# Patient Record
Sex: Male | Born: 1940
Health system: Southern US, Community
[De-identification: ages and names within clinical notes are randomized; demographics above are authoritative.]

## PROBLEM LIST (undated history)

## (undated) DIAGNOSIS — E785 Hyperlipidemia, unspecified: Secondary | ICD-10-CM

## (undated) DIAGNOSIS — I10 Essential (primary) hypertension: Secondary | ICD-10-CM

## (undated) DIAGNOSIS — E119 Type 2 diabetes mellitus without complications: Secondary | ICD-10-CM

---

## 2008-11-10 ENCOUNTER — Emergency Department: Payer: Self-pay | Admitting: Emergency Medicine

## 2009-09-13 ENCOUNTER — Ambulatory Visit: Payer: Self-pay | Admitting: Unknown Physician Specialty

## 2010-06-21 ENCOUNTER — Ambulatory Visit: Payer: Self-pay | Admitting: Anesthesiology

## 2010-07-14 ENCOUNTER — Ambulatory Visit: Payer: Self-pay | Admitting: Anesthesiology

## 2010-08-17 ENCOUNTER — Ambulatory Visit: Payer: Self-pay | Admitting: Anesthesiology

## 2010-10-26 ENCOUNTER — Ambulatory Visit: Payer: Self-pay | Admitting: Anesthesiology

## 2010-11-23 ENCOUNTER — Ambulatory Visit: Payer: Self-pay | Admitting: Anesthesiology

## 2010-12-22 ENCOUNTER — Ambulatory Visit: Payer: Self-pay | Admitting: Anesthesiology

## 2011-01-13 ENCOUNTER — Ambulatory Visit: Payer: Self-pay | Admitting: Pain Medicine

## 2011-01-26 ENCOUNTER — Ambulatory Visit: Payer: Self-pay | Admitting: Pain Medicine

## 2011-08-11 ENCOUNTER — Observation Stay: Payer: Self-pay | Admitting: Internal Medicine

## 2011-10-19 ENCOUNTER — Ambulatory Visit: Payer: Self-pay | Admitting: Pain Medicine

## 2011-11-23 ENCOUNTER — Ambulatory Visit: Payer: Self-pay | Admitting: Pain Medicine

## 2011-11-28 ENCOUNTER — Emergency Department: Payer: Self-pay | Admitting: Emergency Medicine

## 2011-11-28 LAB — DRUG SCREEN, URINE
Amphetamines, Ur Screen: NEGATIVE (ref ?–1000)
Barbiturates, Ur Screen: NEGATIVE (ref ?–200)
Cannabinoid 50 Ng, Ur ~~LOC~~: NEGATIVE (ref ?–50)
Cocaine Metabolite,Ur ~~LOC~~: NEGATIVE (ref ?–300)
MDMA (Ecstasy)Ur Screen: NEGATIVE (ref ?–500)
Opiate, Ur Screen: NEGATIVE (ref ?–300)
Phencyclidine (PCP) Ur S: NEGATIVE (ref ?–25)

## 2011-11-28 LAB — COMPREHENSIVE METABOLIC PANEL
Albumin: 4 g/dL (ref 3.4–5.0)
Alkaline Phosphatase: 68 U/L (ref 50–136)
Calcium, Total: 8.9 mg/dL (ref 8.5–10.1)
Chloride: 103 mmol/L (ref 98–107)
Creatinine: 1.1 mg/dL (ref 0.60–1.30)
Glucose: 139 mg/dL — ABNORMAL HIGH (ref 65–99)
SGOT(AST): 19 U/L (ref 15–37)
Total Protein: 7.5 g/dL (ref 6.4–8.2)

## 2011-11-28 LAB — CBC
HGB: 12.3 g/dL — ABNORMAL LOW (ref 13.0–18.0)
MCHC: 33.4 g/dL (ref 32.0–36.0)
Platelet: 377 10*3/uL (ref 150–440)
RDW: 14.9 % — ABNORMAL HIGH (ref 11.5–14.5)
WBC: 9.9 10*3/uL (ref 3.8–10.6)

## 2011-11-28 LAB — PRO B NATRIURETIC PEPTIDE: B-Type Natriuretic Peptide: 42 pg/mL (ref 0–125)

## 2011-11-28 LAB — CK TOTAL AND CKMB (NOT AT ARMC): CK, Total: 89 U/L (ref 35–232)

## 2011-11-28 LAB — TROPONIN I: Troponin-I: <0.02 ng/mL

## 2012-06-06 ENCOUNTER — Ambulatory Visit: Payer: Self-pay | Admitting: Urology

## 2012-06-11 ENCOUNTER — Ambulatory Visit: Payer: Self-pay | Admitting: Urology

## 2012-06-14 ENCOUNTER — Inpatient Hospital Stay: Payer: Self-pay | Admitting: Family Medicine

## 2012-06-14 LAB — URINALYSIS, COMPLETE
Bilirubin,UR: NEGATIVE
Ph: 6 (ref 4.5–8.0)
Protein: 100
RBC,UR: 663 /HPF (ref 0–5)
Specific Gravity: 1.017 (ref 1.003–1.030)
Squamous Epithelial: NONE SEEN

## 2012-06-14 LAB — COMPREHENSIVE METABOLIC PANEL
Albumin: 3.5 g/dL (ref 3.4–5.0)
Anion Gap: 11 (ref 7–16)
BUN: 19 mg/dL — ABNORMAL HIGH (ref 7–18)
Bilirubin,Total: 0.5 mg/dL (ref 0.2–1.0)
Creatinine: 1.43 mg/dL — ABNORMAL HIGH (ref 0.60–1.30)
EGFR (African American): 57 — ABNORMAL LOW
Glucose: 220 mg/dL — ABNORMAL HIGH (ref 65–99)
Osmolality: 285 (ref 275–301)
Potassium: 4.5 mmol/L (ref 3.5–5.1)
Sodium: 138 mmol/L (ref 136–145)
Total Protein: 7.4 g/dL (ref 6.4–8.2)

## 2012-06-14 LAB — CK TOTAL AND CKMB (NOT AT ARMC): CK-MB: <0.5 ng/mL — ABNORMAL LOW (ref 0.5–3.6)

## 2012-06-14 LAB — LIPASE, BLOOD: Lipase: 217 U/L (ref 73–393)

## 2012-06-14 LAB — CBC
HGB: 10.7 g/dL — ABNORMAL LOW (ref 13.0–18.0)
MCV: 88 fL (ref 80–100)
Platelet: 288 10*3/uL (ref 150–440)
RBC: 3.69 10*6/uL — ABNORMAL LOW (ref 4.40–5.90)
WBC: 14.2 10*3/uL — ABNORMAL HIGH (ref 3.8–10.6)

## 2012-06-15 ENCOUNTER — Ambulatory Visit: Payer: Self-pay | Admitting: Hematology and Oncology

## 2012-06-15 LAB — BASIC METABOLIC PANEL
Anion Gap: 13 (ref 7–16)
Calcium, Total: 7.4 mg/dL — ABNORMAL LOW (ref 8.5–10.1)
Chloride: 105 mmol/L (ref 98–107)
Co2: 20 mmol/L — ABNORMAL LOW (ref 21–32)
Glucose: 257 mg/dL — ABNORMAL HIGH (ref 65–99)
Osmolality: 286 (ref 275–301)
Potassium: 3.7 mmol/L (ref 3.5–5.1)
Sodium: 138 mmol/L (ref 136–145)

## 2012-06-16 ENCOUNTER — Ambulatory Visit: Payer: Self-pay | Admitting: Urology

## 2012-06-16 LAB — CBC WITH DIFFERENTIAL/PLATELET
Basophil %: 0.4 %
Eosinophil #: 0.1 10*3/uL (ref 0.0–0.7)
Eosinophil %: 1.7 %
HCT: 29.3 % — ABNORMAL LOW (ref 40.0–52.0)
HGB: 10 g/dL — ABNORMAL LOW (ref 13.0–18.0)
Lymphocyte #: 1.1 10*3/uL (ref 1.0–3.6)
MCH: 29.9 pg (ref 26.0–34.0)
MCHC: 34.1 g/dL (ref 32.0–36.0)
MCV: 88 fL (ref 80–100)
Monocyte %: 12.5 %
Neutrophil #: 5.3 10*3/uL (ref 1.4–6.5)
Platelet: 235 10*3/uL (ref 150–440)
RBC: 3.34 10*6/uL — ABNORMAL LOW (ref 4.40–5.90)
RDW: 13.1 % (ref 11.5–14.5)
WBC: 7.6 10*3/uL (ref 3.8–10.6)

## 2012-06-16 LAB — BASIC METABOLIC PANEL
Anion Gap: 8 (ref 7–16)
BUN: 10 mg/dL (ref 7–18)
Calcium, Total: 7.9 mg/dL — ABNORMAL LOW (ref 8.5–10.1)
Creatinine: 0.99 mg/dL (ref 0.60–1.30)
Glucose: 126 mg/dL — ABNORMAL HIGH (ref 65–99)
Osmolality: 276 (ref 275–301)
Potassium: 3.4 mmol/L — ABNORMAL LOW (ref 3.5–5.1)
Sodium: 138 mmol/L (ref 136–145)

## 2012-06-17 LAB — CBC WITH DIFFERENTIAL/PLATELET
Basophil #: 0 10*3/uL (ref 0.0–0.1)
Basophil %: 0.5 %
Eosinophil #: 0.1 10*3/uL (ref 0.0–0.7)
HGB: 10.1 g/dL — ABNORMAL LOW (ref 13.0–18.0)
Lymphocyte %: 24.7 %
MCHC: 35.1 g/dL (ref 32.0–36.0)
Monocyte %: 10.4 %
Neutrophil #: 4.7 10*3/uL (ref 1.4–6.5)
Neutrophil %: 62.5 %
WBC: 7.4 10*3/uL (ref 3.8–10.6)

## 2012-06-17 LAB — BASIC METABOLIC PANEL
Calcium, Total: 8.1 mg/dL — ABNORMAL LOW (ref 8.5–10.1)
Co2: 26 mmol/L (ref 21–32)
Osmolality: 285 (ref 275–301)
Potassium: 3.4 mmol/L — ABNORMAL LOW (ref 3.5–5.1)
Sodium: 141 mmol/L (ref 136–145)

## 2012-06-17 LAB — VANCOMYCIN, TROUGH: Vancomycin, Trough: 13 ug/mL (ref 10–20)

## 2012-06-18 LAB — CBC WITH DIFFERENTIAL/PLATELET
Eosinophil: 2 %
HCT: 28.5 % — ABNORMAL LOW (ref 40.0–52.0)
HGB: 8.9 g/dL — ABNORMAL LOW (ref 13.0–18.0)
Lymphocytes: 30 %
MCV: 88 fL (ref 80–100)
Monocytes: 5 %
RBC: 3.25 10*6/uL — ABNORMAL LOW (ref 4.40–5.90)
Variant Lymphocyte - H1-Rlymph: 1 %
WBC: 9.6 10*3/uL (ref 3.8–10.6)

## 2012-06-18 LAB — BASIC METABOLIC PANEL
Anion Gap: 10 (ref 7–16)
BUN: 6 mg/dL — ABNORMAL LOW (ref 7–18)
Chloride: 108 mmol/L — ABNORMAL HIGH (ref 98–107)
Creatinine: 0.93 mg/dL (ref 0.60–1.30)
Glucose: 79 mg/dL (ref 65–99)
Osmolality: 280 (ref 275–301)
Potassium: 3.6 mmol/L (ref 3.5–5.1)
Sodium: 142 mmol/L (ref 136–145)

## 2012-06-18 LAB — CULTURE, BLOOD (SINGLE)

## 2012-06-19 ENCOUNTER — Ambulatory Visit: Payer: Self-pay | Admitting: Hematology and Oncology

## 2012-06-21 LAB — CULTURE, BLOOD (SINGLE)

## 2012-07-06 ENCOUNTER — Ambulatory Visit: Payer: Self-pay | Admitting: Hematology and Oncology

## 2012-08-10 ENCOUNTER — Ambulatory Visit: Payer: Self-pay | Admitting: Hematology and Oncology

## 2012-08-17 ENCOUNTER — Ambulatory Visit: Payer: Self-pay | Admitting: Hematology and Oncology

## 2012-09-05 ENCOUNTER — Ambulatory Visit: Payer: Self-pay | Admitting: Hematology and Oncology

## 2013-02-15 ENCOUNTER — Other Ambulatory Visit: Payer: Self-pay | Admitting: Family Medicine

## 2013-02-18 ENCOUNTER — Other Ambulatory Visit: Payer: Self-pay | Admitting: Family Medicine

## 2013-05-11 ENCOUNTER — Ambulatory Visit: Payer: Self-pay | Admitting: Neurology

## 2013-08-29 ENCOUNTER — Emergency Department: Payer: Self-pay | Admitting: Emergency Medicine

## 2013-08-29 LAB — BASIC METABOLIC PANEL
BUN: 15 mg/dL (ref 7–18)
Calcium, Total: 8.5 mg/dL (ref 8.5–10.1)
Co2: 25 mmol/L (ref 21–32)
Creatinine: 1.19 mg/dL (ref 0.60–1.30)
EGFR (African American): >60
EGFR (Non-African Amer.): >60
Osmolality: 277 (ref 275–301)
Potassium: 3.7 mmol/L (ref 3.5–5.1)

## 2013-08-29 LAB — TROPONIN I: Troponin-I: <0.02 ng/mL

## 2013-08-29 LAB — CBC
HGB: 11.2 g/dL — ABNORMAL LOW (ref 13.0–18.0)
MCHC: 34.1 g/dL (ref 32.0–36.0)
MCV: 87 fL (ref 80–100)
Platelet: 311 10*3/uL (ref 150–440)
RBC: 3.78 10*6/uL — ABNORMAL LOW (ref 4.40–5.90)
WBC: 17 10*3/uL — ABNORMAL HIGH (ref 3.8–10.6)

## 2013-08-31 LAB — BETA STREP CULTURE(ARMC)

## 2013-09-03 LAB — CULTURE, BLOOD (SINGLE)

## 2014-12-15 DIAGNOSIS — E538 Deficiency of other specified B group vitamins: Secondary | ICD-10-CM | POA: Diagnosis not present

## 2014-12-15 DIAGNOSIS — I10 Essential (primary) hypertension: Secondary | ICD-10-CM | POA: Diagnosis not present

## 2014-12-15 DIAGNOSIS — E559 Vitamin D deficiency, unspecified: Secondary | ICD-10-CM | POA: Diagnosis not present

## 2014-12-15 DIAGNOSIS — E039 Hypothyroidism, unspecified: Secondary | ICD-10-CM | POA: Diagnosis not present

## 2014-12-15 DIAGNOSIS — E785 Hyperlipidemia, unspecified: Secondary | ICD-10-CM | POA: Diagnosis not present

## 2014-12-15 DIAGNOSIS — Z79899 Other long term (current) drug therapy: Secondary | ICD-10-CM | POA: Diagnosis not present

## 2014-12-15 DIAGNOSIS — E119 Type 2 diabetes mellitus without complications: Secondary | ICD-10-CM | POA: Diagnosis not present

## 2014-12-23 NOTE — H&P (Signed)
PATIENT NAME:  Brad Santiago, Brad Santiago MR#:  [id number] DATE OF BIRTH:  [birth year]  DATE OF ADMISSION:  06/11/2012  CHIEF COMPLAINT: Difficulty voiding.   HISTORY OF PRESENT ILLNESS: Brad Santiago is a 74 year old white male with a long history of benign prostatic hypertrophy and bladder outlet obstruction. He is currently being managed with Rapaflo one a day. He has not had any improvement with medical therapy. He underwent uroflow study in the office which was consistent with severe obstruction. He also has a history of elevated PSA and underwent needle biopsy May 28th which indicated a 74 gram prostate with benign histology. He comes in now for photovaporization of the prostate with green light laser. The patient underwent medical clearance for this procedure per Dr. Terance HartBronstein on August 12th.   ALLERGIES: The patient is allergic to sulfa drugs.   CURRENT MEDICATIONS:  1. Aspirin. 2. Glyburide. 3. Simvastatin. 4. Lisinopril. 5. Hydrocodone. 6. Clonazepam.  7. Sertraline. 8. Vitamin B12.   PAST SURGICAL HISTORY:  1. Right knee arthroscopy in 1999. 2. TUMT in 2007.   SOCIAL HISTORY: Denied tobacco or alcohol use.   FAMILY HISTORY: Remarkable for parents with diabetes and hypercholesterolemia.   PAST AND CURRENT MEDICAL CONDITIONS:  1. Diabetes. 2. Psoriasis. 3. Allergic rhinitis.  4. Hyperlipidemia.  5. Arthritis.  6. Chronic anemia. 7. Vitamin B12 deficiency. 8. The patient has chronic fatigue, chronic leg pain, and some shortness of breath. He also has occasional numbness in his arms and his legs. He has chronic constipation. He denied chest pain.   PHYSICAL EXAMINATION:   GENERAL: Elderly white male in no acute distress.   HEENT: Sclerae were clear. Pupils were equally round and reactive to light and accommodation. Extraocular movements were intact.   NECK: Supple without palpable adenopathy. No audible carotid bruits.   LUNGS: Clear to auscultation.   CARDIOVASCULAR: Regular rhythm  and rate without audible murmurs.   ABDOMEN: Soft. No palpable abdominal masses.   GU: The patient was uncircumcised. Testes were atrophic, 16 mL in size each.   RECTAL: Greater than 50 gram smooth, nontender prostate.   NEUROMUSCULAR: The patient was alert and oriented x3.   IMPRESSION:  1. Massive benign prostatic hypertrophy with bladder outlet obstruction.  2. Elevated PSA.   PLAN: Photovaporization of the prostate with green light laser.  ____________________________ Brad ConnersMichael R. Evelene CroonWolff, MD mrw:drc D: 06/06/2012 13:46:21 ET T: 06/06/2012 13:59:33 ET JOB#: [id number] Yecheskel Kurek R Ikeya Brockel MD ELECTRONICALLY SIGNED 06/07/2012 9:08

## 2014-12-23 NOTE — Consult Note (Signed)
         Electronic Signatures: Antony Hasteamiah, Donn Zanetti S (MD)  (Signed 14-Oct-13 10:15)  Authored: Brief Consult Note   Last Updated: 14-Oct-13 10:15 by Antony Hasteamiah, Curley Fayette S (MD)

## 2014-12-23 NOTE — Op Note (Signed)
PATIENT NAME:  Brad Santiago, Brad Santiago MR#:  [id number] DATE OF BIRTH:  01-Feb-1941  DATE OF PROCEDURE:  06/11/2012  PREOPERATIVE DIAGNOSIS: Benign prostatic hypertrophy with bladder outlet obstruction.   POSTOPERATIVE DIAGNOSIS: Benign prostatic hypertrophy with bladder outlet obstruction.  PROCEDURE PERFORMED: Photovaporization of the prostate with a green light laser.   SURGEON: Anola GurneyMichael Manvi Guilliams, Brad Santiago   ANESTHETIST: Zena AmosWilliam Kephart, Brad Santiago    ANESTHESIA: General.   INDICATIONS: See the dictated History and Physical. After informed consent, the patient requested the above procedure.   OPERATIVE SUMMARY: After adequate general anesthesia had been attained, the patient was placed into the dorsal lithotomy position, and the perineum was prepped and draped in the usual fashion. The laser scope was coupled with the camera and then visually advanced into the bladder. The bladder was moderately trabeculated. No bladder mucosal lesions were identified. Both ureteral orifices were identified and had clear efflux. The patient was noted to have trilobar benign prostatic hypertrophy. At this point, the XPS laser fiber was introduced through the scope and set at 80 watts of power. The bladder neck tissue was then vaporized. Power was then increased to 120 watts, and the prostatic tissue from the bladder neck to close to the apex was vaporized. Finally, the power was increased to 180 watts and remaining obstructive tissue vaporized from the bladder neck to the verumontanum. At this point, the scope was removed. A 20-French silicone catheter was placed and irrigated until clear. A B and O suppository was placed. The procedure was then terminated, and the patient was transferred to the recovery room in stable condition.  ____________________________ Brad ConnersMichael R. Evelene CroonWolff, Brad Santiago mrw:cbb D: 06/11/2012 11:20:34 ET T: 06/11/2012 11:32:20 ET JOB#: [id number] Brad Santiago ELECTRONICALLY SIGNED 06/11/2012 16:03

## 2014-12-23 NOTE — Consult Note (Signed)
PATIENT NAME:  Brad Santiago, Brad Santiago MR#:  [id number] DATE OF BIRTH:  [birth year]  DATE OF CONSULTATION:  06/14/2012  REFERRING PHYSICIAN:  Maryan Puls, MD of Urology CONSULTING PHYSICIAN:  Dainel Arcidiacono A. Posey Pronto, MD PRIMARY CARE PHYSICIAN: Juluis Pitch, MD   REASON FOR CONSULTATION: Management for medical problems of diabetes and hypertension.   HISTORY OF PRESENT ILLNESS: Mr. Brad Santiago is a 74 year old Caucasian gentleman with history of type 2 diabetes, hypertension, hyperlipidemia, and history of benign prostatic hypertrophy, who recently underwent outpatient procedure by Dr. Maryan Puls on 06/11/2012 where he underwent photovaporization of his prostate due to massive prostate enlargement along with bladder outlet obstruction. The patient was sent home the same day, started having nausea, vomiting, dry heaves and chills along with fever since yesterday. He complained of significant back pain, came to the Emergency Room and was found to have SIRS secondary to acute cystitis/urinary tract infection. The patient received a dose of IV Levaquin. He is admitted on Dr. Letta Kocher service. Internal Medicine was consulted for management of his medical problems. The patient in the Emergency Room was having dry heaves, improved with Zofran, now wanting to eat something and is tolerating liquids in the formal of orange juice. He received a dose of IV Levaquin. He has been complaining of some back pain. No vomiting at present.   HOME MEDICATIONS:   1. Simvastatin 20 mg p.o. daily.  2. Sertraline 50 mg daily.  3. Lisinopril 20 mg daily.  4. Glyburide 5/500, 2 tablets b.i.d.  However, the patient says recently his sugars have been running on the lower side. He is taking 1 tablet b.i.d.   5. Clonazepam 0.5 mg p.o. daily.  6. Betamethasone/Clotrimazole 0.05/1% topical cream to affected area.  7. Aspirin 81 mg daily.  8. Acetaminophen/hydrocodone 10/500, 1 tablet every 6 to 8 hours.   PAST MEDICAL/SURGICAL HISTORY:   1. Psoriasis.  2. Benign prostatic hypertrophy, status post photovaporization with green laser light secondary to massive prostate enlargement along with  bladder outlet obstruction.  3. Hypertension.  4. Hyperlipidemia.  5. Diabetes.  6. Right knee surgery.   ALLERGIES: Penicillin and sulfa.   FAMILY HISTORY: Positive for hypertension.    REVIEW OF SYSTEMS: CONSTITUTIONAL: Positive for fever, fatigue, weakness. EYES: No blurred or double vision. ENT: No tinnitus, ear pain, hearing loss. RESPIRATORY: No cough, wheeze, hemoptysis, or chronic obstructive pulmonary disease. CARDIOVASCULAR: No chest pain, orthopnea, or edema. GASTROINTESTINAL: Positive for nausea, vomiting, abdominal pain and back pain. GENITOURINARY: Positive for burning urine, positive for dysuria and frequency. HEMATOLOGY: No anemia or easy bruising. SKIN: No acne or rash. ENDOCRINE: No polyuria or nocturia. NEUROLOGICAL: No cerebrovascular accident or transient ischemic attack. Positive for weakness. PSYCHIATRIC: No anxiety or depression. All other systems reviewed and negative.   PHYSICAL EXAMINATION:  GENERAL: The patient is awake, alert, oriented x3, in mild distress due to back pain.  VITAL SIGNS: Temperature 98.7, point is 105, blood pressure 141/85, saturations are 93% to 94% on room air.   HEENT: Atraumatic, normocephalic. Pupils are equal, round, and reactive to light and accommodation. Extraocular movements are intact. Oral mucosa is dry. Facial flushing present.   NECK: Supple. No JVD. No carotid bruit.   LUNGS: Clear to auscultation bilaterally. No rales, rhonchi, respiratory distress, or labored breathing.   CARDIOVASCULAR: Both the heart sounds are normal. Rate is tachycardic. No murmur heard. PMI not lateralized. Chest is nontender.   EXTREMITIES: Good pedal pulses, good femoral pulses. No lower extremity edema.   ABDOMEN: Soft, benign, and  nontender. No organomegaly. Positive bowel sounds.  NEUROLOGIC:  Grossly intact cranial nerves II to XII. No motor or sensory deficit.   PSYCHIATRIC: The patient is awake, alert, oriented x3.   SKIN: Warm and dry.   LABORATORY, DIAGNOSTIC AND RADIOLOGICAL DATA: CT of the abdomen shows small retroperitoneal lymph nodes. PET/CT should be considered for further evaluation. Prostate enlargement, benign prostatic hypertrophy or prostatic malignancy could present in this fashion. Urinalysis positive for urinary tract infection. Glucose is 220, BUN is 19, creatinine 1.43, sodium 138, potassium 4.5, chloride 104, bicarbonate 23, calcium 8.6, alkaline phosphatase 65, hemoglobin and hematocrit 10.7 and 32.5. White count is 14.2, platelet count is 288. Cardiac enzymes are negative. Troponin is 0.02. EKG showed sinus tachycardia.   ASSESSMENT/PLAN: Mr. Brad Santiago is a 74 year old with:  1. Type 2 diabetes: We will continue sliding scale since the patient is n.p.o., is tolerating now some clear liquids and having dry heaves. We will resume p.o. medications once able to tolerate.  2. Hypertension: Resume p.o. medications except hold lisinopril. Once creatinine is back to normal resume lisinopril. We will give p.r.n. IV hydralazine if needed for elevated blood pressure.  3. Systemic inflammatory response syndrome secondary to acute cystitis/urinary tract infection, status post photovaporization of the prostate for enlarged prostate 10/07: The patient presented with fever, tachycardia, elevated white count, receiving IV fluids. Follow blood cultures, urine cultures. Management per Dr. Yves Dill.  4. Acute prerenal azotemia: Poor p.o. intake with vomiting. Creatinine at baseline is 1.1. Continue IV fluids and follow up MET-B.  5. Hyperlipidemia: On simvastatin.   6. Deep vein thrombosis prophylaxis: Will recommend heparan subcutaneous t.i.d.  7. Further work-up according to the patient's clinical course.   Thank you for the consult. We will follow while the patient is in house.   TIME  SPENT: 55 minutes.  ____________________________ Hart Rochester Posey Pronto, MD sap:cbb D: 06/14/2012 18:55:38 ET T: 06/15/2012 09:58:14 ET JOB#: [id number] cc: Youlanda Roys. Lovie Macadamia, MD Ilda Basset MD ELECTRONICALLY SIGNED 06/15/2012 21:00

## 2014-12-23 NOTE — H&P (Signed)
PATIENT NAME:  Brad Santiago, Brad Santiago DATE OF BIRTH:  1941-06-21  DATE OF ADMISSION:  06/14/2012  CHIEF COMPLAINT:  Fever, chills, and nausea.  HISTORY OF PRESENT ILLNESS: Mr. Brad Santiago is a 74 year old white male who underwent Green Light photovaporization of the prostate on 10/07. The procedure was uneventful. He was treated with Levaquin for three days perioperatively. His catheter was removed yesterday. He developed nausea and vomiting last night and today, prompting an Emergency Room visit. He has also had some fever and chills today. He reports that he is voiding with a good urinary stream and denied dysuria or gross hematuria. He has not had a bowel movement in seven days.   PAST MEDICAL HISTORY, SOCIAL HISTORY, REVIEW OF SYSTEMS:  See the History and Physical dated 06/11/2012.   PHYSICAL EXAMINATION: Abdomen was soft. Bladder was not distended. No CVA tenderness.  CT scan films and report were reviewed.   IMPRESSION:  1. Fever and chills of uncertain etiology.  2. Diabetes.  3. Benign prostatic hypertrophy with bladder outlet obstruction status post Burna MortimerGreen Light photovaporization of the prostate 10/07.  PLAN:  1. Admit for IV fluids and antibiotics.  2. Obtain blood cultures and urine culture.  3. Consult internal medicine to help manage diabetes and the possibility of bowel movement problems.   ____________________________ Suszanne ConnersMichael R. Evelene CroonWolff, MD mrw:bjt D:  06/14/2012 18:31:43 ET         T: 06/15/2012 06:08:18 ET         JOB#: [id number] Brad Pascua R Djibril Glogowski MD ELECTRONICALLY SIGNED 06/15/2012 9:03

## 2014-12-23 NOTE — Consult Note (Signed)
PCP Dr Mitchell HeirBronsteindr Wolf Dm-2cont SSI since pt is NPO and having dry heavespo meds once able to tolerate HTNpo meds except hold lisinopril S/p Photovaporaization of prostate 10/7 now with UTI/acute cyystitis and SIRStachycardia, elevated WBCdr wolf re-renal azotemiapo intake, vomiting1.1 baseline HL pt and son    Electronic Signatures: Willow OraPatel, Kimberly Nieland A (MD) (Signed on 10-Oct-13 18:29)  Authored   Last Updated: 10-Oct-13 18:31 by Willow OraPatel, Mckinnley Cottier A (MD)

## 2014-12-23 NOTE — Consult Note (Signed)
PATIENT NAME:  Brad Santiago, Brad Santiago MR#:  [id number] DATE OF BIRTH:  [birth year]  DATE OF CONSULTATION:  06/15/2012  REFERRING PHYSICIAN:  Anola Gurney, MD  CONSULTING PHYSICIAN:  Demetrius Charity. Wendie Simmer, MD  PRIMARY CARE PHYSICIAN: Dorothey Baseman, MD   REASON FOR CONSULTATION: Retroperitoneal lymphadenopathy.   HISTORY OF PRESENT ILLNESS: Brad Santiago is a 74 year old Caucasian gentleman with a history of type II diabetes, hypertension, hyperlipidemia, as well as a history of benign prostatic hypertrophy. The patient underwent an outpatient procedure by Dr. Evelene Croon on 06/11/2012 where he underwent a photovaporization of his prostate due to massive prostate enlargement along with bladder outlet obstruction. The patient was discharged home the same day. At home he started having nausea, vomiting, dry heaves, as well as chills and fever with significant back pain. He went to the Emergency Department at [facility] and was found to be in SIRS secondary to acute cystitis/urinary tract infection. The patient received a dose of intravenous Levaquin and was admitted to Dr. Amada Jupiter service. Due to his abdominal pain, a CT scan of the abdomen and pelvis was done on 06/14/2012. This showed small retroperitoneal lymph nodes, the largest measuring 1.3 cm. The liver was normal. Pancreas normal with some splenic granuloma noted. Also noted was prostate enlargement. Blood cultures on 10/10 were positive for Staphylococcus hominis.   PAST MEDICAL HISTORY: As mentioned in history of present illness and also: 1. Psoriasis. 2. Hypertension. 3. Hyperlipidemia. 4. Diabetes. 5. Benign prostatic hypertrophy, status post photovaporization.   ALLERGIES: Allergic to penicillin and sulfa.  FAMILY HISTORY: Positive for hypertension.   HOME MEDICATIONS:  1. Simvastatin 20 daily. 2. Sertraline 50 daily. 3. Lisinopril 20 daily. 4. Glyburide 5/500 two b.i.d.  5. Clonazepam 0.5 daily. 6. Betamethasone/clotrimazole  topical cream.  7. Aspirin 81 daily.  8. Acetaminophen/hydrocodone 10/500 one tablet every six to eight hours.   REVIEW OF SYSTEMS: As mentioned in history of present illness.   PHYSICAL EXAMINATION:   VITAL SIGNS: Temperature 37.9, blood pressure 127/70, pulse rate 79, respiration rate 18, pulse oximetry 96% on room air.   GENERAL: The patient is comfortable in no acute distress, lying comfortably in bed.   HEENT: Pale conjunctivae. Sclerae anicteric.   NECK: Supple. No masses noted.  RESPIRATORY: Clear to auscultation bilaterally. No adventitious sounds heard.   CARDIAC: Regular rate and rhythm. No murmurs, rubs, or gallops noted.   ABDOMEN: Soft, slightly tender diffusely. Normal bowel sounds.   EXTREMITIES: No clubbing, cyanosis, or edema noted.   NEUROLOGIC: Cranial nerves II through XII intact. No focal neurological deficits noted.   SKIN: Normal. No skin defects noted.   PSYCH: Oriented to time, place, and person.   LABORATORY, DIAGNOSTIC, AND RADIOLOGICAL DATA: BMP on 06/15/2012 showed glucose 257, BUN 16, creatinine 1.38, sodium 138, potassium 3.7, chloride 105, CO2 20, calcium 7.4, anion gap 13, osmolality 286.   CT scan findings showed small retroperitoneal lymph nodes. PET CT should be considered for further evaluation. Prostate enlargement. BPH or prostate malignancy could present this fashion. No acute abnormality.   IMPRESSION AND PLAN: This is a 74 year old gentleman admitted status post photovaporization of his prostate, admitted with sepsis secondary to UTI cystitis currently on intravenous antibiotic therapy. CT scan did show retroperitoneal lymph nodes. At this point impression is that these might be reactive to sepsis and cystitis. Would recommend repeating CT scan in 4 to 6 weeks and if nodes are not resolved will consider further workup/PET CT or biopsy at that time. This was discussed  with patient and his family and they are in agreement.    ____________________________ Demetrius CharityVeshana S. Wendie Simmeramiah, MD vsr:drc D: 06/18/2012 10:31:34 ET T: 06/18/2012 10:56:51 ET JOB#: [id number]  cc: Demetrius CharityVeshana S. Wendie Simmeramiah, MD, <Dictator> Antony HasteVESHANA S Nelta Caudill MD ELECTRONICALLY SIGNED 06/20/2012 10:37

## 2014-12-23 NOTE — Consult Note (Signed)
Brief Consult Note: Diagnosis: retroperitoneal LN.   Patient was seen by consultant.   Discussed with Attending MD.   Comments: Discussed with Dr Angela AdamWolffe Retroperitoneal lymphadenopathy may be related to current infection and to prostate surgery. Would recommend repeat CT scan in 4-6 weeks and if persisitent lymphadenopathy will plan on further workup at that time.  Electronic Signatures: Antony Hasteamiah, Izaac Reisig S (MD)  (Signed 11-Oct-13 17:37)  Authored: Brief Consult Note   Last Updated: 11-Oct-13 17:37 by Antony Hasteamiah, Areona Homer S (MD)

## 2014-12-23 NOTE — Discharge Summary (Signed)
PATIENT NAME:  Brad Santiago, Brad J MR#:  [id number] DATE OF BIRTH:  1941-04-08  DATE OF ADMISSION:  06/14/2012 DATE OF DISCHARGE:  06/18/2012  NOTE: Patient is being discharged by Dr. Evelene CroonWolff. I am just writing this discharge summary as a courtesy and for follow up for the primary care physician.    REASON FOR ADMISSION: Fever, chills and nausea.   FINAL DIAGNOSES:  1. Staphylococcus hominis bacteremia. 2. Escherichia coli urinary tract infection with Staphylococcus hominis urinary tract infection.  3. Systemic inflammatory response syndrome.  4. Acute cystitis urinary tract infection.  5. Status post photovaporization of the prostate on 06/11/2012.  6. Acute prerenal azotemia/acute kidney injury.  7. Hyperlipidemia.  8. Diabetes.  9. Hypertension.   DISPOSITION: Home.   MEDICATIONS AT DISCHARGE:  1. Aspirin 81 mg daily. 2. Glyburide metformin 5/500, 2 tablets 2 times daily. 3. Clonazepam 0.5 mg at bedtime. 4. Lisinopril 20 mg once daily.  5. Simvastatin 20 mg once daily.  6. Sertraline 50 mg once daily.  7. Acetaminophen hydrocodone every six hours p.r.n. pain. 8. Cyanocobalamin 100 mcg once daily. 9. Nucynta 50 mg every 4 to 6 hours p.r.n. pain.  10. Cephalexin 500 mg every eight hours for five days. 11. Cleocin 150 mg 4 times a day for nine days.  12. Lactobacillus acidophilus 1 p.o. t.i.d. after meals.    HOSPITAL COURSE: Mr. Brad Santiago is a very nice 74 year old gentleman came on 06/14/2012 complaining of fever, chills, feeling really bad. He has history of hypertension, type 2 diabetes, hyperlipidemia, and benign prostatic hypertrophy. He underwent a procedure by Dr. Anola GurneyMichael Wolff on 06/11/2012, a photovaporization of the prostate due to massive prostate enlargement with bladder outlet obstruction. The patient was discharged home in good condition but came back complaining of significant nausea, dry heaves, fever, chills since the day prior of admission. Patient was seen in emergency and he  was diagnosed with systemic inflammatory response syndrome secondary to acute cystitis and urinary tract infection. The patient was started on Levaquin and admitted to Dr. Amada JupiterWolff's service.   Patient was hydrated with IV  fluids and he was put on a high IV antibiotics. His blood pressure was in the 140s/80s. He was never significantly hypertensive.   After blood cultures were drawn, urine culture was done we found out the patient had Escherichia coli and Staphylococcus hominis urinary tract infection and he had Staphylococcus hominis bacteremia.   His Staphylococcus hominis was sensitive to tetracycline, clindamycin but resistant to oxacillin, Levaquin, ciprofloxacin and erythromycin. It was sensitive to vancomycin. The Escherichia coli was sensitive to cefazolin or any other cephalosporins, ceftriaxone but it was resistant to quinolones. It is also sensitive to trimethoprim sulfamethoxazole. The patient improved significantly and he was discharged on 10/114/2013 in good condition. He is going to be taking clindamycin and Keflex and he has been told about the effects of the clindamycin and the possibility of developing Clostridium difficile diarrhea. If he develops diarrhea he needs to go to see his primary care doctor right away or come to the ER if it is significant or if he has significant fevers. He is going to be taking lactobacillus.   TIME SPENT: I spent 50 minutes with this patient today.  ____________________________ Felipa Furnaceoberto Sanchez Gutierrez, MD rsg:cms D: 06/18/2012 11:05:39 ET T: 06/18/2012 11:17:49 ET JOB#: [id number]  cc: Felipa Furnaceoberto Sanchez Gutierrez, MD, <Dictator> Herchel Hopkin Juanda ChanceSANCHEZ GUTIERRE MD ELECTRONICALLY SIGNED 06/19/2012 16:54

## 2014-12-23 NOTE — Consult Note (Signed)
c  Electronic Signatures: Antony Hasteamiah, Lena Gores S (MD)  (Signed on 14-Oct-13 09:32)  Authored  Last Updated: 14-Oct-13 09:32 by Antony Hasteamiah, Sharanda Shinault S (MD)

## 2015-03-20 DIAGNOSIS — H43811 Vitreous degeneration, right eye: Secondary | ICD-10-CM | POA: Diagnosis not present

## 2015-03-20 DIAGNOSIS — H269 Unspecified cataract: Secondary | ICD-10-CM | POA: Diagnosis not present

## 2015-03-20 DIAGNOSIS — E119 Type 2 diabetes mellitus without complications: Secondary | ICD-10-CM | POA: Diagnosis not present

## 2015-03-20 DIAGNOSIS — Z0389 Encounter for observation for other suspected diseases and conditions ruled out: Secondary | ICD-10-CM | POA: Diagnosis not present

## 2015-03-26 DIAGNOSIS — E039 Hypothyroidism, unspecified: Secondary | ICD-10-CM | POA: Diagnosis not present

## 2015-03-26 DIAGNOSIS — I10 Essential (primary) hypertension: Secondary | ICD-10-CM | POA: Diagnosis not present

## 2015-03-26 DIAGNOSIS — E538 Deficiency of other specified B group vitamins: Secondary | ICD-10-CM | POA: Diagnosis not present

## 2015-03-26 DIAGNOSIS — E119 Type 2 diabetes mellitus without complications: Secondary | ICD-10-CM | POA: Diagnosis not present

## 2015-03-30 DIAGNOSIS — I10 Essential (primary) hypertension: Secondary | ICD-10-CM | POA: Diagnosis not present

## 2015-03-30 DIAGNOSIS — E785 Hyperlipidemia, unspecified: Secondary | ICD-10-CM | POA: Diagnosis not present

## 2015-03-30 DIAGNOSIS — E119 Type 2 diabetes mellitus without complications: Secondary | ICD-10-CM | POA: Diagnosis not present

## 2015-03-30 DIAGNOSIS — E039 Hypothyroidism, unspecified: Secondary | ICD-10-CM | POA: Diagnosis not present

## 2015-04-15 DIAGNOSIS — M6281 Muscle weakness (generalized): Secondary | ICD-10-CM | POA: Diagnosis not present

## 2015-05-05 DIAGNOSIS — R509 Fever, unspecified: Secondary | ICD-10-CM | POA: Diagnosis not present

## 2015-05-05 DIAGNOSIS — R05 Cough: Secondary | ICD-10-CM | POA: Diagnosis not present

## 2015-05-05 DIAGNOSIS — R638 Other symptoms and signs concerning food and fluid intake: Secondary | ICD-10-CM | POA: Diagnosis not present

## 2015-06-03 ENCOUNTER — Other Ambulatory Visit: Payer: Self-pay | Admitting: Family Medicine

## 2015-06-03 DIAGNOSIS — M79604 Pain in right leg: Secondary | ICD-10-CM | POA: Diagnosis not present

## 2015-06-03 DIAGNOSIS — M79605 Pain in left leg: Principal | ICD-10-CM

## 2015-06-11 ENCOUNTER — Ambulatory Visit
Admission: RE | Admit: 2015-06-11 | Discharge: 2015-06-11 | Disposition: A | Discharge status: Home / Self Care | Payer: Commercial Managed Care - HMO | Source: Ambulatory Visit | Attending: Family Medicine | Admitting: Family Medicine

## 2015-06-11 DIAGNOSIS — M4806 Spinal stenosis, lumbar region: Secondary | ICD-10-CM | POA: Diagnosis not present

## 2015-06-11 DIAGNOSIS — M79605 Pain in left leg: Secondary | ICD-10-CM | POA: Diagnosis present

## 2015-06-11 DIAGNOSIS — M79604 Pain in right leg: Secondary | ICD-10-CM

## 2015-06-11 DIAGNOSIS — M2578 Osteophyte, vertebrae: Secondary | ICD-10-CM | POA: Diagnosis not present

## 2015-06-11 DIAGNOSIS — M5136 Other intervertebral disc degeneration, lumbar region: Secondary | ICD-10-CM | POA: Insufficient documentation

## 2015-07-07 DIAGNOSIS — Z23 Encounter for immunization: Secondary | ICD-10-CM | POA: Diagnosis not present

## 2015-07-08 DIAGNOSIS — Z23 Encounter for immunization: Secondary | ICD-10-CM | POA: Diagnosis not present

## 2015-07-20 DIAGNOSIS — M4806 Spinal stenosis, lumbar region: Secondary | ICD-10-CM | POA: Diagnosis not present

## 2015-07-20 DIAGNOSIS — M4316 Spondylolisthesis, lumbar region: Secondary | ICD-10-CM | POA: Diagnosis not present

## 2015-07-27 DIAGNOSIS — I10 Essential (primary) hypertension: Secondary | ICD-10-CM | POA: Diagnosis not present

## 2015-07-27 DIAGNOSIS — M545 Low back pain: Secondary | ICD-10-CM | POA: Diagnosis not present

## 2015-07-27 DIAGNOSIS — E785 Hyperlipidemia, unspecified: Secondary | ICD-10-CM | POA: Diagnosis not present

## 2015-07-27 DIAGNOSIS — E039 Hypothyroidism, unspecified: Secondary | ICD-10-CM | POA: Diagnosis not present

## 2015-07-27 DIAGNOSIS — E119 Type 2 diabetes mellitus without complications: Secondary | ICD-10-CM | POA: Diagnosis not present

## 2016-01-08 DIAGNOSIS — H43811 Vitreous degeneration, right eye: Secondary | ICD-10-CM | POA: Diagnosis not present

## 2016-01-08 DIAGNOSIS — H2513 Age-related nuclear cataract, bilateral: Secondary | ICD-10-CM | POA: Diagnosis not present

## 2016-01-27 DIAGNOSIS — E785 Hyperlipidemia, unspecified: Secondary | ICD-10-CM | POA: Diagnosis not present

## 2016-01-27 DIAGNOSIS — I10 Essential (primary) hypertension: Secondary | ICD-10-CM | POA: Diagnosis not present

## 2016-01-27 DIAGNOSIS — M5442 Lumbago with sciatica, left side: Secondary | ICD-10-CM | POA: Diagnosis not present

## 2016-01-27 DIAGNOSIS — E039 Hypothyroidism, unspecified: Secondary | ICD-10-CM | POA: Diagnosis not present

## 2016-01-27 DIAGNOSIS — M5441 Lumbago with sciatica, right side: Secondary | ICD-10-CM | POA: Diagnosis not present

## 2016-01-27 DIAGNOSIS — E119 Type 2 diabetes mellitus without complications: Secondary | ICD-10-CM | POA: Diagnosis not present

## 2016-02-03 DIAGNOSIS — E039 Hypothyroidism, unspecified: Secondary | ICD-10-CM | POA: Diagnosis not present

## 2016-02-03 DIAGNOSIS — I10 Essential (primary) hypertension: Secondary | ICD-10-CM | POA: Diagnosis not present

## 2016-02-03 DIAGNOSIS — Z01818 Encounter for other preprocedural examination: Secondary | ICD-10-CM | POA: Diagnosis not present

## 2016-02-03 DIAGNOSIS — F329 Major depressive disorder, single episode, unspecified: Secondary | ICD-10-CM | POA: Diagnosis not present

## 2016-02-03 DIAGNOSIS — E119 Type 2 diabetes mellitus without complications: Secondary | ICD-10-CM | POA: Diagnosis not present

## 2016-02-03 DIAGNOSIS — K219 Gastro-esophageal reflux disease without esophagitis: Secondary | ICD-10-CM | POA: Diagnosis not present

## 2016-02-18 DIAGNOSIS — Z7984 Long term (current) use of oral hypoglycemic drugs: Secondary | ICD-10-CM | POA: Diagnosis not present

## 2016-02-18 DIAGNOSIS — E785 Hyperlipidemia, unspecified: Secondary | ICD-10-CM | POA: Diagnosis not present

## 2016-02-18 DIAGNOSIS — H269 Unspecified cataract: Secondary | ICD-10-CM | POA: Diagnosis not present

## 2016-02-18 DIAGNOSIS — Z79899 Other long term (current) drug therapy: Secondary | ICD-10-CM | POA: Diagnosis not present

## 2016-02-18 DIAGNOSIS — Z882 Allergy status to sulfonamides status: Secondary | ICD-10-CM | POA: Diagnosis not present

## 2016-02-18 DIAGNOSIS — E1136 Type 2 diabetes mellitus with diabetic cataract: Secondary | ICD-10-CM | POA: Diagnosis not present

## 2016-02-18 DIAGNOSIS — E039 Hypothyroidism, unspecified: Secondary | ICD-10-CM | POA: Diagnosis not present

## 2016-02-18 DIAGNOSIS — H2512 Age-related nuclear cataract, left eye: Secondary | ICD-10-CM | POA: Diagnosis not present

## 2016-02-18 DIAGNOSIS — N400 Enlarged prostate without lower urinary tract symptoms: Secondary | ICD-10-CM | POA: Diagnosis not present

## 2016-02-18 DIAGNOSIS — I10 Essential (primary) hypertension: Secondary | ICD-10-CM | POA: Diagnosis not present

## 2016-02-18 DIAGNOSIS — Z7982 Long term (current) use of aspirin: Secondary | ICD-10-CM | POA: Diagnosis not present

## 2016-06-21 DIAGNOSIS — Z23 Encounter for immunization: Secondary | ICD-10-CM | POA: Diagnosis not present

## 2016-08-08 DIAGNOSIS — Z125 Encounter for screening for malignant neoplasm of prostate: Secondary | ICD-10-CM | POA: Diagnosis not present

## 2016-08-08 DIAGNOSIS — F419 Anxiety disorder, unspecified: Secondary | ICD-10-CM | POA: Diagnosis not present

## 2016-08-08 DIAGNOSIS — Z23 Encounter for immunization: Secondary | ICD-10-CM | POA: Diagnosis not present

## 2016-08-08 DIAGNOSIS — Z0000 Encounter for general adult medical examination without abnormal findings: Secondary | ICD-10-CM | POA: Diagnosis not present

## 2016-08-08 DIAGNOSIS — E349 Endocrine disorder, unspecified: Secondary | ICD-10-CM | POA: Diagnosis not present

## 2016-08-08 DIAGNOSIS — E785 Hyperlipidemia, unspecified: Secondary | ICD-10-CM | POA: Diagnosis not present

## 2016-08-08 DIAGNOSIS — E119 Type 2 diabetes mellitus without complications: Secondary | ICD-10-CM | POA: Diagnosis not present

## 2016-08-08 DIAGNOSIS — I10 Essential (primary) hypertension: Secondary | ICD-10-CM | POA: Diagnosis not present

## 2016-08-08 DIAGNOSIS — E039 Hypothyroidism, unspecified: Secondary | ICD-10-CM | POA: Diagnosis not present

## 2017-02-06 DIAGNOSIS — M5442 Lumbago with sciatica, left side: Secondary | ICD-10-CM | POA: Diagnosis not present

## 2017-02-06 DIAGNOSIS — M5441 Lumbago with sciatica, right side: Secondary | ICD-10-CM | POA: Diagnosis not present

## 2017-02-06 DIAGNOSIS — I10 Essential (primary) hypertension: Secondary | ICD-10-CM | POA: Diagnosis not present

## 2017-02-06 DIAGNOSIS — E039 Hypothyroidism, unspecified: Secondary | ICD-10-CM | POA: Diagnosis not present

## 2017-02-06 DIAGNOSIS — E785 Hyperlipidemia, unspecified: Secondary | ICD-10-CM | POA: Diagnosis not present

## 2017-02-06 DIAGNOSIS — E119 Type 2 diabetes mellitus without complications: Secondary | ICD-10-CM | POA: Diagnosis not present

## 2017-06-23 DIAGNOSIS — Z79899 Other long term (current) drug therapy: Secondary | ICD-10-CM | POA: Diagnosis not present

## 2017-06-23 DIAGNOSIS — E039 Hypothyroidism, unspecified: Secondary | ICD-10-CM | POA: Diagnosis not present

## 2017-06-23 DIAGNOSIS — K219 Gastro-esophageal reflux disease without esophagitis: Secondary | ICD-10-CM | POA: Diagnosis not present

## 2017-06-23 DIAGNOSIS — Z23 Encounter for immunization: Secondary | ICD-10-CM | POA: Diagnosis not present

## 2017-06-23 DIAGNOSIS — E119 Type 2 diabetes mellitus without complications: Secondary | ICD-10-CM | POA: Diagnosis not present

## 2017-06-23 DIAGNOSIS — I10 Essential (primary) hypertension: Secondary | ICD-10-CM | POA: Diagnosis not present

## 2017-06-23 DIAGNOSIS — E785 Hyperlipidemia, unspecified: Secondary | ICD-10-CM | POA: Diagnosis not present

## 2017-06-23 DIAGNOSIS — F419 Anxiety disorder, unspecified: Secondary | ICD-10-CM | POA: Diagnosis not present

## 2017-08-23 DIAGNOSIS — M48061 Spinal stenosis, lumbar region without neurogenic claudication: Secondary | ICD-10-CM | POA: Diagnosis not present

## 2017-08-23 DIAGNOSIS — G8929 Other chronic pain: Secondary | ICD-10-CM | POA: Diagnosis not present

## 2017-08-23 DIAGNOSIS — Z882 Allergy status to sulfonamides status: Secondary | ICD-10-CM | POA: Diagnosis not present

## 2017-08-23 DIAGNOSIS — E785 Hyperlipidemia, unspecified: Secondary | ICD-10-CM | POA: Diagnosis not present

## 2017-08-23 DIAGNOSIS — M5117 Intervertebral disc disorders with radiculopathy, lumbosacral region: Secondary | ICD-10-CM | POA: Diagnosis not present

## 2017-08-23 DIAGNOSIS — M5442 Lumbago with sciatica, left side: Secondary | ICD-10-CM | POA: Diagnosis not present

## 2017-08-23 DIAGNOSIS — M5441 Lumbago with sciatica, right side: Secondary | ICD-10-CM | POA: Diagnosis not present

## 2017-08-23 DIAGNOSIS — M5137 Other intervertebral disc degeneration, lumbosacral region: Secondary | ICD-10-CM | POA: Diagnosis not present

## 2017-08-23 DIAGNOSIS — M47817 Spondylosis without myelopathy or radiculopathy, lumbosacral region: Secondary | ICD-10-CM | POA: Diagnosis not present

## 2017-08-23 DIAGNOSIS — M4726 Other spondylosis with radiculopathy, lumbar region: Secondary | ICD-10-CM | POA: Diagnosis not present

## 2017-08-23 DIAGNOSIS — M4800 Spinal stenosis, site unspecified: Secondary | ICD-10-CM | POA: Diagnosis not present

## 2017-08-23 DIAGNOSIS — I10 Essential (primary) hypertension: Secondary | ICD-10-CM | POA: Diagnosis not present

## 2017-08-23 DIAGNOSIS — Z7982 Long term (current) use of aspirin: Secondary | ICD-10-CM | POA: Diagnosis not present

## 2017-08-23 DIAGNOSIS — E039 Hypothyroidism, unspecified: Secondary | ICD-10-CM | POA: Diagnosis not present

## 2017-08-23 DIAGNOSIS — E119 Type 2 diabetes mellitus without complications: Secondary | ICD-10-CM | POA: Diagnosis not present

## 2017-08-23 DIAGNOSIS — M5416 Radiculopathy, lumbar region: Secondary | ICD-10-CM | POA: Diagnosis not present

## 2017-08-30 DIAGNOSIS — M5441 Lumbago with sciatica, right side: Secondary | ICD-10-CM | POA: Diagnosis not present

## 2017-08-30 DIAGNOSIS — M48061 Spinal stenosis, lumbar region without neurogenic claudication: Secondary | ICD-10-CM | POA: Diagnosis not present

## 2017-08-30 DIAGNOSIS — M4726 Other spondylosis with radiculopathy, lumbar region: Secondary | ICD-10-CM | POA: Diagnosis not present

## 2017-09-14 DIAGNOSIS — M5442 Lumbago with sciatica, left side: Secondary | ICD-10-CM | POA: Diagnosis not present

## 2017-09-14 DIAGNOSIS — M5416 Radiculopathy, lumbar region: Secondary | ICD-10-CM | POA: Diagnosis not present

## 2017-09-14 DIAGNOSIS — M4800 Spinal stenosis, site unspecified: Secondary | ICD-10-CM | POA: Diagnosis not present

## 2017-09-14 DIAGNOSIS — G8929 Other chronic pain: Secondary | ICD-10-CM | POA: Diagnosis not present

## 2017-09-14 DIAGNOSIS — M5441 Lumbago with sciatica, right side: Secondary | ICD-10-CM | POA: Diagnosis not present

## 2017-10-09 DIAGNOSIS — M48061 Spinal stenosis, lumbar region without neurogenic claudication: Secondary | ICD-10-CM | POA: Diagnosis not present

## 2017-10-09 DIAGNOSIS — M48062 Spinal stenosis, lumbar region with neurogenic claudication: Secondary | ICD-10-CM | POA: Diagnosis not present

## 2017-10-09 DIAGNOSIS — M5416 Radiculopathy, lumbar region: Secondary | ICD-10-CM | POA: Diagnosis not present

## 2017-10-09 DIAGNOSIS — M4316 Spondylolisthesis, lumbar region: Secondary | ICD-10-CM | POA: Diagnosis not present

## 2017-10-19 DIAGNOSIS — M5416 Radiculopathy, lumbar region: Secondary | ICD-10-CM | POA: Diagnosis not present

## 2017-11-03 DIAGNOSIS — M5416 Radiculopathy, lumbar region: Secondary | ICD-10-CM | POA: Diagnosis not present

## 2017-11-03 DIAGNOSIS — M48062 Spinal stenosis, lumbar region with neurogenic claudication: Secondary | ICD-10-CM | POA: Diagnosis not present

## 2017-11-13 DIAGNOSIS — Z125 Encounter for screening for malignant neoplasm of prostate: Secondary | ICD-10-CM | POA: Diagnosis not present

## 2017-11-13 DIAGNOSIS — E785 Hyperlipidemia, unspecified: Secondary | ICD-10-CM | POA: Diagnosis not present

## 2017-11-13 DIAGNOSIS — E039 Hypothyroidism, unspecified: Secondary | ICD-10-CM | POA: Diagnosis not present

## 2017-11-13 DIAGNOSIS — E119 Type 2 diabetes mellitus without complications: Secondary | ICD-10-CM | POA: Diagnosis not present

## 2017-11-13 DIAGNOSIS — N289 Disorder of kidney and ureter, unspecified: Secondary | ICD-10-CM | POA: Diagnosis not present

## 2017-11-13 DIAGNOSIS — Z0000 Encounter for general adult medical examination without abnormal findings: Secondary | ICD-10-CM | POA: Diagnosis not present

## 2017-11-13 DIAGNOSIS — Z01818 Encounter for other preprocedural examination: Secondary | ICD-10-CM | POA: Diagnosis not present

## 2017-11-13 DIAGNOSIS — F419 Anxiety disorder, unspecified: Secondary | ICD-10-CM | POA: Diagnosis not present

## 2017-11-13 DIAGNOSIS — I10 Essential (primary) hypertension: Secondary | ICD-10-CM | POA: Diagnosis not present

## 2017-12-04 DIAGNOSIS — M48062 Spinal stenosis, lumbar region with neurogenic claudication: Secondary | ICD-10-CM | POA: Diagnosis not present

## 2017-12-04 DIAGNOSIS — M5416 Radiculopathy, lumbar region: Secondary | ICD-10-CM | POA: Diagnosis not present

## 2017-12-19 DIAGNOSIS — M4316 Spondylolisthesis, lumbar region: Secondary | ICD-10-CM | POA: Diagnosis not present

## 2017-12-19 DIAGNOSIS — M5116 Intervertebral disc disorders with radiculopathy, lumbar region: Secondary | ICD-10-CM | POA: Diagnosis not present

## 2017-12-19 DIAGNOSIS — E785 Hyperlipidemia, unspecified: Secondary | ICD-10-CM | POA: Diagnosis not present

## 2017-12-19 DIAGNOSIS — N400 Enlarged prostate without lower urinary tract symptoms: Secondary | ICD-10-CM | POA: Diagnosis not present

## 2017-12-19 DIAGNOSIS — Z4789 Encounter for other orthopedic aftercare: Secondary | ICD-10-CM | POA: Diagnosis not present

## 2017-12-19 DIAGNOSIS — R5381 Other malaise: Secondary | ICD-10-CM | POA: Diagnosis not present

## 2017-12-19 DIAGNOSIS — Z882 Allergy status to sulfonamides status: Secondary | ICD-10-CM | POA: Diagnosis not present

## 2017-12-19 DIAGNOSIS — E119 Type 2 diabetes mellitus without complications: Secondary | ICD-10-CM | POA: Diagnosis not present

## 2017-12-19 DIAGNOSIS — M6281 Muscle weakness (generalized): Secondary | ICD-10-CM | POA: Diagnosis not present

## 2017-12-19 DIAGNOSIS — I10 Essential (primary) hypertension: Secondary | ICD-10-CM | POA: Diagnosis not present

## 2017-12-19 DIAGNOSIS — Z981 Arthrodesis status: Secondary | ICD-10-CM | POA: Diagnosis not present

## 2017-12-19 DIAGNOSIS — M48062 Spinal stenosis, lumbar region with neurogenic claudication: Secondary | ICD-10-CM | POA: Diagnosis not present

## 2017-12-19 DIAGNOSIS — E039 Hypothyroidism, unspecified: Secondary | ICD-10-CM | POA: Diagnosis not present

## 2017-12-19 DIAGNOSIS — M5416 Radiculopathy, lumbar region: Secondary | ICD-10-CM | POA: Diagnosis not present

## 2017-12-19 DIAGNOSIS — Z7982 Long term (current) use of aspirin: Secondary | ICD-10-CM | POA: Diagnosis not present

## 2017-12-19 DIAGNOSIS — R0902 Hypoxemia: Secondary | ICD-10-CM | POA: Diagnosis not present

## 2017-12-19 DIAGNOSIS — K219 Gastro-esophageal reflux disease without esophagitis: Secondary | ICD-10-CM | POA: Diagnosis not present

## 2017-12-19 DIAGNOSIS — R279 Unspecified lack of coordination: Secondary | ICD-10-CM | POA: Diagnosis not present

## 2017-12-19 DIAGNOSIS — F329 Major depressive disorder, single episode, unspecified: Secondary | ICD-10-CM | POA: Diagnosis not present

## 2017-12-28 DIAGNOSIS — E785 Hyperlipidemia, unspecified: Secondary | ICD-10-CM | POA: Diagnosis not present

## 2017-12-28 DIAGNOSIS — R5381 Other malaise: Secondary | ICD-10-CM | POA: Diagnosis not present

## 2017-12-28 DIAGNOSIS — E119 Type 2 diabetes mellitus without complications: Secondary | ICD-10-CM | POA: Diagnosis not present

## 2017-12-28 DIAGNOSIS — Z4789 Encounter for other orthopedic aftercare: Secondary | ICD-10-CM | POA: Diagnosis not present

## 2017-12-28 DIAGNOSIS — Z09 Encounter for follow-up examination after completed treatment for conditions other than malignant neoplasm: Secondary | ICD-10-CM | POA: Diagnosis not present

## 2017-12-28 DIAGNOSIS — Z981 Arthrodesis status: Secondary | ICD-10-CM | POA: Diagnosis not present

## 2017-12-28 DIAGNOSIS — M5137 Other intervertebral disc degeneration, lumbosacral region: Secondary | ICD-10-CM | POA: Diagnosis not present

## 2017-12-28 DIAGNOSIS — R279 Unspecified lack of coordination: Secondary | ICD-10-CM | POA: Diagnosis not present

## 2017-12-28 DIAGNOSIS — N400 Enlarged prostate without lower urinary tract symptoms: Secondary | ICD-10-CM | POA: Diagnosis not present

## 2017-12-28 DIAGNOSIS — M6281 Muscle weakness (generalized): Secondary | ICD-10-CM | POA: Diagnosis not present

## 2017-12-28 DIAGNOSIS — M48062 Spinal stenosis, lumbar region with neurogenic claudication: Secondary | ICD-10-CM | POA: Diagnosis not present

## 2017-12-28 DIAGNOSIS — M5416 Radiculopathy, lumbar region: Secondary | ICD-10-CM | POA: Diagnosis not present

## 2017-12-28 DIAGNOSIS — M4316 Spondylolisthesis, lumbar region: Secondary | ICD-10-CM | POA: Diagnosis not present

## 2017-12-28 DIAGNOSIS — R195 Other fecal abnormalities: Secondary | ICD-10-CM | POA: Diagnosis not present

## 2017-12-28 DIAGNOSIS — I10 Essential (primary) hypertension: Secondary | ICD-10-CM | POA: Diagnosis not present

## 2017-12-28 DIAGNOSIS — M549 Dorsalgia, unspecified: Secondary | ICD-10-CM | POA: Diagnosis not present

## 2017-12-28 DIAGNOSIS — F329 Major depressive disorder, single episode, unspecified: Secondary | ICD-10-CM | POA: Diagnosis not present

## 2017-12-29 DIAGNOSIS — M48062 Spinal stenosis, lumbar region with neurogenic claudication: Secondary | ICD-10-CM | POA: Diagnosis not present

## 2017-12-29 DIAGNOSIS — F329 Major depressive disorder, single episode, unspecified: Secondary | ICD-10-CM | POA: Diagnosis not present

## 2017-12-29 DIAGNOSIS — E119 Type 2 diabetes mellitus without complications: Secondary | ICD-10-CM | POA: Diagnosis not present

## 2018-01-03 DIAGNOSIS — M5137 Other intervertebral disc degeneration, lumbosacral region: Secondary | ICD-10-CM | POA: Diagnosis not present

## 2018-01-03 DIAGNOSIS — Z09 Encounter for follow-up examination after completed treatment for conditions other than malignant neoplasm: Secondary | ICD-10-CM | POA: Diagnosis not present

## 2018-01-03 DIAGNOSIS — Z981 Arthrodesis status: Secondary | ICD-10-CM | POA: Diagnosis not present

## 2018-01-05 DIAGNOSIS — M549 Dorsalgia, unspecified: Secondary | ICD-10-CM | POA: Diagnosis not present

## 2018-01-05 DIAGNOSIS — E119 Type 2 diabetes mellitus without complications: Secondary | ICD-10-CM | POA: Diagnosis not present

## 2018-01-05 DIAGNOSIS — R195 Other fecal abnormalities: Secondary | ICD-10-CM | POA: Diagnosis not present

## 2018-01-05 DIAGNOSIS — Z4789 Encounter for other orthopedic aftercare: Secondary | ICD-10-CM | POA: Diagnosis not present

## 2018-01-05 DIAGNOSIS — I10 Essential (primary) hypertension: Secondary | ICD-10-CM | POA: Diagnosis not present

## 2018-01-08 DIAGNOSIS — I10 Essential (primary) hypertension: Secondary | ICD-10-CM | POA: Diagnosis not present

## 2018-01-08 DIAGNOSIS — N400 Enlarged prostate without lower urinary tract symptoms: Secondary | ICD-10-CM | POA: Diagnosis not present

## 2018-01-08 DIAGNOSIS — M48062 Spinal stenosis, lumbar region with neurogenic claudication: Secondary | ICD-10-CM | POA: Diagnosis not present

## 2018-01-08 DIAGNOSIS — F329 Major depressive disorder, single episode, unspecified: Secondary | ICD-10-CM | POA: Diagnosis not present

## 2018-01-08 DIAGNOSIS — D649 Anemia, unspecified: Secondary | ICD-10-CM | POA: Diagnosis not present

## 2018-01-08 DIAGNOSIS — E119 Type 2 diabetes mellitus without complications: Secondary | ICD-10-CM | POA: Diagnosis not present

## 2018-01-08 DIAGNOSIS — M5416 Radiculopathy, lumbar region: Secondary | ICD-10-CM | POA: Diagnosis not present

## 2018-01-08 DIAGNOSIS — F419 Anxiety disorder, unspecified: Secondary | ICD-10-CM | POA: Diagnosis not present

## 2018-01-08 DIAGNOSIS — Z4789 Encounter for other orthopedic aftercare: Secondary | ICD-10-CM | POA: Diagnosis not present

## 2018-01-10 DIAGNOSIS — M48062 Spinal stenosis, lumbar region with neurogenic claudication: Secondary | ICD-10-CM | POA: Diagnosis not present

## 2018-01-11 DIAGNOSIS — N400 Enlarged prostate without lower urinary tract symptoms: Secondary | ICD-10-CM | POA: Diagnosis not present

## 2018-01-11 DIAGNOSIS — D649 Anemia, unspecified: Secondary | ICD-10-CM | POA: Diagnosis not present

## 2018-01-11 DIAGNOSIS — Z4789 Encounter for other orthopedic aftercare: Secondary | ICD-10-CM | POA: Diagnosis not present

## 2018-01-11 DIAGNOSIS — M5416 Radiculopathy, lumbar region: Secondary | ICD-10-CM | POA: Diagnosis not present

## 2018-01-11 DIAGNOSIS — I10 Essential (primary) hypertension: Secondary | ICD-10-CM | POA: Diagnosis not present

## 2018-01-11 DIAGNOSIS — F329 Major depressive disorder, single episode, unspecified: Secondary | ICD-10-CM | POA: Diagnosis not present

## 2018-01-11 DIAGNOSIS — M48062 Spinal stenosis, lumbar region with neurogenic claudication: Secondary | ICD-10-CM | POA: Diagnosis not present

## 2018-01-11 DIAGNOSIS — F419 Anxiety disorder, unspecified: Secondary | ICD-10-CM | POA: Diagnosis not present

## 2018-01-11 DIAGNOSIS — E119 Type 2 diabetes mellitus without complications: Secondary | ICD-10-CM | POA: Diagnosis not present

## 2018-01-12 ENCOUNTER — Other Ambulatory Visit: Payer: Self-pay

## 2018-01-12 NOTE — Patient Outreach (Signed)
[facility]) Care Management  01/12/2018  Brad Santiago 06/04/1941 [id number]  Transition of care  Referral date: 01/10/18 Referral source: Discharged from [facility] and rehab on 01/07/18 Insurance: [employer]  Telephone call to patient regarding transition of care referral. HIPAA verified. Explained reason for call. Patient states he was in the hospital due to spinal stenosis. States he had back surgery. Patient states he has had 2 follow up appointments with the surgeon since discharge from [facility]. Patient states he has not talked with his primary MD office. RNCM explained importance of following up with primary MD and requested patient call office to schedule appointment. Patient verbalized understanding.   Patient  States his incision is healing well. States he had the stitches taken out this week. Reports pain level at a 5 today. Patient states he has a prescription for pain medication but did not fill.  States he does not like to take a lot of medication. Patient states he is going to just take tylenol if he can.  Patient states the surgeon ordered him to have a walker with rolling wheel and seat. Patient states he is not suppose to bend or lift.  Patient states he is talking with his insurance company about coverage for walker. Advised patient to call his insurance company back if he does not hear from them.   Patient states he lost 15 lbs since his surgery and being in the nursing home. Patient states his appetite is still not the best. Patient reports his family assists him with getting food. Patient states he is diabetic.  States his diabetes has been under control with the medication he is taking.  Patient reports his blood sugar has come down in the 70's. Patient states he is aware of hypoglycemia symptoms and knows how to address them.  Patient states he is receiving home health through [facility]. States physical therapy has started services and an home health  nurse is suppose to see him today.  RNCM offered ongoing transition of care follow up to patient. Patient refused services. States, " I should be alright with home health nurse and therapy."  RNCM advised patient to notify MD of any changes in condition prior to scheduled appointment. RNCM provided contact name and number: [phone] or main office number [phone] and 24 hour nurse advise line [phone] sent by mail as requested by patient.   RNCM verified patient aware of 911 services for urgent/ emergent needs.   PLAN: RNCM will close due to refusal of services.  RNCM will send primary MD closure notification RNCM will send [facility] brochure/ magnet to patient as discussed.   George Ina RN,BSN,CCM [facility] Telephonic  [phone]

## 2018-01-15 DIAGNOSIS — D649 Anemia, unspecified: Secondary | ICD-10-CM | POA: Diagnosis not present

## 2018-01-15 DIAGNOSIS — F329 Major depressive disorder, single episode, unspecified: Secondary | ICD-10-CM | POA: Diagnosis not present

## 2018-01-15 DIAGNOSIS — I10 Essential (primary) hypertension: Secondary | ICD-10-CM | POA: Diagnosis not present

## 2018-01-15 DIAGNOSIS — N400 Enlarged prostate without lower urinary tract symptoms: Secondary | ICD-10-CM | POA: Diagnosis not present

## 2018-01-15 DIAGNOSIS — Z4789 Encounter for other orthopedic aftercare: Secondary | ICD-10-CM | POA: Diagnosis not present

## 2018-01-15 DIAGNOSIS — M5416 Radiculopathy, lumbar region: Secondary | ICD-10-CM | POA: Diagnosis not present

## 2018-01-15 DIAGNOSIS — M48062 Spinal stenosis, lumbar region with neurogenic claudication: Secondary | ICD-10-CM | POA: Diagnosis not present

## 2018-01-15 DIAGNOSIS — E119 Type 2 diabetes mellitus without complications: Secondary | ICD-10-CM | POA: Diagnosis not present

## 2018-01-15 DIAGNOSIS — F419 Anxiety disorder, unspecified: Secondary | ICD-10-CM | POA: Diagnosis not present

## 2018-01-16 DIAGNOSIS — E119 Type 2 diabetes mellitus without complications: Secondary | ICD-10-CM | POA: Diagnosis not present

## 2018-01-16 DIAGNOSIS — D649 Anemia, unspecified: Secondary | ICD-10-CM | POA: Diagnosis not present

## 2018-01-16 DIAGNOSIS — M48062 Spinal stenosis, lumbar region with neurogenic claudication: Secondary | ICD-10-CM | POA: Diagnosis not present

## 2018-01-16 DIAGNOSIS — N400 Enlarged prostate without lower urinary tract symptoms: Secondary | ICD-10-CM | POA: Diagnosis not present

## 2018-01-16 DIAGNOSIS — F329 Major depressive disorder, single episode, unspecified: Secondary | ICD-10-CM | POA: Diagnosis not present

## 2018-01-16 DIAGNOSIS — Z4789 Encounter for other orthopedic aftercare: Secondary | ICD-10-CM | POA: Diagnosis not present

## 2018-01-16 DIAGNOSIS — F419 Anxiety disorder, unspecified: Secondary | ICD-10-CM | POA: Diagnosis not present

## 2018-01-16 DIAGNOSIS — I10 Essential (primary) hypertension: Secondary | ICD-10-CM | POA: Diagnosis not present

## 2018-01-16 DIAGNOSIS — M5416 Radiculopathy, lumbar region: Secondary | ICD-10-CM | POA: Diagnosis not present

## 2018-01-17 DIAGNOSIS — Z4789 Encounter for other orthopedic aftercare: Secondary | ICD-10-CM | POA: Diagnosis not present

## 2018-01-17 DIAGNOSIS — D649 Anemia, unspecified: Secondary | ICD-10-CM | POA: Diagnosis not present

## 2018-01-17 DIAGNOSIS — M5416 Radiculopathy, lumbar region: Secondary | ICD-10-CM | POA: Diagnosis not present

## 2018-01-17 DIAGNOSIS — F419 Anxiety disorder, unspecified: Secondary | ICD-10-CM | POA: Diagnosis not present

## 2018-01-17 DIAGNOSIS — F329 Major depressive disorder, single episode, unspecified: Secondary | ICD-10-CM | POA: Diagnosis not present

## 2018-01-17 DIAGNOSIS — N400 Enlarged prostate without lower urinary tract symptoms: Secondary | ICD-10-CM | POA: Diagnosis not present

## 2018-01-17 DIAGNOSIS — I10 Essential (primary) hypertension: Secondary | ICD-10-CM | POA: Diagnosis not present

## 2018-01-17 DIAGNOSIS — E119 Type 2 diabetes mellitus without complications: Secondary | ICD-10-CM | POA: Diagnosis not present

## 2018-01-17 DIAGNOSIS — M48062 Spinal stenosis, lumbar region with neurogenic claudication: Secondary | ICD-10-CM | POA: Diagnosis not present

## 2018-01-18 DIAGNOSIS — N400 Enlarged prostate without lower urinary tract symptoms: Secondary | ICD-10-CM | POA: Diagnosis not present

## 2018-01-18 DIAGNOSIS — I10 Essential (primary) hypertension: Secondary | ICD-10-CM | POA: Diagnosis not present

## 2018-01-18 DIAGNOSIS — E119 Type 2 diabetes mellitus without complications: Secondary | ICD-10-CM | POA: Diagnosis not present

## 2018-01-18 DIAGNOSIS — F419 Anxiety disorder, unspecified: Secondary | ICD-10-CM | POA: Diagnosis not present

## 2018-01-18 DIAGNOSIS — M48062 Spinal stenosis, lumbar region with neurogenic claudication: Secondary | ICD-10-CM | POA: Diagnosis not present

## 2018-01-18 DIAGNOSIS — M5416 Radiculopathy, lumbar region: Secondary | ICD-10-CM | POA: Diagnosis not present

## 2018-01-18 DIAGNOSIS — Z4789 Encounter for other orthopedic aftercare: Secondary | ICD-10-CM | POA: Diagnosis not present

## 2018-01-18 DIAGNOSIS — F329 Major depressive disorder, single episode, unspecified: Secondary | ICD-10-CM | POA: Diagnosis not present

## 2018-01-18 DIAGNOSIS — D649 Anemia, unspecified: Secondary | ICD-10-CM | POA: Diagnosis not present

## 2018-01-19 DIAGNOSIS — N400 Enlarged prostate without lower urinary tract symptoms: Secondary | ICD-10-CM | POA: Diagnosis not present

## 2018-01-19 DIAGNOSIS — M5416 Radiculopathy, lumbar region: Secondary | ICD-10-CM | POA: Diagnosis not present

## 2018-01-19 DIAGNOSIS — E119 Type 2 diabetes mellitus without complications: Secondary | ICD-10-CM | POA: Diagnosis not present

## 2018-01-19 DIAGNOSIS — F329 Major depressive disorder, single episode, unspecified: Secondary | ICD-10-CM | POA: Diagnosis not present

## 2018-01-19 DIAGNOSIS — D649 Anemia, unspecified: Secondary | ICD-10-CM | POA: Diagnosis not present

## 2018-01-19 DIAGNOSIS — Z4789 Encounter for other orthopedic aftercare: Secondary | ICD-10-CM | POA: Diagnosis not present

## 2018-01-19 DIAGNOSIS — F419 Anxiety disorder, unspecified: Secondary | ICD-10-CM | POA: Diagnosis not present

## 2018-01-19 DIAGNOSIS — M48062 Spinal stenosis, lumbar region with neurogenic claudication: Secondary | ICD-10-CM | POA: Diagnosis not present

## 2018-01-19 DIAGNOSIS — I10 Essential (primary) hypertension: Secondary | ICD-10-CM | POA: Diagnosis not present

## 2018-01-22 DIAGNOSIS — N400 Enlarged prostate without lower urinary tract symptoms: Secondary | ICD-10-CM | POA: Diagnosis not present

## 2018-01-22 DIAGNOSIS — I10 Essential (primary) hypertension: Secondary | ICD-10-CM | POA: Diagnosis not present

## 2018-01-22 DIAGNOSIS — M48062 Spinal stenosis, lumbar region with neurogenic claudication: Secondary | ICD-10-CM | POA: Diagnosis not present

## 2018-01-22 DIAGNOSIS — F329 Major depressive disorder, single episode, unspecified: Secondary | ICD-10-CM | POA: Diagnosis not present

## 2018-01-22 DIAGNOSIS — F419 Anxiety disorder, unspecified: Secondary | ICD-10-CM | POA: Diagnosis not present

## 2018-01-22 DIAGNOSIS — E119 Type 2 diabetes mellitus without complications: Secondary | ICD-10-CM | POA: Diagnosis not present

## 2018-01-22 DIAGNOSIS — M5416 Radiculopathy, lumbar region: Secondary | ICD-10-CM | POA: Diagnosis not present

## 2018-01-22 DIAGNOSIS — Z4789 Encounter for other orthopedic aftercare: Secondary | ICD-10-CM | POA: Diagnosis not present

## 2018-01-22 DIAGNOSIS — D649 Anemia, unspecified: Secondary | ICD-10-CM | POA: Diagnosis not present

## 2018-01-23 DIAGNOSIS — F419 Anxiety disorder, unspecified: Secondary | ICD-10-CM | POA: Diagnosis not present

## 2018-01-23 DIAGNOSIS — N400 Enlarged prostate without lower urinary tract symptoms: Secondary | ICD-10-CM | POA: Diagnosis not present

## 2018-01-23 DIAGNOSIS — D649 Anemia, unspecified: Secondary | ICD-10-CM | POA: Diagnosis not present

## 2018-01-23 DIAGNOSIS — M5416 Radiculopathy, lumbar region: Secondary | ICD-10-CM | POA: Diagnosis not present

## 2018-01-23 DIAGNOSIS — E119 Type 2 diabetes mellitus without complications: Secondary | ICD-10-CM | POA: Diagnosis not present

## 2018-01-23 DIAGNOSIS — M48062 Spinal stenosis, lumbar region with neurogenic claudication: Secondary | ICD-10-CM | POA: Diagnosis not present

## 2018-01-23 DIAGNOSIS — Z4789 Encounter for other orthopedic aftercare: Secondary | ICD-10-CM | POA: Diagnosis not present

## 2018-01-23 DIAGNOSIS — F329 Major depressive disorder, single episode, unspecified: Secondary | ICD-10-CM | POA: Diagnosis not present

## 2018-01-23 DIAGNOSIS — I10 Essential (primary) hypertension: Secondary | ICD-10-CM | POA: Diagnosis not present

## 2018-01-24 DIAGNOSIS — Z4789 Encounter for other orthopedic aftercare: Secondary | ICD-10-CM | POA: Diagnosis not present

## 2018-01-24 DIAGNOSIS — D649 Anemia, unspecified: Secondary | ICD-10-CM | POA: Diagnosis not present

## 2018-01-24 DIAGNOSIS — I10 Essential (primary) hypertension: Secondary | ICD-10-CM | POA: Diagnosis not present

## 2018-01-24 DIAGNOSIS — M5416 Radiculopathy, lumbar region: Secondary | ICD-10-CM | POA: Diagnosis not present

## 2018-01-24 DIAGNOSIS — F419 Anxiety disorder, unspecified: Secondary | ICD-10-CM | POA: Diagnosis not present

## 2018-01-24 DIAGNOSIS — N400 Enlarged prostate without lower urinary tract symptoms: Secondary | ICD-10-CM | POA: Diagnosis not present

## 2018-01-24 DIAGNOSIS — E119 Type 2 diabetes mellitus without complications: Secondary | ICD-10-CM | POA: Diagnosis not present

## 2018-01-24 DIAGNOSIS — M48062 Spinal stenosis, lumbar region with neurogenic claudication: Secondary | ICD-10-CM | POA: Diagnosis not present

## 2018-01-24 DIAGNOSIS — F329 Major depressive disorder, single episode, unspecified: Secondary | ICD-10-CM | POA: Diagnosis not present

## 2018-01-25 DIAGNOSIS — N400 Enlarged prostate without lower urinary tract symptoms: Secondary | ICD-10-CM | POA: Diagnosis not present

## 2018-01-25 DIAGNOSIS — I10 Essential (primary) hypertension: Secondary | ICD-10-CM | POA: Diagnosis not present

## 2018-01-25 DIAGNOSIS — E119 Type 2 diabetes mellitus without complications: Secondary | ICD-10-CM | POA: Diagnosis not present

## 2018-01-25 DIAGNOSIS — M5416 Radiculopathy, lumbar region: Secondary | ICD-10-CM | POA: Diagnosis not present

## 2018-01-25 DIAGNOSIS — Z4789 Encounter for other orthopedic aftercare: Secondary | ICD-10-CM | POA: Diagnosis not present

## 2018-01-25 DIAGNOSIS — D649 Anemia, unspecified: Secondary | ICD-10-CM | POA: Diagnosis not present

## 2018-01-25 DIAGNOSIS — F329 Major depressive disorder, single episode, unspecified: Secondary | ICD-10-CM | POA: Diagnosis not present

## 2018-01-25 DIAGNOSIS — M48062 Spinal stenosis, lumbar region with neurogenic claudication: Secondary | ICD-10-CM | POA: Diagnosis not present

## 2018-01-25 DIAGNOSIS — F419 Anxiety disorder, unspecified: Secondary | ICD-10-CM | POA: Diagnosis not present

## 2018-01-26 DIAGNOSIS — I10 Essential (primary) hypertension: Secondary | ICD-10-CM | POA: Diagnosis not present

## 2018-01-26 DIAGNOSIS — N400 Enlarged prostate without lower urinary tract symptoms: Secondary | ICD-10-CM | POA: Diagnosis not present

## 2018-01-26 DIAGNOSIS — D649 Anemia, unspecified: Secondary | ICD-10-CM | POA: Diagnosis not present

## 2018-01-26 DIAGNOSIS — M48062 Spinal stenosis, lumbar region with neurogenic claudication: Secondary | ICD-10-CM | POA: Diagnosis not present

## 2018-01-26 DIAGNOSIS — E119 Type 2 diabetes mellitus without complications: Secondary | ICD-10-CM | POA: Diagnosis not present

## 2018-01-26 DIAGNOSIS — F419 Anxiety disorder, unspecified: Secondary | ICD-10-CM | POA: Diagnosis not present

## 2018-01-26 DIAGNOSIS — M5416 Radiculopathy, lumbar region: Secondary | ICD-10-CM | POA: Diagnosis not present

## 2018-01-26 DIAGNOSIS — F329 Major depressive disorder, single episode, unspecified: Secondary | ICD-10-CM | POA: Diagnosis not present

## 2018-01-26 DIAGNOSIS — Z4789 Encounter for other orthopedic aftercare: Secondary | ICD-10-CM | POA: Diagnosis not present

## 2018-01-29 DIAGNOSIS — F329 Major depressive disorder, single episode, unspecified: Secondary | ICD-10-CM | POA: Diagnosis not present

## 2018-01-29 DIAGNOSIS — E119 Type 2 diabetes mellitus without complications: Secondary | ICD-10-CM | POA: Diagnosis not present

## 2018-01-29 DIAGNOSIS — M5416 Radiculopathy, lumbar region: Secondary | ICD-10-CM | POA: Diagnosis not present

## 2018-01-29 DIAGNOSIS — M48062 Spinal stenosis, lumbar region with neurogenic claudication: Secondary | ICD-10-CM | POA: Diagnosis not present

## 2018-01-29 DIAGNOSIS — I10 Essential (primary) hypertension: Secondary | ICD-10-CM | POA: Diagnosis not present

## 2018-01-29 DIAGNOSIS — Z4789 Encounter for other orthopedic aftercare: Secondary | ICD-10-CM | POA: Diagnosis not present

## 2018-01-29 DIAGNOSIS — D649 Anemia, unspecified: Secondary | ICD-10-CM | POA: Diagnosis not present

## 2018-01-29 DIAGNOSIS — N400 Enlarged prostate without lower urinary tract symptoms: Secondary | ICD-10-CM | POA: Diagnosis not present

## 2018-01-29 DIAGNOSIS — F419 Anxiety disorder, unspecified: Secondary | ICD-10-CM | POA: Diagnosis not present

## 2018-01-31 DIAGNOSIS — F329 Major depressive disorder, single episode, unspecified: Secondary | ICD-10-CM | POA: Diagnosis not present

## 2018-01-31 DIAGNOSIS — M5416 Radiculopathy, lumbar region: Secondary | ICD-10-CM | POA: Diagnosis not present

## 2018-01-31 DIAGNOSIS — Z4789 Encounter for other orthopedic aftercare: Secondary | ICD-10-CM | POA: Diagnosis not present

## 2018-01-31 DIAGNOSIS — F419 Anxiety disorder, unspecified: Secondary | ICD-10-CM | POA: Diagnosis not present

## 2018-01-31 DIAGNOSIS — N400 Enlarged prostate without lower urinary tract symptoms: Secondary | ICD-10-CM | POA: Diagnosis not present

## 2018-01-31 DIAGNOSIS — I10 Essential (primary) hypertension: Secondary | ICD-10-CM | POA: Diagnosis not present

## 2018-01-31 DIAGNOSIS — E119 Type 2 diabetes mellitus without complications: Secondary | ICD-10-CM | POA: Diagnosis not present

## 2018-01-31 DIAGNOSIS — D649 Anemia, unspecified: Secondary | ICD-10-CM | POA: Diagnosis not present

## 2018-01-31 DIAGNOSIS — M48062 Spinal stenosis, lumbar region with neurogenic claudication: Secondary | ICD-10-CM | POA: Diagnosis not present

## 2018-02-02 DIAGNOSIS — M5416 Radiculopathy, lumbar region: Secondary | ICD-10-CM | POA: Diagnosis not present

## 2018-02-02 DIAGNOSIS — N400 Enlarged prostate without lower urinary tract symptoms: Secondary | ICD-10-CM | POA: Diagnosis not present

## 2018-02-02 DIAGNOSIS — I10 Essential (primary) hypertension: Secondary | ICD-10-CM | POA: Diagnosis not present

## 2018-02-02 DIAGNOSIS — F419 Anxiety disorder, unspecified: Secondary | ICD-10-CM | POA: Diagnosis not present

## 2018-02-02 DIAGNOSIS — E119 Type 2 diabetes mellitus without complications: Secondary | ICD-10-CM | POA: Diagnosis not present

## 2018-02-02 DIAGNOSIS — F329 Major depressive disorder, single episode, unspecified: Secondary | ICD-10-CM | POA: Diagnosis not present

## 2018-02-02 DIAGNOSIS — M48062 Spinal stenosis, lumbar region with neurogenic claudication: Secondary | ICD-10-CM | POA: Diagnosis not present

## 2018-02-02 DIAGNOSIS — Z4789 Encounter for other orthopedic aftercare: Secondary | ICD-10-CM | POA: Diagnosis not present

## 2018-02-02 DIAGNOSIS — D649 Anemia, unspecified: Secondary | ICD-10-CM | POA: Diagnosis not present

## 2018-02-05 DIAGNOSIS — Z4789 Encounter for other orthopedic aftercare: Secondary | ICD-10-CM | POA: Diagnosis not present

## 2018-02-05 DIAGNOSIS — F419 Anxiety disorder, unspecified: Secondary | ICD-10-CM | POA: Diagnosis not present

## 2018-02-05 DIAGNOSIS — M48062 Spinal stenosis, lumbar region with neurogenic claudication: Secondary | ICD-10-CM | POA: Diagnosis not present

## 2018-02-05 DIAGNOSIS — D649 Anemia, unspecified: Secondary | ICD-10-CM | POA: Diagnosis not present

## 2018-02-05 DIAGNOSIS — I10 Essential (primary) hypertension: Secondary | ICD-10-CM | POA: Diagnosis not present

## 2018-02-05 DIAGNOSIS — E119 Type 2 diabetes mellitus without complications: Secondary | ICD-10-CM | POA: Diagnosis not present

## 2018-02-05 DIAGNOSIS — F329 Major depressive disorder, single episode, unspecified: Secondary | ICD-10-CM | POA: Diagnosis not present

## 2018-02-05 DIAGNOSIS — N400 Enlarged prostate without lower urinary tract symptoms: Secondary | ICD-10-CM | POA: Diagnosis not present

## 2018-02-05 DIAGNOSIS — M5416 Radiculopathy, lumbar region: Secondary | ICD-10-CM | POA: Diagnosis not present

## 2018-02-07 DIAGNOSIS — Z4789 Encounter for other orthopedic aftercare: Secondary | ICD-10-CM | POA: Diagnosis not present

## 2018-02-07 DIAGNOSIS — M5416 Radiculopathy, lumbar region: Secondary | ICD-10-CM | POA: Diagnosis not present

## 2018-02-07 DIAGNOSIS — F329 Major depressive disorder, single episode, unspecified: Secondary | ICD-10-CM | POA: Diagnosis not present

## 2018-02-07 DIAGNOSIS — I10 Essential (primary) hypertension: Secondary | ICD-10-CM | POA: Diagnosis not present

## 2018-02-07 DIAGNOSIS — D649 Anemia, unspecified: Secondary | ICD-10-CM | POA: Diagnosis not present

## 2018-02-07 DIAGNOSIS — M48062 Spinal stenosis, lumbar region with neurogenic claudication: Secondary | ICD-10-CM | POA: Diagnosis not present

## 2018-02-07 DIAGNOSIS — F419 Anxiety disorder, unspecified: Secondary | ICD-10-CM | POA: Diagnosis not present

## 2018-02-07 DIAGNOSIS — E119 Type 2 diabetes mellitus without complications: Secondary | ICD-10-CM | POA: Diagnosis not present

## 2018-02-07 DIAGNOSIS — N400 Enlarged prostate without lower urinary tract symptoms: Secondary | ICD-10-CM | POA: Diagnosis not present

## 2018-02-14 DIAGNOSIS — Z981 Arthrodesis status: Secondary | ICD-10-CM | POA: Diagnosis not present

## 2018-02-14 DIAGNOSIS — M48062 Spinal stenosis, lumbar region with neurogenic claudication: Secondary | ICD-10-CM | POA: Diagnosis not present

## 2018-02-14 DIAGNOSIS — Z882 Allergy status to sulfonamides status: Secondary | ICD-10-CM | POA: Diagnosis not present

## 2018-02-14 DIAGNOSIS — M48061 Spinal stenosis, lumbar region without neurogenic claudication: Secondary | ICD-10-CM | POA: Diagnosis not present

## 2018-02-14 DIAGNOSIS — M5416 Radiculopathy, lumbar region: Secondary | ICD-10-CM | POA: Diagnosis not present

## 2018-02-14 DIAGNOSIS — M4316 Spondylolisthesis, lumbar region: Secondary | ICD-10-CM | POA: Diagnosis not present

## 2018-02-26 DIAGNOSIS — M545 Low back pain: Secondary | ICD-10-CM | POA: Diagnosis not present

## 2018-03-01 DIAGNOSIS — M545 Low back pain: Secondary | ICD-10-CM | POA: Diagnosis not present

## 2018-03-06 DIAGNOSIS — M545 Low back pain: Secondary | ICD-10-CM | POA: Diagnosis not present

## 2018-03-13 DIAGNOSIS — M545 Low back pain: Secondary | ICD-10-CM | POA: Diagnosis not present

## 2018-03-15 DIAGNOSIS — M545 Low back pain: Secondary | ICD-10-CM | POA: Diagnosis not present

## 2018-03-20 DIAGNOSIS — M545 Low back pain: Secondary | ICD-10-CM | POA: Diagnosis not present

## 2018-03-23 DIAGNOSIS — M545 Low back pain: Secondary | ICD-10-CM | POA: Diagnosis not present

## 2018-03-27 DIAGNOSIS — M545 Low back pain: Secondary | ICD-10-CM | POA: Diagnosis not present

## 2018-03-29 DIAGNOSIS — M545 Low back pain: Secondary | ICD-10-CM | POA: Diagnosis not present

## 2018-04-02 DIAGNOSIS — Z79899 Other long term (current) drug therapy: Secondary | ICD-10-CM | POA: Diagnosis not present

## 2018-04-02 DIAGNOSIS — M48062 Spinal stenosis, lumbar region with neurogenic claudication: Secondary | ICD-10-CM | POA: Diagnosis not present

## 2018-04-02 DIAGNOSIS — Z981 Arthrodesis status: Secondary | ICD-10-CM | POA: Diagnosis not present

## 2018-04-02 DIAGNOSIS — M5416 Radiculopathy, lumbar region: Secondary | ICD-10-CM | POA: Diagnosis not present

## 2018-04-02 DIAGNOSIS — M4316 Spondylolisthesis, lumbar region: Secondary | ICD-10-CM | POA: Diagnosis not present

## 2018-04-02 DIAGNOSIS — M48061 Spinal stenosis, lumbar region without neurogenic claudication: Secondary | ICD-10-CM | POA: Diagnosis not present

## 2018-04-11 DIAGNOSIS — M545 Low back pain: Secondary | ICD-10-CM | POA: Diagnosis not present

## 2018-04-18 DIAGNOSIS — M545 Low back pain: Secondary | ICD-10-CM | POA: Diagnosis not present

## 2018-04-20 DIAGNOSIS — M545 Low back pain: Secondary | ICD-10-CM | POA: Diagnosis not present

## 2018-04-23 DIAGNOSIS — M545 Low back pain: Secondary | ICD-10-CM | POA: Diagnosis not present

## 2018-04-26 DIAGNOSIS — M545 Low back pain: Secondary | ICD-10-CM | POA: Diagnosis not present

## 2018-04-30 DIAGNOSIS — M545 Low back pain: Secondary | ICD-10-CM | POA: Diagnosis not present

## 2018-05-03 DIAGNOSIS — M545 Low back pain: Secondary | ICD-10-CM | POA: Diagnosis not present

## 2018-05-10 DIAGNOSIS — M545 Low back pain: Secondary | ICD-10-CM | POA: Diagnosis not present

## 2018-06-11 DIAGNOSIS — E785 Hyperlipidemia, unspecified: Secondary | ICD-10-CM | POA: Diagnosis not present

## 2018-06-11 DIAGNOSIS — E119 Type 2 diabetes mellitus without complications: Secondary | ICD-10-CM | POA: Diagnosis not present

## 2018-06-11 DIAGNOSIS — F419 Anxiety disorder, unspecified: Secondary | ICD-10-CM | POA: Diagnosis not present

## 2018-06-11 DIAGNOSIS — I10 Essential (primary) hypertension: Secondary | ICD-10-CM | POA: Diagnosis not present

## 2018-06-11 DIAGNOSIS — E039 Hypothyroidism, unspecified: Secondary | ICD-10-CM | POA: Diagnosis not present

## 2018-06-12 DIAGNOSIS — Z23 Encounter for immunization: Secondary | ICD-10-CM | POA: Diagnosis not present

## 2018-06-25 DIAGNOSIS — E875 Hyperkalemia: Secondary | ICD-10-CM | POA: Diagnosis not present

## 2018-07-04 DIAGNOSIS — Z981 Arthrodesis status: Secondary | ICD-10-CM | POA: Diagnosis not present

## 2018-07-04 DIAGNOSIS — Z79899 Other long term (current) drug therapy: Secondary | ICD-10-CM | POA: Diagnosis not present

## 2018-07-04 DIAGNOSIS — M545 Low back pain: Secondary | ICD-10-CM | POA: Diagnosis not present

## 2018-07-04 DIAGNOSIS — Z882 Allergy status to sulfonamides status: Secondary | ICD-10-CM | POA: Diagnosis not present

## 2018-08-16 DIAGNOSIS — Z981 Arthrodesis status: Secondary | ICD-10-CM | POA: Diagnosis not present

## 2018-08-16 DIAGNOSIS — Z882 Allergy status to sulfonamides status: Secondary | ICD-10-CM | POA: Diagnosis not present

## 2018-08-16 DIAGNOSIS — Z09 Encounter for follow-up examination after completed treatment for conditions other than malignant neoplasm: Secondary | ICD-10-CM | POA: Diagnosis not present

## 2018-08-16 DIAGNOSIS — M549 Dorsalgia, unspecified: Secondary | ICD-10-CM | POA: Diagnosis not present

## 2018-10-15 DIAGNOSIS — M5416 Radiculopathy, lumbar region: Secondary | ICD-10-CM | POA: Diagnosis not present

## 2018-10-15 DIAGNOSIS — M48062 Spinal stenosis, lumbar region with neurogenic claudication: Secondary | ICD-10-CM | POA: Diagnosis not present

## 2018-10-15 DIAGNOSIS — M549 Dorsalgia, unspecified: Secondary | ICD-10-CM | POA: Diagnosis not present

## 2018-10-15 DIAGNOSIS — Z981 Arthrodesis status: Secondary | ICD-10-CM | POA: Diagnosis not present

## 2019-01-09 DIAGNOSIS — M5416 Radiculopathy, lumbar region: Secondary | ICD-10-CM | POA: Diagnosis not present

## 2019-01-09 DIAGNOSIS — Z981 Arthrodesis status: Secondary | ICD-10-CM | POA: Diagnosis not present

## 2019-01-09 DIAGNOSIS — M48062 Spinal stenosis, lumbar region with neurogenic claudication: Secondary | ICD-10-CM | POA: Diagnosis not present

## 2019-01-16 DIAGNOSIS — Z981 Arthrodesis status: Secondary | ICD-10-CM | POA: Diagnosis not present

## 2019-01-16 DIAGNOSIS — M5442 Lumbago with sciatica, left side: Secondary | ICD-10-CM | POA: Diagnosis not present

## 2019-01-16 DIAGNOSIS — M5416 Radiculopathy, lumbar region: Secondary | ICD-10-CM | POA: Diagnosis not present

## 2019-01-16 DIAGNOSIS — G8929 Other chronic pain: Secondary | ICD-10-CM | POA: Diagnosis not present

## 2019-01-16 DIAGNOSIS — M5441 Lumbago with sciatica, right side: Secondary | ICD-10-CM | POA: Diagnosis not present

## 2019-01-22 DIAGNOSIS — M5416 Radiculopathy, lumbar region: Secondary | ICD-10-CM | POA: Diagnosis not present

## 2019-02-01 DIAGNOSIS — M5416 Radiculopathy, lumbar region: Secondary | ICD-10-CM | POA: Diagnosis not present

## 2019-02-08 DIAGNOSIS — Z0000 Encounter for general adult medical examination without abnormal findings: Secondary | ICD-10-CM | POA: Diagnosis not present

## 2019-02-08 DIAGNOSIS — F325 Major depressive disorder, single episode, in full remission: Secondary | ICD-10-CM | POA: Diagnosis not present

## 2019-02-08 DIAGNOSIS — E039 Hypothyroidism, unspecified: Secondary | ICD-10-CM | POA: Diagnosis not present

## 2019-02-08 DIAGNOSIS — M5442 Lumbago with sciatica, left side: Secondary | ICD-10-CM | POA: Diagnosis not present

## 2019-02-08 DIAGNOSIS — M5441 Lumbago with sciatica, right side: Secondary | ICD-10-CM | POA: Diagnosis not present

## 2019-02-08 DIAGNOSIS — I10 Essential (primary) hypertension: Secondary | ICD-10-CM | POA: Diagnosis not present

## 2019-02-08 DIAGNOSIS — E119 Type 2 diabetes mellitus without complications: Secondary | ICD-10-CM | POA: Diagnosis not present

## 2019-02-08 DIAGNOSIS — E785 Hyperlipidemia, unspecified: Secondary | ICD-10-CM | POA: Diagnosis not present

## 2019-02-14 DIAGNOSIS — M5441 Lumbago with sciatica, right side: Secondary | ICD-10-CM | POA: Diagnosis not present

## 2019-02-14 DIAGNOSIS — G8929 Other chronic pain: Secondary | ICD-10-CM | POA: Diagnosis not present

## 2019-02-14 DIAGNOSIS — Z981 Arthrodesis status: Secondary | ICD-10-CM | POA: Diagnosis not present

## 2019-02-14 DIAGNOSIS — M5442 Lumbago with sciatica, left side: Secondary | ICD-10-CM | POA: Diagnosis not present

## 2019-02-14 DIAGNOSIS — M5416 Radiculopathy, lumbar region: Secondary | ICD-10-CM | POA: Diagnosis not present

## 2019-02-19 DIAGNOSIS — M5416 Radiculopathy, lumbar region: Secondary | ICD-10-CM | POA: Diagnosis not present

## 2019-02-19 DIAGNOSIS — M5442 Lumbago with sciatica, left side: Secondary | ICD-10-CM | POA: Diagnosis not present

## 2019-02-19 DIAGNOSIS — G8929 Other chronic pain: Secondary | ICD-10-CM | POA: Diagnosis not present

## 2019-02-19 DIAGNOSIS — M5441 Lumbago with sciatica, right side: Secondary | ICD-10-CM | POA: Diagnosis not present

## 2019-02-19 DIAGNOSIS — Z981 Arthrodesis status: Secondary | ICD-10-CM | POA: Diagnosis not present

## 2019-02-20 ENCOUNTER — Other Ambulatory Visit: Payer: Self-pay | Admitting: Family Medicine

## 2019-02-20 DIAGNOSIS — N644 Mastodynia: Secondary | ICD-10-CM

## 2019-02-21 ENCOUNTER — Other Ambulatory Visit: Payer: Self-pay | Admitting: Family Medicine

## 2019-02-21 DIAGNOSIS — N644 Mastodynia: Secondary | ICD-10-CM

## 2019-02-22 DIAGNOSIS — Z981 Arthrodesis status: Secondary | ICD-10-CM | POA: Diagnosis not present

## 2019-02-22 DIAGNOSIS — M5416 Radiculopathy, lumbar region: Secondary | ICD-10-CM | POA: Diagnosis not present

## 2019-02-22 DIAGNOSIS — M5442 Lumbago with sciatica, left side: Secondary | ICD-10-CM | POA: Diagnosis not present

## 2019-02-22 DIAGNOSIS — G8929 Other chronic pain: Secondary | ICD-10-CM | POA: Diagnosis not present

## 2019-02-22 DIAGNOSIS — M5441 Lumbago with sciatica, right side: Secondary | ICD-10-CM | POA: Diagnosis not present

## 2019-02-26 DIAGNOSIS — M5442 Lumbago with sciatica, left side: Secondary | ICD-10-CM | POA: Diagnosis not present

## 2019-02-26 DIAGNOSIS — G8929 Other chronic pain: Secondary | ICD-10-CM | POA: Diagnosis not present

## 2019-02-26 DIAGNOSIS — M5441 Lumbago with sciatica, right side: Secondary | ICD-10-CM | POA: Diagnosis not present

## 2019-02-26 DIAGNOSIS — M5416 Radiculopathy, lumbar region: Secondary | ICD-10-CM | POA: Diagnosis not present

## 2019-02-26 DIAGNOSIS — Z981 Arthrodesis status: Secondary | ICD-10-CM | POA: Diagnosis not present

## 2019-02-28 ENCOUNTER — Ambulatory Visit
Admission: RE | Admit: 2019-02-28 | Discharge: 2019-02-28 | Disposition: A | Discharge status: Home / Self Care | Payer: Medicare HMO | Source: Ambulatory Visit | Attending: Family Medicine | Admitting: Family Medicine

## 2019-02-28 ENCOUNTER — Other Ambulatory Visit: Payer: Self-pay

## 2019-02-28 DIAGNOSIS — R928 Other abnormal and inconclusive findings on diagnostic imaging of breast: Secondary | ICD-10-CM | POA: Diagnosis not present

## 2019-02-28 DIAGNOSIS — N644 Mastodynia: Secondary | ICD-10-CM

## 2019-03-01 DIAGNOSIS — G8929 Other chronic pain: Secondary | ICD-10-CM | POA: Diagnosis not present

## 2019-03-01 DIAGNOSIS — M5416 Radiculopathy, lumbar region: Secondary | ICD-10-CM | POA: Diagnosis not present

## 2019-03-01 DIAGNOSIS — Z981 Arthrodesis status: Secondary | ICD-10-CM | POA: Diagnosis not present

## 2019-03-01 DIAGNOSIS — M5442 Lumbago with sciatica, left side: Secondary | ICD-10-CM | POA: Diagnosis not present

## 2019-03-01 DIAGNOSIS — M5441 Lumbago with sciatica, right side: Secondary | ICD-10-CM | POA: Diagnosis not present

## 2019-03-05 DIAGNOSIS — G8929 Other chronic pain: Secondary | ICD-10-CM | POA: Diagnosis not present

## 2019-03-05 DIAGNOSIS — M5441 Lumbago with sciatica, right side: Secondary | ICD-10-CM | POA: Diagnosis not present

## 2019-03-05 DIAGNOSIS — Z981 Arthrodesis status: Secondary | ICD-10-CM | POA: Diagnosis not present

## 2019-03-05 DIAGNOSIS — M5416 Radiculopathy, lumbar region: Secondary | ICD-10-CM | POA: Diagnosis not present

## 2019-03-05 DIAGNOSIS — M5442 Lumbago with sciatica, left side: Secondary | ICD-10-CM | POA: Diagnosis not present

## 2019-03-07 DIAGNOSIS — M5416 Radiculopathy, lumbar region: Secondary | ICD-10-CM | POA: Diagnosis not present

## 2019-03-07 DIAGNOSIS — M5441 Lumbago with sciatica, right side: Secondary | ICD-10-CM | POA: Diagnosis not present

## 2019-03-07 DIAGNOSIS — M5442 Lumbago with sciatica, left side: Secondary | ICD-10-CM | POA: Diagnosis not present

## 2019-03-07 DIAGNOSIS — G8929 Other chronic pain: Secondary | ICD-10-CM | POA: Diagnosis not present

## 2019-03-07 DIAGNOSIS — Z981 Arthrodesis status: Secondary | ICD-10-CM | POA: Diagnosis not present

## 2019-03-12 DIAGNOSIS — M5416 Radiculopathy, lumbar region: Secondary | ICD-10-CM | POA: Diagnosis not present

## 2019-03-12 DIAGNOSIS — M5441 Lumbago with sciatica, right side: Secondary | ICD-10-CM | POA: Diagnosis not present

## 2019-03-12 DIAGNOSIS — G8929 Other chronic pain: Secondary | ICD-10-CM | POA: Diagnosis not present

## 2019-03-12 DIAGNOSIS — Z981 Arthrodesis status: Secondary | ICD-10-CM | POA: Diagnosis not present

## 2019-03-12 DIAGNOSIS — M5442 Lumbago with sciatica, left side: Secondary | ICD-10-CM | POA: Diagnosis not present

## 2019-03-18 DIAGNOSIS — M545 Low back pain: Secondary | ICD-10-CM | POA: Diagnosis not present

## 2019-03-18 DIAGNOSIS — M5442 Lumbago with sciatica, left side: Secondary | ICD-10-CM | POA: Diagnosis not present

## 2019-03-18 DIAGNOSIS — G5793 Unspecified mononeuropathy of bilateral lower limbs: Secondary | ICD-10-CM | POA: Diagnosis not present

## 2019-03-18 DIAGNOSIS — M5441 Lumbago with sciatica, right side: Secondary | ICD-10-CM | POA: Diagnosis not present

## 2019-03-18 DIAGNOSIS — G8929 Other chronic pain: Secondary | ICD-10-CM | POA: Diagnosis not present

## 2019-03-18 DIAGNOSIS — Z981 Arthrodesis status: Secondary | ICD-10-CM | POA: Diagnosis not present

## 2019-03-18 DIAGNOSIS — M5416 Radiculopathy, lumbar region: Secondary | ICD-10-CM | POA: Diagnosis not present

## 2019-03-19 DIAGNOSIS — Z981 Arthrodesis status: Secondary | ICD-10-CM | POA: Diagnosis not present

## 2019-03-19 DIAGNOSIS — M5442 Lumbago with sciatica, left side: Secondary | ICD-10-CM | POA: Diagnosis not present

## 2019-03-19 DIAGNOSIS — M5416 Radiculopathy, lumbar region: Secondary | ICD-10-CM | POA: Diagnosis not present

## 2019-03-19 DIAGNOSIS — M5441 Lumbago with sciatica, right side: Secondary | ICD-10-CM | POA: Diagnosis not present

## 2019-03-19 DIAGNOSIS — G8929 Other chronic pain: Secondary | ICD-10-CM | POA: Diagnosis not present

## 2019-03-21 DIAGNOSIS — M5441 Lumbago with sciatica, right side: Secondary | ICD-10-CM | POA: Diagnosis not present

## 2019-03-21 DIAGNOSIS — M5416 Radiculopathy, lumbar region: Secondary | ICD-10-CM | POA: Diagnosis not present

## 2019-03-21 DIAGNOSIS — G8929 Other chronic pain: Secondary | ICD-10-CM | POA: Diagnosis not present

## 2019-03-21 DIAGNOSIS — M5442 Lumbago with sciatica, left side: Secondary | ICD-10-CM | POA: Diagnosis not present

## 2019-03-21 DIAGNOSIS — Z981 Arthrodesis status: Secondary | ICD-10-CM | POA: Diagnosis not present

## 2019-03-26 DIAGNOSIS — Z981 Arthrodesis status: Secondary | ICD-10-CM | POA: Diagnosis not present

## 2019-03-26 DIAGNOSIS — M5442 Lumbago with sciatica, left side: Secondary | ICD-10-CM | POA: Diagnosis not present

## 2019-03-26 DIAGNOSIS — M5416 Radiculopathy, lumbar region: Secondary | ICD-10-CM | POA: Diagnosis not present

## 2019-03-26 DIAGNOSIS — M5441 Lumbago with sciatica, right side: Secondary | ICD-10-CM | POA: Diagnosis not present

## 2019-03-26 DIAGNOSIS — G8929 Other chronic pain: Secondary | ICD-10-CM | POA: Diagnosis not present

## 2019-03-28 DIAGNOSIS — M5442 Lumbago with sciatica, left side: Secondary | ICD-10-CM | POA: Diagnosis not present

## 2019-03-28 DIAGNOSIS — M5416 Radiculopathy, lumbar region: Secondary | ICD-10-CM | POA: Diagnosis not present

## 2019-03-28 DIAGNOSIS — G8929 Other chronic pain: Secondary | ICD-10-CM | POA: Diagnosis not present

## 2019-03-28 DIAGNOSIS — Z981 Arthrodesis status: Secondary | ICD-10-CM | POA: Diagnosis not present

## 2019-03-28 DIAGNOSIS — M5441 Lumbago with sciatica, right side: Secondary | ICD-10-CM | POA: Diagnosis not present

## 2019-03-28 DIAGNOSIS — I10 Essential (primary) hypertension: Secondary | ICD-10-CM | POA: Diagnosis not present

## 2019-04-15 DIAGNOSIS — E1140 Type 2 diabetes mellitus with diabetic neuropathy, unspecified: Secondary | ICD-10-CM | POA: Diagnosis not present

## 2019-04-15 DIAGNOSIS — G6289 Other specified polyneuropathies: Secondary | ICD-10-CM | POA: Diagnosis not present

## 2019-04-15 DIAGNOSIS — M5418 Radiculopathy, sacral and sacrococcygeal region: Secondary | ICD-10-CM | POA: Diagnosis not present

## 2019-04-15 DIAGNOSIS — M5441 Lumbago with sciatica, right side: Secondary | ICD-10-CM | POA: Diagnosis not present

## 2019-04-15 DIAGNOSIS — Z981 Arthrodesis status: Secondary | ICD-10-CM | POA: Diagnosis not present

## 2019-04-15 DIAGNOSIS — M5442 Lumbago with sciatica, left side: Secondary | ICD-10-CM | POA: Diagnosis not present

## 2019-04-15 DIAGNOSIS — M5416 Radiculopathy, lumbar region: Secondary | ICD-10-CM | POA: Diagnosis not present

## 2019-04-15 DIAGNOSIS — G8929 Other chronic pain: Secondary | ICD-10-CM | POA: Diagnosis not present

## 2019-04-30 DIAGNOSIS — Z981 Arthrodesis status: Secondary | ICD-10-CM | POA: Diagnosis not present

## 2019-04-30 DIAGNOSIS — M5416 Radiculopathy, lumbar region: Secondary | ICD-10-CM | POA: Diagnosis not present

## 2019-06-20 DIAGNOSIS — Z23 Encounter for immunization: Secondary | ICD-10-CM | POA: Diagnosis not present

## 2019-08-12 DIAGNOSIS — F325 Major depressive disorder, single episode, in full remission: Secondary | ICD-10-CM | POA: Diagnosis not present

## 2019-08-12 DIAGNOSIS — M48062 Spinal stenosis, lumbar region with neurogenic claudication: Secondary | ICD-10-CM | POA: Diagnosis not present

## 2019-08-12 DIAGNOSIS — E785 Hyperlipidemia, unspecified: Secondary | ICD-10-CM | POA: Diagnosis not present

## 2019-08-12 DIAGNOSIS — I10 Essential (primary) hypertension: Secondary | ICD-10-CM | POA: Diagnosis not present

## 2019-08-12 DIAGNOSIS — E119 Type 2 diabetes mellitus without complications: Secondary | ICD-10-CM | POA: Diagnosis not present

## 2019-08-12 DIAGNOSIS — E039 Hypothyroidism, unspecified: Secondary | ICD-10-CM | POA: Diagnosis not present

## 2020-01-01 DIAGNOSIS — Z981 Arthrodesis status: Secondary | ICD-10-CM | POA: Diagnosis not present

## 2020-01-01 DIAGNOSIS — I10 Essential (primary) hypertension: Secondary | ICD-10-CM | POA: Diagnosis not present

## 2020-01-01 DIAGNOSIS — M5418 Radiculopathy, sacral and sacrococcygeal region: Secondary | ICD-10-CM | POA: Diagnosis not present

## 2020-01-01 DIAGNOSIS — M545 Low back pain: Secondary | ICD-10-CM | POA: Diagnosis not present

## 2020-01-01 DIAGNOSIS — M5416 Radiculopathy, lumbar region: Secondary | ICD-10-CM | POA: Diagnosis not present

## 2020-01-01 DIAGNOSIS — E1142 Type 2 diabetes mellitus with diabetic polyneuropathy: Secondary | ICD-10-CM | POA: Diagnosis not present

## 2020-01-01 DIAGNOSIS — E039 Hypothyroidism, unspecified: Secondary | ICD-10-CM | POA: Diagnosis not present

## 2020-01-01 DIAGNOSIS — E1140 Type 2 diabetes mellitus with diabetic neuropathy, unspecified: Secondary | ICD-10-CM | POA: Diagnosis not present

## 2020-01-01 DIAGNOSIS — E785 Hyperlipidemia, unspecified: Secondary | ICD-10-CM | POA: Diagnosis not present

## 2020-01-01 DIAGNOSIS — R202 Paresthesia of skin: Secondary | ICD-10-CM | POA: Diagnosis not present

## 2020-01-02 DIAGNOSIS — H2511 Age-related nuclear cataract, right eye: Secondary | ICD-10-CM | POA: Diagnosis not present

## 2020-01-02 DIAGNOSIS — E119 Type 2 diabetes mellitus without complications: Secondary | ICD-10-CM | POA: Diagnosis not present

## 2020-01-03 DIAGNOSIS — Z20822 Contact with and (suspected) exposure to covid-19: Secondary | ICD-10-CM | POA: Diagnosis not present

## 2020-01-03 DIAGNOSIS — Z01812 Encounter for preprocedural laboratory examination: Secondary | ICD-10-CM | POA: Diagnosis not present

## 2020-01-06 DIAGNOSIS — Z7984 Long term (current) use of oral hypoglycemic drugs: Secondary | ICD-10-CM | POA: Diagnosis not present

## 2020-01-06 DIAGNOSIS — Z87891 Personal history of nicotine dependence: Secondary | ICD-10-CM | POA: Diagnosis not present

## 2020-01-06 DIAGNOSIS — E039 Hypothyroidism, unspecified: Secondary | ICD-10-CM | POA: Diagnosis not present

## 2020-01-06 DIAGNOSIS — Z882 Allergy status to sulfonamides status: Secondary | ICD-10-CM | POA: Diagnosis not present

## 2020-01-06 DIAGNOSIS — E785 Hyperlipidemia, unspecified: Secondary | ICD-10-CM | POA: Diagnosis not present

## 2020-01-06 DIAGNOSIS — E1136 Type 2 diabetes mellitus with diabetic cataract: Secondary | ICD-10-CM | POA: Diagnosis not present

## 2020-01-06 DIAGNOSIS — I10 Essential (primary) hypertension: Secondary | ICD-10-CM | POA: Diagnosis not present

## 2020-01-06 DIAGNOSIS — H2511 Age-related nuclear cataract, right eye: Secondary | ICD-10-CM | POA: Diagnosis not present

## 2020-01-06 DIAGNOSIS — E669 Obesity, unspecified: Secondary | ICD-10-CM | POA: Diagnosis not present

## 2020-02-05 DIAGNOSIS — Z981 Arthrodesis status: Secondary | ICD-10-CM | POA: Diagnosis not present

## 2020-02-05 DIAGNOSIS — G5793 Unspecified mononeuropathy of bilateral lower limbs: Secondary | ICD-10-CM | POA: Diagnosis not present

## 2020-02-05 DIAGNOSIS — G629 Polyneuropathy, unspecified: Secondary | ICD-10-CM | POA: Diagnosis not present

## 2020-02-05 DIAGNOSIS — M5416 Radiculopathy, lumbar region: Secondary | ICD-10-CM | POA: Diagnosis not present

## 2020-02-06 DIAGNOSIS — Z961 Presence of intraocular lens: Secondary | ICD-10-CM | POA: Diagnosis not present

## 2020-02-14 DIAGNOSIS — F325 Major depressive disorder, single episode, in full remission: Secondary | ICD-10-CM | POA: Diagnosis not present

## 2020-02-14 DIAGNOSIS — E538 Deficiency of other specified B group vitamins: Secondary | ICD-10-CM | POA: Diagnosis not present

## 2020-02-14 DIAGNOSIS — E119 Type 2 diabetes mellitus without complications: Secondary | ICD-10-CM | POA: Diagnosis not present

## 2020-02-14 DIAGNOSIS — Z0000 Encounter for general adult medical examination without abnormal findings: Secondary | ICD-10-CM | POA: Diagnosis not present

## 2020-02-14 DIAGNOSIS — E785 Hyperlipidemia, unspecified: Secondary | ICD-10-CM | POA: Diagnosis not present

## 2020-02-14 DIAGNOSIS — M48062 Spinal stenosis, lumbar region with neurogenic claudication: Secondary | ICD-10-CM | POA: Diagnosis not present

## 2020-02-14 DIAGNOSIS — Z1331 Encounter for screening for depression: Secondary | ICD-10-CM | POA: Diagnosis not present

## 2020-02-14 DIAGNOSIS — E039 Hypothyroidism, unspecified: Secondary | ICD-10-CM | POA: Diagnosis not present

## 2020-02-14 DIAGNOSIS — I10 Essential (primary) hypertension: Secondary | ICD-10-CM | POA: Diagnosis not present

## 2020-03-16 DIAGNOSIS — G5793 Unspecified mononeuropathy of bilateral lower limbs: Secondary | ICD-10-CM | POA: Diagnosis not present

## 2020-03-16 DIAGNOSIS — Z981 Arthrodesis status: Secondary | ICD-10-CM | POA: Diagnosis not present

## 2020-03-16 DIAGNOSIS — M5416 Radiculopathy, lumbar region: Secondary | ICD-10-CM | POA: Diagnosis not present

## 2020-06-16 DIAGNOSIS — Z23 Encounter for immunization: Secondary | ICD-10-CM | POA: Diagnosis not present

## 2020-08-17 DIAGNOSIS — E119 Type 2 diabetes mellitus without complications: Secondary | ICD-10-CM | POA: Diagnosis not present

## 2020-08-17 DIAGNOSIS — E785 Hyperlipidemia, unspecified: Secondary | ICD-10-CM | POA: Diagnosis not present

## 2020-08-17 DIAGNOSIS — E039 Hypothyroidism, unspecified: Secondary | ICD-10-CM | POA: Diagnosis not present

## 2020-08-17 DIAGNOSIS — I10 Essential (primary) hypertension: Secondary | ICD-10-CM | POA: Diagnosis not present

## 2020-08-17 DIAGNOSIS — F419 Anxiety disorder, unspecified: Secondary | ICD-10-CM | POA: Diagnosis not present

## 2020-09-17 DIAGNOSIS — N289 Disorder of kidney and ureter, unspecified: Secondary | ICD-10-CM | POA: Diagnosis not present

## 2020-12-27 ENCOUNTER — Other Ambulatory Visit: Payer: Self-pay

## 2020-12-27 ENCOUNTER — Inpatient Hospital Stay
Admission: EM | Admit: 2020-12-27 | Discharge: 2021-01-11 | DRG: 492 | Disposition: A | Discharge status: Skilled Nursing Facility | Payer: Medicare HMO | Attending: Internal Medicine | Admitting: Internal Medicine

## 2020-12-27 ENCOUNTER — Emergency Department: Payer: Medicare HMO

## 2020-12-27 ENCOUNTER — Encounter: Payer: Self-pay | Admitting: Radiology

## 2020-12-27 DIAGNOSIS — Y92008 Other place in unspecified non-institutional (private) residence as the place of occurrence of the external cause: Secondary | ICD-10-CM

## 2020-12-27 DIAGNOSIS — D509 Iron deficiency anemia, unspecified: Secondary | ICD-10-CM | POA: Diagnosis present

## 2020-12-27 DIAGNOSIS — G8918 Other acute postprocedural pain: Secondary | ICD-10-CM | POA: Diagnosis not present

## 2020-12-27 DIAGNOSIS — E161 Other hypoglycemia: Secondary | ICD-10-CM | POA: Diagnosis not present

## 2020-12-27 DIAGNOSIS — Z9889 Other specified postprocedural states: Secondary | ICD-10-CM

## 2020-12-27 DIAGNOSIS — I6782 Cerebral ischemia: Secondary | ICD-10-CM | POA: Diagnosis not present

## 2020-12-27 DIAGNOSIS — N189 Chronic kidney disease, unspecified: Secondary | ICD-10-CM

## 2020-12-27 DIAGNOSIS — Z79890 Hormone replacement therapy (postmenopausal): Secondary | ICD-10-CM | POA: Diagnosis not present

## 2020-12-27 DIAGNOSIS — R278 Other lack of coordination: Secondary | ICD-10-CM | POA: Diagnosis not present

## 2020-12-27 DIAGNOSIS — G9341 Metabolic encephalopathy: Secondary | ICD-10-CM | POA: Diagnosis present

## 2020-12-27 DIAGNOSIS — Z7982 Long term (current) use of aspirin: Secondary | ICD-10-CM

## 2020-12-27 DIAGNOSIS — E1122 Type 2 diabetes mellitus with diabetic chronic kidney disease: Secondary | ICD-10-CM | POA: Diagnosis present

## 2020-12-27 DIAGNOSIS — E875 Hyperkalemia: Secondary | ICD-10-CM

## 2020-12-27 DIAGNOSIS — D649 Anemia, unspecified: Secondary | ICD-10-CM | POA: Diagnosis not present

## 2020-12-27 DIAGNOSIS — I959 Hypotension, unspecified: Secondary | ICD-10-CM | POA: Diagnosis not present

## 2020-12-27 DIAGNOSIS — S82302A Unspecified fracture of lower end of left tibia, initial encounter for closed fracture: Secondary | ICD-10-CM | POA: Diagnosis not present

## 2020-12-27 DIAGNOSIS — W19XXXA Unspecified fall, initial encounter: Secondary | ICD-10-CM | POA: Diagnosis not present

## 2020-12-27 DIAGNOSIS — E039 Hypothyroidism, unspecified: Secondary | ICD-10-CM | POA: Diagnosis present

## 2020-12-27 DIAGNOSIS — S82892A Other fracture of left lower leg, initial encounter for closed fracture: Secondary | ICD-10-CM | POA: Diagnosis not present

## 2020-12-27 DIAGNOSIS — R55 Syncope and collapse: Secondary | ICD-10-CM

## 2020-12-27 DIAGNOSIS — E1142 Type 2 diabetes mellitus with diabetic polyneuropathy: Secondary | ICD-10-CM | POA: Diagnosis present

## 2020-12-27 DIAGNOSIS — Z79899 Other long term (current) drug therapy: Secondary | ICD-10-CM | POA: Diagnosis not present

## 2020-12-27 DIAGNOSIS — R262 Difficulty in walking, not elsewhere classified: Secondary | ICD-10-CM | POA: Diagnosis not present

## 2020-12-27 DIAGNOSIS — F32A Depression, unspecified: Secondary | ICD-10-CM | POA: Diagnosis present

## 2020-12-27 DIAGNOSIS — R21 Rash and other nonspecific skin eruption: Secondary | ICD-10-CM | POA: Diagnosis not present

## 2020-12-27 DIAGNOSIS — Z20822 Contact with and (suspected) exposure to covid-19: Secondary | ICD-10-CM | POA: Diagnosis present

## 2020-12-27 DIAGNOSIS — I129 Hypertensive chronic kidney disease with stage 1 through stage 4 chronic kidney disease, or unspecified chronic kidney disease: Secondary | ICD-10-CM | POA: Diagnosis present

## 2020-12-27 DIAGNOSIS — T383X1A Poisoning by insulin and oral hypoglycemic [antidiabetic] drugs, accidental (unintentional), initial encounter: Secondary | ICD-10-CM | POA: Diagnosis not present

## 2020-12-27 DIAGNOSIS — S82892S Other fracture of left lower leg, sequela: Secondary | ICD-10-CM | POA: Diagnosis not present

## 2020-12-27 DIAGNOSIS — N179 Acute kidney failure, unspecified: Secondary | ICD-10-CM

## 2020-12-27 DIAGNOSIS — S8262XD Displaced fracture of lateral malleolus of left fibula, subsequent encounter for closed fracture with routine healing: Secondary | ICD-10-CM | POA: Diagnosis not present

## 2020-12-27 DIAGNOSIS — W5503XA Scratched by cat, initial encounter: Secondary | ICD-10-CM

## 2020-12-27 DIAGNOSIS — I10 Essential (primary) hypertension: Secondary | ICD-10-CM | POA: Diagnosis not present

## 2020-12-27 DIAGNOSIS — N182 Chronic kidney disease, stage 2 (mild): Secondary | ICD-10-CM | POA: Diagnosis present

## 2020-12-27 DIAGNOSIS — R0689 Other abnormalities of breathing: Secondary | ICD-10-CM | POA: Diagnosis not present

## 2020-12-27 DIAGNOSIS — E119 Type 2 diabetes mellitus without complications: Secondary | ICD-10-CM

## 2020-12-27 DIAGNOSIS — R279 Unspecified lack of coordination: Secondary | ICD-10-CM | POA: Diagnosis not present

## 2020-12-27 DIAGNOSIS — M6281 Muscle weakness (generalized): Secondary | ICD-10-CM | POA: Diagnosis not present

## 2020-12-27 DIAGNOSIS — S82842A Displaced bimalleolar fracture of left lower leg, initial encounter for closed fracture: Secondary | ICD-10-CM | POA: Diagnosis not present

## 2020-12-27 DIAGNOSIS — E785 Hyperlipidemia, unspecified: Secondary | ICD-10-CM | POA: Diagnosis present

## 2020-12-27 DIAGNOSIS — E11649 Type 2 diabetes mellitus with hypoglycemia without coma: Secondary | ICD-10-CM | POA: Diagnosis present

## 2020-12-27 DIAGNOSIS — Z043 Encounter for examination and observation following other accident: Secondary | ICD-10-CM | POA: Diagnosis not present

## 2020-12-27 DIAGNOSIS — Z7984 Long term (current) use of oral hypoglycemic drugs: Secondary | ICD-10-CM | POA: Diagnosis not present

## 2020-12-27 DIAGNOSIS — S82842D Displaced bimalleolar fracture of left lower leg, subsequent encounter for closed fracture with routine healing: Secondary | ICD-10-CM | POA: Diagnosis not present

## 2020-12-27 DIAGNOSIS — S82852A Displaced trimalleolar fracture of left lower leg, initial encounter for closed fracture: Principal | ICD-10-CM

## 2020-12-27 DIAGNOSIS — Z01818 Encounter for other preprocedural examination: Secondary | ICD-10-CM

## 2020-12-27 DIAGNOSIS — R41841 Cognitive communication deficit: Secondary | ICD-10-CM | POA: Diagnosis not present

## 2020-12-27 DIAGNOSIS — F419 Anxiety disorder, unspecified: Secondary | ICD-10-CM | POA: Diagnosis present

## 2020-12-27 DIAGNOSIS — E782 Mixed hyperlipidemia: Secondary | ICD-10-CM | POA: Diagnosis not present

## 2020-12-27 DIAGNOSIS — E160 Drug-induced hypoglycemia without coma: Secondary | ICD-10-CM | POA: Diagnosis not present

## 2020-12-27 DIAGNOSIS — R531 Weakness: Secondary | ICD-10-CM

## 2020-12-27 DIAGNOSIS — E1169 Type 2 diabetes mellitus with other specified complication: Secondary | ICD-10-CM | POA: Diagnosis present

## 2020-12-27 DIAGNOSIS — L538 Other specified erythematous conditions: Secondary | ICD-10-CM | POA: Diagnosis present

## 2020-12-27 DIAGNOSIS — W1839XA Other fall on same level, initial encounter: Secondary | ICD-10-CM | POA: Diagnosis present

## 2020-12-27 DIAGNOSIS — R404 Transient alteration of awareness: Secondary | ICD-10-CM | POA: Diagnosis not present

## 2020-12-27 DIAGNOSIS — Z8781 Personal history of (healed) traumatic fracture: Secondary | ICD-10-CM

## 2020-12-27 DIAGNOSIS — M25572 Pain in left ankle and joints of left foot: Secondary | ICD-10-CM | POA: Diagnosis not present

## 2020-12-27 DIAGNOSIS — Z743 Need for continuous supervision: Secondary | ICD-10-CM | POA: Diagnosis not present

## 2020-12-27 DIAGNOSIS — M7989 Other specified soft tissue disorders: Secondary | ICD-10-CM | POA: Diagnosis not present

## 2020-12-27 DIAGNOSIS — Z419 Encounter for procedure for purposes other than remedying health state, unspecified: Secondary | ICD-10-CM

## 2020-12-27 DIAGNOSIS — E162 Hypoglycemia, unspecified: Secondary | ICD-10-CM | POA: Diagnosis not present

## 2020-12-27 DIAGNOSIS — S8252XA Displaced fracture of medial malleolus of left tibia, initial encounter for closed fracture: Secondary | ICD-10-CM | POA: Diagnosis not present

## 2020-12-27 DIAGNOSIS — S82852G Displaced trimalleolar fracture of left lower leg, subsequent encounter for closed fracture with delayed healing: Secondary | ICD-10-CM | POA: Diagnosis not present

## 2020-12-27 DIAGNOSIS — Z4789 Encounter for other orthopedic aftercare: Secondary | ICD-10-CM | POA: Diagnosis not present

## 2020-12-27 HISTORY — DX: Type 2 diabetes mellitus without complications: E11.9

## 2020-12-27 HISTORY — DX: Hyperlipidemia, unspecified: E78.5

## 2020-12-27 HISTORY — DX: Essential (primary) hypertension: I10

## 2020-12-27 LAB — COMPREHENSIVE METABOLIC PANEL
ALT: 12 U/L (ref 0–44)
AST: 24 U/L (ref 15–41)
Albumin: 3.7 g/dL (ref 3.5–5.0)
Alkaline Phosphatase: 78 U/L (ref 38–126)
Anion gap: 5 (ref 5–15)
BUN: 38 mg/dL — ABNORMAL HIGH (ref 8–23)
CO2: 24 mmol/L (ref 22–32)
Calcium: 8.5 mg/dL — ABNORMAL LOW (ref 8.9–10.3)
Chloride: 108 mmol/L (ref 98–111)
Creatinine, Ser: 1.72 mg/dL — ABNORMAL HIGH (ref 0.61–1.24)
GFR, Estimated: 40 mL/min — ABNORMAL LOW (ref >60)
Glucose, Bld: 39 mg/dL — CL (ref 70–99)
Potassium: 4.8 mmol/L (ref 3.5–5.1)
Sodium: 137 mmol/L (ref 135–145)
Total Bilirubin: 0.6 mg/dL (ref 0.3–1.2)
Total Protein: 6.8 g/dL (ref 6.5–8.1)

## 2020-12-27 LAB — TSH: TSH: 1.614 u[IU]/mL (ref 0.350–4.500)

## 2020-12-27 LAB — CBC
HCT: 32.4 % — ABNORMAL LOW (ref 39.0–52.0)
Hemoglobin: 10.2 g/dL — ABNORMAL LOW (ref 13.0–17.0)
MCH: 31.3 pg (ref 26.0–34.0)
MCHC: 31.5 g/dL (ref 30.0–36.0)
MCV: 99.4 fL (ref 80.0–100.0)
Platelets: 246 10*3/uL (ref 150–400)
RBC: 3.26 MIL/uL — ABNORMAL LOW (ref 4.22–5.81)
RDW: 13.9 % (ref 11.5–15.5)
WBC: 6.5 10*3/uL (ref 4.0–10.5)
nRBC: 0 % (ref 0.0–0.2)

## 2020-12-27 LAB — GLUCOSE, CAPILLARY: Glucose-Capillary: 121 mg/dL — ABNORMAL HIGH (ref 70–99)

## 2020-12-27 LAB — CBG MONITORING, ED: Glucose-Capillary: 75 mg/dL (ref 70–99)

## 2020-12-27 LAB — TROPONIN I (HIGH SENSITIVITY): Troponin I (High Sensitivity): 5 ng/L (ref <18)

## 2020-12-27 LAB — HEMOGLOBIN A1C
Hgb A1c MFr Bld: 6.1 % — ABNORMAL HIGH (ref 4.8–5.6)
Mean Plasma Glucose: 128.37 mg/dL

## 2020-12-27 MED ORDER — HYDRALAZINE HCL 25 MG PO TABS: 25.0000 mg | ORAL_TABLET | Freq: Three times a day (TID) | ORAL | Status: AC | PRN

## 2020-12-27 MED ORDER — HYDROCODONE-ACETAMINOPHEN 5-325 MG PO TABS: 1.0000 | ORAL_TABLET | Freq: Four times a day (QID) | ORAL | Status: DC | PRN

## 2020-12-27 MED ORDER — INSULIN ASPART 100 UNIT/ML ~~LOC~~ SOLN
0.0000 [IU] | Freq: Three times a day (TID) | SUBCUTANEOUS | Status: DC
  Administered 2020-12-30: 2 [IU] via SUBCUTANEOUS
  Administered 2020-12-30: 3 [IU] via SUBCUTANEOUS
  Administered 2021-01-01 – 2021-01-03 (×7): 1 [IU] via SUBCUTANEOUS
  Administered 2021-01-03: 2 [IU] via SUBCUTANEOUS
  Administered 2021-01-03: 1 [IU] via SUBCUTANEOUS
  Administered 2021-01-04 (×2): 2 [IU] via SUBCUTANEOUS
  Administered 2021-01-04: 1 [IU] via SUBCUTANEOUS
  Administered 2021-01-05: 2 [IU] via SUBCUTANEOUS
  Administered 2021-01-05: 1 [IU] via SUBCUTANEOUS
  Administered 2021-01-05: 2 [IU] via SUBCUTANEOUS
  Administered 2021-01-07: 1 [IU] via SUBCUTANEOUS
  Administered 2021-01-07 (×2): 2 [IU] via SUBCUTANEOUS
  Administered 2021-01-08: 3 [IU] via SUBCUTANEOUS
  Administered 2021-01-09 (×2): 2 [IU] via SUBCUTANEOUS
  Administered 2021-01-09: 3 [IU] via SUBCUTANEOUS
  Administered 2021-01-10 (×2): 2 [IU] via SUBCUTANEOUS
  Administered 2021-01-10 – 2021-01-11 (×2): 1 [IU] via SUBCUTANEOUS
  Administered 2021-01-11: 2 [IU] via SUBCUTANEOUS
  Filled 2020-12-27 (×29): qty 1

## 2020-12-27 MED ORDER — ESCITALOPRAM OXALATE 20 MG PO TABS
20.0000 mg | ORAL_TABLET | Freq: Every day | ORAL | Status: DC
  Administered 2020-12-30 – 2021-01-11 (×12): 20 mg via ORAL
  Filled 2020-12-27 (×3): qty 1
  Filled 2020-12-27: qty 2
  Filled 2020-12-27 (×7): qty 1
  Filled 2020-12-27: qty 2
  Filled 2020-12-27 (×3): qty 1

## 2020-12-27 MED ORDER — MORPHINE SULFATE (PF) 2 MG/ML IV SOLN
0.5000 mg | INTRAVENOUS | Status: DC | PRN
Start: 2020-12-27 — End: 2020-12-28
  Administered 2020-12-28 (×2): 0.5 mg via INTRAVENOUS
  Filled 2020-12-27 (×2): qty 1

## 2020-12-27 MED ORDER — DEXTROSE 50 % IV SOLN: 1.0000 | Freq: Once | INTRAVENOUS | Status: AC

## 2020-12-27 MED ORDER — AMLODIPINE BESYLATE 5 MG PO TABS: 5.0000 mg | ORAL_TABLET | Freq: Every day | ORAL | Status: DC

## 2020-12-27 MED ORDER — ACETAMINOPHEN 325 MG PO TABS
650.0000 mg | ORAL_TABLET | Freq: Four times a day (QID) | ORAL | Status: AC | PRN
  Administered 2020-12-27 – 2020-12-30 (×2): 650 mg via ORAL
  Filled 2020-12-27 (×2): qty 2

## 2020-12-27 MED ORDER — DEXTROSE 50 % IV SOLN
INTRAVENOUS | Status: AC
  Administered 2020-12-27: 50 mL via INTRAVENOUS
  Filled 2020-12-27: qty 50

## 2020-12-27 MED ORDER — INSULIN ASPART 100 UNIT/ML ~~LOC~~ SOLN
0.0000 [IU] | Freq: Every day | SUBCUTANEOUS | Status: DC
  Administered 2020-12-29: 3 [IU] via SUBCUTANEOUS
  Filled 2020-12-27: qty 1

## 2020-12-27 MED ORDER — KETOROLAC TROMETHAMINE 30 MG/ML IJ SOLN
15.0000 mg | Freq: Four times a day (QID) | INTRAMUSCULAR | Status: DC | PRN
  Administered 2020-12-27 – 2020-12-28 (×2): 15 mg via INTRAVENOUS
  Filled 2020-12-27 (×2): qty 1

## 2020-12-27 MED ORDER — DEXTROSE 50 % IV SOLN: 1.0000 | INTRAVENOUS | Status: AC | PRN

## 2020-12-27 MED ORDER — ATORVASTATIN CALCIUM 20 MG PO TABS
80.0000 mg | ORAL_TABLET | Freq: Every day | ORAL | Status: DC
  Filled 2020-12-27: qty 4

## 2020-12-27 MED ORDER — LEVOTHYROXINE SODIUM 25 MCG PO TABS
25.0000 ug | ORAL_TABLET | Freq: Every day | ORAL | Status: DC
  Administered 2020-12-30 – 2021-01-11 (×13): 25 ug via ORAL
  Filled 2020-12-27 (×13): qty 1

## 2020-12-27 MED ORDER — SODIUM CHLORIDE 0.9 % IV SOLN: INTRAVENOUS | Status: AC

## 2020-12-27 NOTE — ED Notes (Signed)
Patient provided with meal tray, Dr. Sedalia Muta at bedside as well as patients son.

## 2020-12-27 NOTE — ED Triage Notes (Signed)
Patient reports he stood up from couch and started to walk when he had syncopal episode and fell. Unwitnessed. Patient noted to have left ankle swelling. Patient A&OX3.  20G LAC and Fentanyl 25 mcg given by EMS.

## 2020-12-27 NOTE — H&P (Signed)
History and Physical   Brad Santiago UEA:[id number] DOB: 08/10/1941 DOA: 12/27/2020  PCP: Dorothey BasemanBronstein, David, MD  Patient coming from: home  I have personally briefly reviewed patient[facility].  Chief Concern: Syncope  HPI: Brad Santiago is a 80 y.o. male with medical history significant for hypertension, non-insulin-dependent diabetes mellitus, hypothyroid, hyperlipidemia, peripheral neuropathy, presents to the emergency department for chief concerns of passing out.  Patient states he was sitting down on his chair when he felt weak in the last thing he remembered was seen the coffee table about to catch his chin as he fell forward. He woke up on the floor.  He states he does not remember anything else.  His son at bedside states that when his father came to and his father called him.  Mr. Brad Santiago reports that he has not had his medications on day of presentation.  He states that the last time he took his medications was yesterday at 3:30 PM, and one of his DM medications and gabapentin at night at approximately midnight.  He reports he did not eat anything before ED presentation. His last meal was approximately 10/11 pm on 12/26/2020.   He normally wakes up 1 pm. He normally falls asleep at 3 AM, because he stays up to watch murder serious on TV. He states that normally he eats lunch/supper around 3 pm - 4 pm, and dinner around 10/11 PM.  He did not have his supper today because he passed out.   Social history: lives at home with two cats. His cats like to scratch patient's left lower extremity only. He denies tobacco and recreational drugs. He seldom drinks etoh, his last sip of whiskey was 3-4 weeks ago. His sister gave him 1/2 gallon of whiskey two years ago, and he's had about 1/2 cup from the whiskey in two years.  Vaccination: He is vaccinated with pfizer, three doses for covid 19.  ROS: Constitutional: no weight change, no fever ENT/Mouth: no sore throat, no  rhinorrhea Eyes: no eye pain, no vision changes Cardiovascular: no chest pain, no dyspnea,  no edema, no palpitations Respiratory: no cough, no sputum, no wheezing Gastrointestinal: no nausea, no vomiting, no diarrhea, no constipation Genitourinary: no urinary incontinence, no dysuria, no hematuria Musculoskeletal: no arthralgias, no myalgias Skin: no skin lesions, no pruritus, Neuro: + weakness, no loss of consciousness, no syncope Psych: no anxiety, no depression, + decrease appetite Heme/Lymph: no bruising, no bleeding  ED Course: Discussed with emergency medicine provider, patient requiring hospitalization for syncope.  Vitals in the emergency department was remarkable for temperature of 97.7, respiration rate of 20, heart rate 62, blood pressure 128/86, SPO2 of 99% on room air.  Labs in the emergency department was remarkable for sodium level 137, potassium 4.8, chloride 108, bicarb 24, BUN 38, serum creatinine of 1.372, nonfasting blood glucose on presentation was 39 and improved to 75 amp of dextrose 50.  Troponin initially in the emergency department was 5.  Assessment/Plan  Active Problems:   Closed left ankle fracture   Diabetes mellitus type 2, noninsulin dependent (HCC)   Essential hypertension   Hyperlipidemia   Acquired hypothyroidism   AKI (acute kidney injury) (HCC)   # Syncope- likely secondary to hypoglycemia - Status post 1 amp dextrose - At bedside patient is awake alert and oriented x4, does not require dextrose gtt. at this time - Dextrose 50 IV as needed for low blood sugar ordered - Normal saline 125 mL/h, 16  hours ordered - Heart healthy/carb modified diet at this time - No clinical indication for echocardiogram at this time - Checking TSH, added to previous labs collected  # Closed left ankle fracture-present on admission secondary to syncope -Patient is status post splinting per EDP - EDP called Dr. Okey Dupre and she is aware - N.p.o. after  midnight - Pain control with acetaminophen 650 mg p.o. every 6 hours as needed for mild pain, fever, headache, ketorolac 50 mg IV every 6 hours as needed for moderate pain, morphine 0.5 mg IV every 2 hours as needed for severe pain ordered  # Elevated serum creatinine-no previous CKD on labs in 2014 and 2013 - Serum creatinine on presentation was 1.72, so this may be chronic changes versus acute kidney injury - Normal saline 125 mL/h, 16 hours ordered - BMP in the a.m.  # Non-insulin-dependent diabetes mellitus- holding home glipizide-metformin-Actos - Check A1c, given patient's age, A1c goal to be less than 7.5 - Recommend on discharge to discontinue glipizide and Actos - Insulin sliding scale with at bedtime coverage ordered - Dextrose 50 as needed for hypoglycemia  # Hyperlipidemia-atorvastatin 80 mg nightly resumed  # Hypertension- Controlled despite patient not taking any antihypertensives today - Amlodipine 5 mg p.o. resumed for 12/28/2020 - Hydralazine 25 mg p.o. every 8 hours as needed for SBP greater than 175  # Depression/anxiety-escitalopram 20 mg daily resumed  # Left lower extremity skin scratches-present on admission secondary to patient's cats  # DVT prophylaxis-pharmacologic DVT prophylaxis has not been initiated due to anticipation of possible orthopedic intervention - A.m. team to initiate pharmacologic DVT when it is appropriate  Chart reviewed.   DVT prophylaxis: TED hose to the right lower extremity Code Status: Full code Diet: Heart healthy/carb modified now, n.p.o. after midnight Family Communication: Updated son at bedside Disposition Plan: Pending clinical course Consults called: Orthopedic, Dr. Okey Dupre via EDP Admission status: MedSurg, observation, with telemetry  Past medical history: Hypertension, hyperlipidemia, hypothyroid, non-insulin-dependent diabetes mellitus  Social History:  has no history on file for tobacco use, alcohol use, and drug  use.  Allergies  Allergen Reactions  . Sulfa Antibiotics    No family history on file. Family history: Family history reviewed and not pertinent  Prior to Admission medications   Medication Sig Start Date End Date Taking? Authorizing Provider  amLODipine (NORVASC) 5 MG tablet Take 5 mg by mouth daily.    [provider]  aspirin EC 81 MG tablet Take 81 mg by mouth daily.    [provider]  atorvastatin (LIPITOR) 80 MG tablet Take 80 mg by mouth daily at 6 PM.    [provider]  escitalopram (LEXAPRO) 20 MG tablet Take 20 mg by mouth daily.    [provider]  furosemide (LASIX) 20 MG tablet Take 20 mg by mouth daily.    [provider]  gabapentin (NEURONTIN) 300 MG capsule Take 300 mg by mouth at bedtime. Patient states he takes differently.  States he takes 2 at night time.    [provider]  glipiZIDE-metformin (METAGLIP) 5-500 MG tablet Take 2 tablets by mouth 2 (two) times daily before a meal.    [provider]  levothyroxine (SYNTHROID, LEVOTHROID) 25 MCG tablet Take 25 mcg by mouth daily before breakfast.    [provider]  magnesium oxide (MAG-OX) 400 MG tablet Take 400 mg by mouth daily.    [provider]  oxymetazoline (AFRIN) 0.05 % nasal spray Place 1 spray into both  nostrils. Patient states uses 1 time per day    [provider]  pioglitazone (ACTOS) 30 MG tablet Take 30 mg by mouth daily.    [provider]  polyethylene glycol (MIRALAX / GLYCOLAX) packet Take 17 g by mouth daily as needed.    [provider]   Physical Exam: Vitals:   12/27/20 1751 12/27/20 1755 12/27/20 1900 12/27/20 1940  BP:   111/68 128/86  Pulse:   60 65  Resp:   19 18  Temp:  97.7 F (36.5 C)    TempSrc:  Oral    SpO2:   99% 100%  Weight: 104.3 kg     Height: 6' (1.829 m)      Constitutional: appears age appropriate, NAD, calm, comfortable Eyes: PERRL, lids and conjunctivae  normal ENMT: Mucous membranes are moist. Posterior pharynx clear of any exudate or lesions. Age-appropriate dentition. Hearing appropriate Neck: normal, supple, no masses, no thyromegaly Respiratory: clear to auscultation bilaterally, no wheezing, no crackles. Normal respiratory effort. No accessory muscle use.  Cardiovascular: Regular rate and rhythm, no murmurs / rubs / gallops. No extremity edema. 2+ pedal pulses. No carotid bruits.  Abdomen: no tenderness, no masses palpated, no hepatosplenomegaly. Bowel sounds positive.  Musculoskeletal: no clubbing / cyanosis. No joint deformity upper and lower extremities. Good ROM, no contractures, no atrophy. Normal muscle tone.  Skin: no rashes, lesions, ulcers. No induration Neurologic: Sensation intact. Strength 5/5 in all 4.  Psychiatric: Normal judgment and insight. Alert and oriented x 3. Normal mood.   EKG: independently reviewed, showing sinus rhythm with rate of 62, QTc 447, right bundle branch block.  Right bundle branch block was present on EKG on 08/29/2013  X-ray on Admission: I personally reviewed and I agree with radiologist reading as below.  DG Ankle 2 Views Left  Result Date: 12/27/2020 CLINICAL DATA:  Postreduction left ankle fractures. EXAM: LEFT ANKLE - 2 VIEW COMPARISON:  12/27/2020 FINDINGS: Interval placement of cast material which obscures some bone detail. Comminuted fractures are demonstrated in the medial, lateral, and posterior malleolus. Improved alignment of the fracture fragments postreduction but there is still about 6 mm lateral displacement of the talus and medial malleolar fragment with respect to the tibia, resulting in a gap in the articular surface. Degenerative changes in the intertarsal joints. IMPRESSION: Comminuted fractures of the medial, lateral, and posterior malleolus with improved alignment postreduction but persistent lateral displacement of the talus and medial malleolar fragment with respect to the tibia.  Electronically Signed   By: Burman Nieves M.D.   On: 12/27/2020 20:05   DG Ankle Complete Left  Result Date: 12/27/2020 CLINICAL DATA:  Ankle pain and swelling after fall EXAM: LEFT ANKLE COMPLETE - 3+ VIEW COMPARISON:  None. FINDINGS: Displaced fracture of the medial malleolus with apparent preservation of the medial clear space. Minimally displaced fracture of the posterior tibial plafond. Displaced fracture of the distal fibula with apparent preservation of the lateral clear space. Diffuse soft tissue swelling about the ankle. IMPRESSION: Displaced trimalleolar fracture with diffuse soft tissue swelling about the ankle. Electronically Signed   By: Maudry Mayhew MD   On: 12/27/2020 18:30   CT Head Wo Contrast  Result Date: 12/27/2020 CLINICAL DATA:  Fall EXAM: CT HEAD WITHOUT CONTRAST CT MAXILLOFACIAL WITHOUT CONTRAST TECHNIQUE: Multidetector CT imaging of the head and maxillofacial structures were performed using the standard protocol without intravenous contrast. Multiplanar CT image reconstructions of the maxillofacial structures were also generated. COMPARISON:  None. FINDINGS: CT HEAD FINDINGS  Brain: There is no mass, hemorrhage or extra-axial collection. There is generalized atrophy without lobar predilection. There is hypoattenuation of the periventricular white matter, most commonly indicating chronic ischemic microangiopathy. Vascular: No hyperdense vessel or unexpected vascular calcification. Skull: The visualized skull base, calvarium and extracranial soft tissues are normal. CT MAXILLOFACIAL FINDINGS Osseous: --Complex facial fracture types: No LeFort, zygomaticomaxillary complex or nasoorbitoethmoidal fracture. --Simple fracture types: None. --Mandible, hard palate and teeth: No acute abnormality. Two areas of cystic change in the maxilla at the roots of absent incisors. Orbits: The globes and optic nerves are intact. Normal extraocular muscles and intraorbital fat. Sinuses: No acute  finding. Soft tissues: Normal visualized extracranial soft tissues. IMPRESSION: 1. Chronic ischemic microangiopathy and generalized atrophy without acute intracranial abnormality. 2. No facial fracture. Electronically Signed   By: Deatra Robinson M.D.   On: 12/27/2020 19:13   CT Maxillofacial Wo Contrast  Result Date: 12/27/2020 CLINICAL DATA:  Fall EXAM: CT HEAD WITHOUT CONTRAST CT MAXILLOFACIAL WITHOUT CONTRAST TECHNIQUE: Multidetector CT imaging of the head and maxillofacial structures were performed using the standard protocol without intravenous contrast. Multiplanar CT image reconstructions of the maxillofacial structures were also generated. COMPARISON:  None. FINDINGS: CT HEAD FINDINGS Brain: There is no mass, hemorrhage or extra-axial collection. There is generalized atrophy without lobar predilection. There is hypoattenuation of the periventricular white matter, most commonly indicating chronic ischemic microangiopathy. Vascular: No hyperdense vessel or unexpected vascular calcification. Skull: The visualized skull base, calvarium and extracranial soft tissues are normal. CT MAXILLOFACIAL FINDINGS Osseous: --Complex facial fracture types: No LeFort, zygomaticomaxillary complex or nasoorbitoethmoidal fracture. --Simple fracture types: None. --Mandible, hard palate and teeth: No acute abnormality. Two areas of cystic change in the maxilla at the roots of absent incisors. Orbits: The globes and optic nerves are intact. Normal extraocular muscles and intraorbital fat. Sinuses: No acute finding. Soft tissues: Normal visualized extracranial soft tissues. IMPRESSION: 1. Chronic ischemic microangiopathy and generalized atrophy without acute intracranial abnormality. 2. No facial fracture. Electronically Signed   By: Deatra Robinson M.D.   On: 12/27/2020 19:13   Labs on Admission: I have personally reviewed following labs  CBC: Recent Labs  Lab 12/27/20 1759  WBC 6.5  HGB 10.2*  HCT 32.4*  MCV 99.4  PLT  246   Basic Metabolic Panel: Recent Labs  Lab 12/27/20 1759  NA 137  K 4.8  CL 108  CO2 24  GLUCOSE 39*  BUN 38*  CREATININE 1.72*  CALCIUM 8.5*   GFR: Estimated Creatinine Clearance: 42.8 mL/min (A) (by C-G formula based on SCr of 1.72 mg/dL (H)).  Liver Function Tests: Recent Labs  Lab 12/27/20 1759  AST 24  ALT 12  ALKPHOS 78  BILITOT 0.6  PROT 6.8  ALBUMIN 3.7   CBG: Recent Labs  Lab 12/27/20 1941  GLUCAP 75   Urine analysis:    Component Value Date/Time   COLORURINE Yellow 06/14/2012 1228   APPEARANCEUR Cloudy 06/14/2012 1228   LABSPEC 1.017 06/14/2012 1228   PHURINE 6.0 06/14/2012 1228   GLUCOSEU 150 mg/dL 23/53/6144 3154   HGBUR 3+ 06/14/2012 1228   BILIRUBINUR Negative 06/14/2012 1228   KETONESUR Trace 06/14/2012 1228   PROTEINUR 100 mg/dL 00/86/7619 5093   NITRITE Negative 06/14/2012 1228   LEUKOCYTESUR 3+ 06/14/2012 1228   Tamilyn Lupien N Eulalio Reamy D.O. [facility]  If 7PM-7AM, please contact overnight-coverage provider If 7AM-7PM, please contact day coverage provider [email or web address]  12/27/2020, 9:07 PM

## 2020-12-27 NOTE — ED Notes (Signed)
Patient ankle re aligned by Dr. Cyril Loosen and short leg/stirrup splint applied to left ankle.

## 2020-12-27 NOTE — ED Provider Notes (Addendum)
[facility] Emergency Department Provider Note   ____________________________________________    I have reviewed the triage vital signs and the nursing notes.   HISTORY  Chief Complaint Ankle Pain and Fall (/)     HPI Brad Santiago is a 80 y.o. male who presents after a possible syncopal episode complaining primarily of left ankle pain.  Patient reports that he thinks he may have syncopized, he does remember hitting his chin on a table as he fell.  He is not on blood thinners reportedly.  Denies neck pain.  No back pain.  No abdominal pain or hip pain.  He complains primarily of left ankle pain which is clearly deformed.  He did receive fentanyl via EMS  History reviewed. No pertinent past medical history.  Patient Active Problem List   Diagnosis Date Noted  . Closed left ankle fracture 12/27/2020    No past surgical history on file.  Prior to Admission medications   Medication Sig Start Date End Date Taking? Authorizing Provider  amLODipine (NORVASC) 5 MG tablet Take 5 mg by mouth daily.    [provider]  aspirin EC 81 MG tablet Take 81 mg by mouth daily.    [provider]  atorvastatin (LIPITOR) 80 MG tablet Take 80 mg by mouth daily at 6 PM.    [provider]  escitalopram (LEXAPRO) 20 MG tablet Take 20 mg by mouth daily.    [provider]  furosemide (LASIX) 20 MG tablet Take 20 mg by mouth daily.    [provider]  gabapentin (NEURONTIN) 300 MG capsule Take 300 mg by mouth at bedtime. Patient states he takes differently.  States he takes 2 at night time.    [provider]  glipiZIDE-metformin (METAGLIP) 5-500 MG tablet Take 2 tablets by mouth 2 (two) times daily before a meal.    [provider]  levothyroxine (SYNTHROID, LEVOTHROID) 25 MCG tablet Take 25 mcg by mouth daily before breakfast.    [provider]  magnesium oxide (MAG-OX) 400 MG tablet Take 400 mg by  mouth daily.    [provider]  oxymetazoline (AFRIN) 0.05 % nasal spray Place 1 spray into both nostrils. Patient states uses 1 time per day    [provider]  pioglitazone (ACTOS) 30 MG tablet Take 30 mg by mouth daily.    [provider]  polyethylene glycol (MIRALAX / GLYCOLAX) packet Take 17 g by mouth daily as needed.    [provider]     Allergies Sulfa antibiotics  No family history on file.  Social History    Review of Systems  Constitutional: No fever/chills Eyes: No visual changes.  ENT: Mild pain to the chin Cardiovascular: Denies chest pain. Respiratory: Denies shortness of breath. Gastrointestinal: No abdominal pain.  No nausea, no vomiting.   Genitourinary: Negative for dysuria. Musculoskeletal: Left ankle pain Skin: Negative for rash.  Bruising left ankle Neurological: Negative for headaches or weakness   ____________________________________________   PHYSICAL EXAM:  VITAL SIGNS: ED Triage Vitals  Enc Vitals Group     BP 12/27/20 1750 (!) 117/53     Pulse Rate 12/27/20 1750 63     Resp 12/27/20 1750 16     Temp 12/27/20 1755 97.7 F (36.5 C)     Temp Source 12/27/20 1755 Oral     SpO2 12/27/20 1750 98 %     Weight 12/27/20 1751 104.3 kg (230 lb)     Height  12/27/20 1751 1.829 m (6')     Head Circumference --      Peak Flow --      Pain Score 12/27/20 1750 3     Pain Loc --      Pain Edu? --      Excl. in GC? --     Constitutional: Alert and oriented. . Pleasant and interactive Eyes: Conjunctivae are normal.  Head: Atraumatic. Nose: No congestion/rhinnorhea. Mouth/Throat: Mucous membranes are moist.    Cardiovascular: Normal rate, regular rhythm. Grossly normal heart sounds.  Good peripheral circulation. Respiratory: Normal respiratory effort.  No retractions. Lungs CTAB. Gastrointestinal: Soft and nontender. No distention.  No CVA tenderness.  Musculoskeletal: Left ankle: Swelling, bruising and  clear deformity with lateral dislocation, warm and well perfused, normal cap refill Neurologic:  Normal speech and language. No gross focal neurologic deficits are appreciated.  Skin:  Skin is warm, dry Psychiatric: Mood and affect are normal. Speech and behavior are normal.  ____________________________________________   LABS (all labs ordered are listed, but only abnormal results are displayed)  Labs Reviewed  CBC - Abnormal; Notable for the following components:      Result Value   RBC 3.26 (*)    Hemoglobin 10.2 (*)    HCT 32.4 (*)    All other components within normal limits  COMPREHENSIVE METABOLIC PANEL - Abnormal; Notable for the following components:   Glucose, Bld 39 (*)    BUN 38 (*)    Creatinine, Ser 1.72 (*)    Calcium 8.5 (*)    GFR, Estimated 40 (*)    All other components within normal limits  BASIC METABOLIC PANEL  CBC  HEMOGLOBIN A1C  CBG MONITORING, ED  TROPONIN I (HIGH SENSITIVITY)   ____________________________________________  EKG  ED ECG REPORT I, Jene Every, the attending physician, personally viewed and interpreted this ECG.  Date: 12/27/2020  Rhythm: normal sinus rhythm QRS Axis: normal Intervals: Right bundle branch block ST/T Wave abnormalities: normal Narrative Interpretation: no evidence of acute ischemia  ____________________________________________  RADIOLOGY  Ankle x-ray reviewed by me, consistent with malleoli or fracture ____________________________________________   PROCEDURES  Procedure(s) performed: yes   .Ortho Injury Treatment  Date/Time: 12/27/2020 7:40 PM Performed by: Jene Every, MD Authorized by: Jene Every, MD   Consent:    Consent obtained:  Verbal   Consent given by:  Patient   Risks discussed:  Fracture, vascular damage, stiffness, recurrent dislocation, irreducible dislocation, nerve damage and restricted joint movement   Alternatives discussed:  No treatmentInjury location: ankle Location  details: right ankle Injury type: fracture-dislocation Fracture type: trimalleolar Pre-procedure neurovascular assessment: neurovascularly intact Pre-procedure distal perfusion: normal Pre-procedure neurological function: normal Pre-procedure range of motion: normal  Patient sedated: NoManipulation performed: yes Reduction successful: yes X-ray confirmed reduction: yes Immobilization: splint Splint type: sugar tong and short leg Splint Applied by: ED Provider Supplies used: cotton padding,  elastic bandage and Ortho-Glass Post-procedure neurovascular assessment: post-procedure neurovascularly intact Post-procedure distal perfusion: normal Post-procedure neurological function: normal Post-procedure range of motion: normal      Critical Care performed: yes  CRITICAL CARE Performed by: Jene Every   Total critical care time: 30  minutes  Critical care time was exclusive of separately billable procedures and treating other patients.  Critical care was necessary to treat or prevent imminent or life-threatening deterioration.  Critical care was time spent personally by me on the following activities: development of treatment plan with patient and/or surrogate as well as nursing, discussions with consultants, evaluation of  patient's response to treatment, examination of patient, obtaining history from patient or surrogate, ordering and performing treatments and interventions, ordering and review of laboratory studies, ordering and review of radiographic studies, pulse oximetry and re-evaluation of patient's condition.  ____________________________________________   INITIAL IMPRESSION / ASSESSMENT AND PLAN / ED COURSE  Pertinent labs & imaging results that were available during my care of the patient were reviewed by me and considered in my medical decision making (see chart for details).  Patient presents after possible syncopal episode with clear deformity to the left  ankle.  Received IV fentanyl via EMS.  Will obtain EKG, placed in the cardiac monitor, obtain labs including cardiac enzymes and obtain x-rays of the left ankle.  We will scan head and face given his description of fall  X-ray consistent with trimalleolar fracture, also notified by RN that patient's glucose is 39, will give amp of D50, patient is mentating well.  Reduction of trimalleolar fracture performed by me, patient tolerated well  Discussed with Dr. Okey Dupre or of orthopedics  Will admit to the hospitalist service for management of hypoglycemia and evaluation of syncopal episode      ____________________________________________   FINAL CLINICAL IMPRESSION(S) / ED DIAGNOSES  Final diagnoses:  Closed trimalleolar fracture of left ankle, initial encounter  Hypoglycemia  Syncope and collapse        Note:  This document was prepared using Dragon voice recognition software and may include unintentional dictation errors.   Jene Every, MD 12/27/20 2039    Jene Every, MD 12/27/20 2040

## 2020-12-27 NOTE — ED Notes (Signed)
Request made for transport to the floor ?

## 2020-12-28 DIAGNOSIS — S82302A Unspecified fracture of lower end of left tibia, initial encounter for closed fracture: Secondary | ICD-10-CM | POA: Diagnosis not present

## 2020-12-28 DIAGNOSIS — W1839XA Other fall on same level, initial encounter: Secondary | ICD-10-CM | POA: Diagnosis present

## 2020-12-28 DIAGNOSIS — L538 Other specified erythematous conditions: Secondary | ICD-10-CM | POA: Diagnosis present

## 2020-12-28 DIAGNOSIS — F419 Anxiety disorder, unspecified: Secondary | ICD-10-CM | POA: Diagnosis present

## 2020-12-28 DIAGNOSIS — N182 Chronic kidney disease, stage 2 (mild): Secondary | ICD-10-CM | POA: Diagnosis present

## 2020-12-28 DIAGNOSIS — S82852G Displaced trimalleolar fracture of left lower leg, subsequent encounter for closed fracture with delayed healing: Secondary | ICD-10-CM | POA: Diagnosis not present

## 2020-12-28 DIAGNOSIS — E1122 Type 2 diabetes mellitus with diabetic chronic kidney disease: Secondary | ICD-10-CM | POA: Diagnosis present

## 2020-12-28 DIAGNOSIS — F32A Depression, unspecified: Secondary | ICD-10-CM | POA: Diagnosis present

## 2020-12-28 DIAGNOSIS — E119 Type 2 diabetes mellitus without complications: Secondary | ICD-10-CM | POA: Diagnosis not present

## 2020-12-28 DIAGNOSIS — S82892A Other fracture of left lower leg, initial encounter for closed fracture: Secondary | ICD-10-CM | POA: Diagnosis not present

## 2020-12-28 DIAGNOSIS — E782 Mixed hyperlipidemia: Secondary | ICD-10-CM | POA: Diagnosis not present

## 2020-12-28 DIAGNOSIS — R21 Rash and other nonspecific skin eruption: Secondary | ICD-10-CM | POA: Diagnosis not present

## 2020-12-28 DIAGNOSIS — Z20822 Contact with and (suspected) exposure to covid-19: Secondary | ICD-10-CM | POA: Diagnosis present

## 2020-12-28 DIAGNOSIS — Y92008 Other place in unspecified non-institutional (private) residence as the place of occurrence of the external cause: Secondary | ICD-10-CM | POA: Diagnosis not present

## 2020-12-28 DIAGNOSIS — D649 Anemia, unspecified: Secondary | ICD-10-CM | POA: Diagnosis not present

## 2020-12-28 DIAGNOSIS — S82842D Displaced bimalleolar fracture of left lower leg, subsequent encounter for closed fracture with routine healing: Secondary | ICD-10-CM | POA: Diagnosis not present

## 2020-12-28 DIAGNOSIS — N189 Chronic kidney disease, unspecified: Secondary | ICD-10-CM | POA: Diagnosis not present

## 2020-12-28 DIAGNOSIS — G9341 Metabolic encephalopathy: Secondary | ICD-10-CM | POA: Diagnosis present

## 2020-12-28 DIAGNOSIS — I129 Hypertensive chronic kidney disease with stage 1 through stage 4 chronic kidney disease, or unspecified chronic kidney disease: Secondary | ICD-10-CM | POA: Diagnosis present

## 2020-12-28 DIAGNOSIS — E1142 Type 2 diabetes mellitus with diabetic polyneuropathy: Secondary | ICD-10-CM | POA: Diagnosis present

## 2020-12-28 DIAGNOSIS — W19XXXA Unspecified fall, initial encounter: Secondary | ICD-10-CM | POA: Diagnosis not present

## 2020-12-28 DIAGNOSIS — Z79899 Other long term (current) drug therapy: Secondary | ICD-10-CM | POA: Diagnosis not present

## 2020-12-28 DIAGNOSIS — S82892S Other fracture of left lower leg, sequela: Secondary | ICD-10-CM | POA: Diagnosis not present

## 2020-12-28 DIAGNOSIS — E1169 Type 2 diabetes mellitus with other specified complication: Secondary | ICD-10-CM | POA: Diagnosis present

## 2020-12-28 DIAGNOSIS — R531 Weakness: Secondary | ICD-10-CM | POA: Diagnosis not present

## 2020-12-28 DIAGNOSIS — Z9889 Other specified postprocedural states: Secondary | ICD-10-CM | POA: Diagnosis not present

## 2020-12-28 DIAGNOSIS — N179 Acute kidney failure, unspecified: Secondary | ICD-10-CM | POA: Diagnosis present

## 2020-12-28 DIAGNOSIS — E11649 Type 2 diabetes mellitus with hypoglycemia without coma: Secondary | ICD-10-CM

## 2020-12-28 DIAGNOSIS — M25572 Pain in left ankle and joints of left foot: Secondary | ICD-10-CM | POA: Diagnosis not present

## 2020-12-28 DIAGNOSIS — E785 Hyperlipidemia, unspecified: Secondary | ICD-10-CM | POA: Diagnosis present

## 2020-12-28 DIAGNOSIS — S82842A Displaced bimalleolar fracture of left lower leg, initial encounter for closed fracture: Secondary | ICD-10-CM | POA: Diagnosis not present

## 2020-12-28 DIAGNOSIS — Z7982 Long term (current) use of aspirin: Secondary | ICD-10-CM | POA: Diagnosis not present

## 2020-12-28 DIAGNOSIS — E875 Hyperkalemia: Secondary | ICD-10-CM | POA: Diagnosis present

## 2020-12-28 DIAGNOSIS — G8918 Other acute postprocedural pain: Secondary | ICD-10-CM | POA: Diagnosis not present

## 2020-12-28 DIAGNOSIS — S82852A Displaced trimalleolar fracture of left lower leg, initial encounter for closed fracture: Secondary | ICD-10-CM | POA: Diagnosis present

## 2020-12-28 DIAGNOSIS — W5503XA Scratched by cat, initial encounter: Secondary | ICD-10-CM | POA: Diagnosis not present

## 2020-12-28 DIAGNOSIS — S8252XA Displaced fracture of medial malleolus of left tibia, initial encounter for closed fracture: Secondary | ICD-10-CM | POA: Diagnosis not present

## 2020-12-28 DIAGNOSIS — Z7984 Long term (current) use of oral hypoglycemic drugs: Secondary | ICD-10-CM | POA: Diagnosis not present

## 2020-12-28 DIAGNOSIS — Z79890 Hormone replacement therapy (postmenopausal): Secondary | ICD-10-CM | POA: Diagnosis not present

## 2020-12-28 DIAGNOSIS — D509 Iron deficiency anemia, unspecified: Secondary | ICD-10-CM | POA: Diagnosis present

## 2020-12-28 DIAGNOSIS — E039 Hypothyroidism, unspecified: Secondary | ICD-10-CM | POA: Diagnosis present

## 2020-12-28 DIAGNOSIS — R55 Syncope and collapse: Secondary | ICD-10-CM | POA: Diagnosis present

## 2020-12-28 DIAGNOSIS — E162 Hypoglycemia, unspecified: Secondary | ICD-10-CM | POA: Diagnosis not present

## 2020-12-28 LAB — BASIC METABOLIC PANEL
Anion gap: 6 (ref 5–15)
BUN: 40 mg/dL — ABNORMAL HIGH (ref 8–23)
CO2: 23 mmol/L (ref 22–32)
Calcium: 8.6 mg/dL — ABNORMAL LOW (ref 8.9–10.3)
Chloride: 109 mmol/L (ref 98–111)
Creatinine, Ser: 1.74 mg/dL — ABNORMAL HIGH (ref 0.61–1.24)
GFR, Estimated: 39 mL/min — ABNORMAL LOW (ref >60)
Glucose, Bld: 99 mg/dL (ref 70–99)
Potassium: 5.2 mmol/L — ABNORMAL HIGH (ref 3.5–5.1)
Sodium: 138 mmol/L (ref 135–145)

## 2020-12-28 LAB — CBC
HCT: 29.2 % — ABNORMAL LOW (ref 39.0–52.0)
Hemoglobin: 9.4 g/dL — ABNORMAL LOW (ref 13.0–17.0)
MCH: 31.6 pg (ref 26.0–34.0)
MCHC: 32.2 g/dL (ref 30.0–36.0)
MCV: 98.3 fL (ref 80.0–100.0)
Platelets: 210 10*3/uL (ref 150–400)
RBC: 2.97 MIL/uL — ABNORMAL LOW (ref 4.22–5.81)
RDW: 13.8 % (ref 11.5–15.5)
WBC: 7.6 10*3/uL (ref 4.0–10.5)
nRBC: 0 % (ref 0.0–0.2)

## 2020-12-28 LAB — GLUCOSE, CAPILLARY
Glucose-Capillary: 100 mg/dL — ABNORMAL HIGH (ref 70–99)
Glucose-Capillary: 118 mg/dL — ABNORMAL HIGH (ref 70–99)
Glucose-Capillary: 205 mg/dL — ABNORMAL HIGH (ref 70–99)
Glucose-Capillary: 199 mg/dL — ABNORMAL HIGH (ref 70–99)

## 2020-12-28 LAB — SARS CORONAVIRUS 2 (TAT 6-24 HRS): SARS Coronavirus 2: NEGATIVE

## 2020-12-28 MED ORDER — ENSURE ENLIVE PO LIQD
237.0000 mL | Freq: Two times a day (BID) | ORAL | Status: DC
  Administered 2020-12-30 – 2021-01-10 (×12): 237 mL via ORAL

## 2020-12-28 MED ORDER — ONDANSETRON HCL 4 MG/2ML IJ SOLN
4.0000 mg | Freq: Four times a day (QID) | INTRAMUSCULAR | Status: DC | PRN
  Administered 2020-12-28 – 2021-01-08 (×2): 4 mg via INTRAVENOUS
  Filled 2020-12-28 (×2): qty 2

## 2020-12-28 MED ORDER — CEFAZOLIN (ANCEF) 1 G IV SOLR
2.0000 g | INTRAVENOUS | Status: AC
  Administered 2020-12-29: 2 g
  Filled 2020-12-28: qty 2

## 2020-12-28 MED ORDER — MORPHINE SULFATE (PF) 2 MG/ML IV SOLN
1.0000 mg | INTRAVENOUS | Status: DC | PRN
  Administered 2020-12-28: 1 mg via INTRAVENOUS
  Filled 2020-12-28: qty 1

## 2020-12-28 MED ORDER — OXYCODONE-ACETAMINOPHEN 5-325 MG PO TABS
1.0000 | ORAL_TABLET | ORAL | Status: DC | PRN
  Administered 2020-12-28: 2 via ORAL
  Administered 2020-12-29 – 2020-12-30 (×2): 1 via ORAL
  Filled 2020-12-28: qty 2
  Filled 2020-12-28: qty 1
  Filled 2020-12-28: qty 2
  Filled 2020-12-28: qty 1

## 2020-12-28 MED ORDER — MORPHINE SULFATE (PF) 2 MG/ML IV SOLN
1.0000 mg | INTRAVENOUS | Status: DC | PRN
  Administered 2020-12-29 – 2021-01-10 (×11): 2 mg via INTRAVENOUS
  Filled 2020-12-28 (×11): qty 1

## 2020-12-28 MED ORDER — MORPHINE SULFATE (PF) 2 MG/ML IV SOLN
1.0000 mg | INTRAVENOUS | Status: DC | PRN
  Administered 2020-12-28: 2 mg via INTRAVENOUS
  Filled 2020-12-28: qty 1

## 2020-12-28 NOTE — Consult Note (Signed)
ORTHOPAEDIC CONSULTATION  REQUESTING PHYSICIAN: Azucena Fallen, MD  Chief Complaint: Left bimalleolar ankle fracture  HPI: Brad Santiago is a 80 y.o. male who complains of right ankle pain after a syncopal episode at home.  Patient is currently admitted to the hospitalist service for work-up of his syncope.  He was reduced and splinted in the ER.  He lives at home with 2 cats.  He uses a rolling walker in the home and a cane outside the home. Orthopedics was consulted regarding potential operative management.  History reviewed. No pertinent past medical history. History reviewed. No pertinent surgical history. Social History   Socioeconomic History  . Marital status: Married    Spouse name: Not on file  . Number of children: Not on file  . Years of education: Not on file  . Highest education level: Not on file  Occupational History  . Not on file  Tobacco Use  . Smoking status: Not on file  . Smokeless tobacco: Not on file  Substance and Sexual Activity  . Alcohol use: Not on file  . Drug use: Not on file  . Sexual activity: Not on file  Other Topics Concern  . Not on file  Social History Narrative  . Not on file   Social Determinants of Health   Financial Resource Strain: Not on file  Food Insecurity: Not on file  Transportation Needs: Not on file  Physical Activity: Not on file  Stress: Not on file  Social Connections: Not on file   History reviewed. No pertinent family history. Allergies  Allergen Reactions  . Sulfa Antibiotics    Prior to Admission medications   Medication Sig Start Date End Date Taking? Authorizing Provider  amLODipine (NORVASC) 5 MG tablet Take 5 mg by mouth daily.    [provider]  aspirin EC 81 MG tablet Take 81 mg by mouth daily.    [provider]  atorvastatin (LIPITOR) 80 MG tablet Take 80 mg by mouth daily at 6 PM.    [provider]  escitalopram (LEXAPRO) 20 MG tablet Take 20 mg by mouth daily.     [provider]  furosemide (LASIX) 20 MG tablet Take 20 mg by mouth daily.    [provider]  gabapentin (NEURONTIN) 300 MG capsule Take 300 mg by mouth at bedtime. Patient states he takes differently.  States he takes 2 at night time.    [provider]  glipiZIDE-metformin (METAGLIP) 5-500 MG tablet Take 2 tablets by mouth 2 (two) times daily before a meal.    [provider]  levothyroxine (SYNTHROID, LEVOTHROID) 25 MCG tablet Take 25 mcg by mouth daily before breakfast.    [provider]  magnesium oxide (MAG-OX) 400 MG tablet Take 400 mg by mouth daily.    [provider]  oxymetazoline (AFRIN) 0.05 % nasal spray Place 1 spray into both nostrils. Patient states uses 1 time per day    [provider]  pioglitazone (ACTOS) 30 MG tablet Take 30 mg by mouth daily.    [provider]  polyethylene glycol (MIRALAX / GLYCOLAX) packet Take 17 g by mouth daily as needed.    [provider]   DG Ankle 2 Views Left  Result Date: 12/27/2020 CLINICAL DATA:  Postreduction left ankle fractures. EXAM: LEFT ANKLE - 2 VIEW COMPARISON:  12/27/2020 FINDINGS: Interval placement of cast material which obscures some bone detail. Comminuted fractures are demonstrated in the medial, lateral, and posterior malleolus. Improved alignment  of the fracture fragments postreduction but there is still about 6 mm lateral displacement of the talus and medial malleolar fragment with respect to the tibia, resulting in a gap in the articular surface. Degenerative changes in the intertarsal joints. IMPRESSION: Comminuted fractures of the medial, lateral, and posterior malleolus with improved alignment postreduction but persistent lateral displacement of the talus and medial malleolar fragment with respect to the tibia. Electronically Signed   By: Burman Nieves M.D.   On: 12/27/2020 20:05   DG Ankle Complete Left  Result Date: 12/27/2020 CLINICAL  DATA:  Ankle pain and swelling after fall EXAM: LEFT ANKLE COMPLETE - 3+ VIEW COMPARISON:  None. FINDINGS: Displaced fracture of the medial malleolus with apparent preservation of the medial clear space. Minimally displaced fracture of the posterior tibial plafond. Displaced fracture of the distal fibula with apparent preservation of the lateral clear space. Diffuse soft tissue swelling about the ankle. IMPRESSION: Displaced trimalleolar fracture with diffuse soft tissue swelling about the ankle. Electronically Signed   By: Maudry Mayhew MD   On: 12/27/2020 18:30   CT Head Wo Contrast  Result Date: 12/27/2020 CLINICAL DATA:  Fall EXAM: CT HEAD WITHOUT CONTRAST CT MAXILLOFACIAL WITHOUT CONTRAST TECHNIQUE: Multidetector CT imaging of the head and maxillofacial structures were performed using the standard protocol without intravenous contrast. Multiplanar CT image reconstructions of the maxillofacial structures were also generated. COMPARISON:  None. FINDINGS: CT HEAD FINDINGS Brain: There is no mass, hemorrhage or extra-axial collection. There is generalized atrophy without lobar predilection. There is hypoattenuation of the periventricular white matter, most commonly indicating chronic ischemic microangiopathy. Vascular: No hyperdense vessel or unexpected vascular calcification. Skull: The visualized skull base, calvarium and extracranial soft tissues are normal. CT MAXILLOFACIAL FINDINGS Osseous: --Complex facial fracture types: No LeFort, zygomaticomaxillary complex or nasoorbitoethmoidal fracture. --Simple fracture types: None. --Mandible, hard palate and teeth: No acute abnormality. Two areas of cystic change in the maxilla at the roots of absent incisors. Orbits: The globes and optic nerves are intact. Normal extraocular muscles and intraorbital fat. Sinuses: No acute finding. Soft tissues: Normal visualized extracranial soft tissues. IMPRESSION: 1. Chronic ischemic microangiopathy and generalized atrophy  without acute intracranial abnormality. 2. No facial fracture. Electronically Signed   By: Deatra Robinson M.D.   On: 12/27/2020 19:13   CT Maxillofacial Wo Contrast  Result Date: 12/27/2020 CLINICAL DATA:  Fall EXAM: CT HEAD WITHOUT CONTRAST CT MAXILLOFACIAL WITHOUT CONTRAST TECHNIQUE: Multidetector CT imaging of the head and maxillofacial structures were performed using the standard protocol without intravenous contrast. Multiplanar CT image reconstructions of the maxillofacial structures were also generated. COMPARISON:  None. FINDINGS: CT HEAD FINDINGS Brain: There is no mass, hemorrhage or extra-axial collection. There is generalized atrophy without lobar predilection. There is hypoattenuation of the periventricular white matter, most commonly indicating chronic ischemic microangiopathy. Vascular: No hyperdense vessel or unexpected vascular calcification. Skull: The visualized skull base, calvarium and extracranial soft tissues are normal. CT MAXILLOFACIAL FINDINGS Osseous: --Complex facial fracture types: No LeFort, zygomaticomaxillary complex or nasoorbitoethmoidal fracture. --Simple fracture types: None. --Mandible, hard palate and teeth: No acute abnormality. Two areas of cystic change in the maxilla at the roots of absent incisors. Orbits: The globes and optic nerves are intact. Normal extraocular muscles and intraorbital fat. Sinuses: No acute finding. Soft tissues: Normal visualized extracranial soft tissues. IMPRESSION: 1. Chronic ischemic microangiopathy and generalized atrophy without acute intracranial abnormality. 2. No facial fracture. Electronically Signed   By: Deatra Robinson M.D.   On: 12/27/2020 19:13  Positive ROS: All other systems have been reviewed and were otherwise negative with the exception of those mentioned in the HPI and as above.  Physical Exam: General: Alert, no acute distress Cardiovascular: No pedal edema Respiratory: No cyanosis, no use of accessory musculature GI:  No organomegaly, abdomen is soft and non-tender Skin: No lesions in the area of chief complaint Neurologic: Sensation intact distally Psychiatric: Patient is competent for consent with normal mood and affect Lymphatic: No axillary or cervical lymphadenopathy  MUSCULOSKELETAL: Use scratches of the left lower extremity above the splint, splint is clean and intact, patient endorses sensation throughout the foot, grossly neurovascular intact  Assessment: 80 year old male who sustained a displaced bimalleolar left ankle fracture after syncopal episode at home.  Plan: -Patient has a displaced ankle fracture which will require surgery/ORIF depending on soft tissue swelling -Appreciate hospitalist team support and work-up of syncope -PT/OT: Nonweightbearing left lower extremity.  Please continue with elevation of the left foot to keep it above the level of the heart to minimize swelling -We will continue to follow    Ross Marcus, MD   12/28/2020 8:08 AM

## 2020-12-28 NOTE — Anesthesia Preprocedure Evaluation (Addendum)
Anesthesia Evaluation  Patient identified by MRN, date of birth, ID band Patient awake    Reviewed: Allergy & Precautions, NPO status , Patient's Chart, lab work & pertinent test results  History of Anesthesia Complications Negative for: history of anesthetic complications  Airway Mallampati: II  TM Distance: >3 FB Neck ROM: Full    Dental  (+) Edentulous Upper, Edentulous Lower   Pulmonary neg pulmonary ROS, neg sleep apnea, neg COPD,    breath sounds clear to auscultation- rhonchi (-) wheezing      Cardiovascular hypertension, Pt. on medications (-) CAD, (-) Past MI, (-) Cardiac Stents and (-) CABG  Rhythm:Regular Rate:Normal - Systolic murmurs and - Diastolic murmurs    Neuro/Psych neg Seizures negative neurological ROS  negative psych ROS   GI/Hepatic negative GI ROS, Neg liver ROS,   Endo/Other  diabetes, Oral Hypoglycemic AgentsHypothyroidism   Renal/GU Renal InsufficiencyRenal disease     Musculoskeletal negative musculoskeletal ROS (+)   Abdominal (+) + obese,   Peds  Hematology negative hematology ROS (+)   Anesthesia Other Findings Past Medical History: No date: Diabetes (HCC) No date: Hyperlipemia No date: Hypertension   Reproductive/Obstetrics                           Anesthesia Physical Anesthesia Plan  ASA: III  Anesthesia Plan: General   Post-op Pain Management:  Regional for Post-op pain   Induction: Intravenous  PONV Risk Score and Plan: 1 and Ondansetron  Airway Management Planned: Oral ETT  Additional Equipment:   Intra-op Plan:   Post-operative Plan: Extubation in OR  Informed Consent: I have reviewed the patients History and Physical, chart, labs and discussed the procedure including the risks, benefits and alternatives for the proposed anesthesia with the patient or authorized representative who has indicated his/her understanding and acceptance.      Dental advisory given  Plan Discussed with: CRNA and Anesthesiologist  Anesthesia Plan Comments:         Anesthesia Quick Evaluation

## 2020-12-28 NOTE — Progress Notes (Signed)
Initial Nutrition Assessment  DOCUMENTATION CODES:  Obesity unspecified  INTERVENTION:   Continue current diet as ordered  Ensure Enlive po BID, each supplement provides 350 kcal and 20 grams of protein  NUTRITION DIAGNOSIS:  Increased nutrient needs related to post-op healing as evidenced by estimated needs.  GOAL:  Patient will meet greater than or equal to 90% of their needs  MONITOR:  PO intake,Supplement acceptance  REASON FOR ASSESSMENT:  Consult Hip fracture protocol  ASSESSMENT:  Pt presented to ED after passing out at home. Found to be hypoglycemic in ED and imaging suggestive of an ankle fracture. At baseline, lives at home with his 2 cats and uses a walker to ambulate. PMH includes HTN, DM type 2, hypothyroid, HLD.  Pt resting in bed at the time of visit. No meals recorded this admission but pt reports that he is not having surgery today and he was able to eat lunch. Pt reports good intake of lunch but states he does not have a good appetite. Pt reports eating two meals a day at home but does snack in between on items such as chips and salsa. Lives at home alone and states he does his own shopping and meal preparation. Looking forward to dinner and hopefully he will be able to have surgery tomorrow. Discussed increased nutrition needs after surgery, agreeable to a nutrition supplement.   Nutritionally Relevant Scheduled Meds: . [START ON 12/29/2020] atorvastatin  80 mg Oral q1800  . insulin aspart  0-5 Units Subcutaneous QHS  . insulin aspart  0-9 Units Subcutaneous TID WC   Nutritionally Relevant Continuous Infusions: . sodium chloride 125 mL/hr at 12/28/20 0305   Labs reviewed:  K - 5.2  BUN 40, creatinine 1.74  SBG ranges from 39-99 mg/dL over the last 24 hours  Last HgbA1c 6.1 (4/24)  NUTRITION - FOCUSED PHYSICAL EXAM: Flowsheet Row Most Recent Value  Orbital Region No depletion  Upper Arm Region No depletion  Thoracic and Lumbar Region No depletion   Buccal Region No depletion  Temple Region No depletion  Clavicle Bone Region No depletion  Clavicle and Acromion Bone Region No depletion  Scapular Bone Region No depletion  Dorsal Hand Mild depletion  Patellar Region No depletion  Anterior Thigh Region No depletion  Posterior Calf Region No depletion  Edema (RD Assessment) Mild  Hair Reviewed  Eyes Reviewed  Mouth Reviewed  Skin Reviewed  Nails Reviewed     Diet Order:   Diet Order            Diet regular Room service appropriate? Yes; Fluid consistency: Thin  Diet effective now                EDUCATION NEEDS:  No education needs have been identified at this time  Skin:  Skin Assessment: Skin Integrity Issues: Skin Integrity Issues:: Other (Comment) Other: abraisions to the right and left legs  Last BM:  4/24 per RN documentation, type 4  Height:  Ht Readings from Last 1 Encounters:  12/27/20 6' (1.829 m)    Weight:  Wt Readings from Last 1 Encounters:  12/27/20 104.3 kg    Ideal Body Weight:  80.9 kg  BMI:  Body mass index is 31.19 kg/m.  Estimated Nutritional Needs:   Kcal:  1900-2100 kcal/d  Protein:  95-105 g  Fluid:  >2L/d   Greig Castilla, RD, LDN Clinical Dietitian Pager on Amion

## 2020-12-28 NOTE — Progress Notes (Signed)
PROGRESS NOTE    Brad Santiago  WER:[id number] DOB: [birth year] DOA: 12/27/2020 PCP: Dorothey Baseman, MD   Brief Narrative:  Brad Santiago is a 80 y.o. male with medical history significant for hypertension, non-insulin-dependent diabetes mellitus, hypothyroid, hyperlipidemia, peripheral neuropathy, presents to the emergency department for chief concerns of loss of consciousness. Patient states he was sitting down on his chair when he felt weak in the last thing he remembered was seen the coffee table about to catch his chin as he fell forward. He woke up on the floor.  He states he does not remember anything else.  His son at bedside states that when his father came to and his father called him. Brad Santiago reports that he has not had his medications on day of presentation.  He states that the last time he took his medications was yesterday at 3:30 PM, and one of his DM medications and gabapentin at night at approximately midnight.  Imaging in the ED shows left ankle fracture, hospitalist called for admit.  Orthopedic surgery consulted for possible procedure.  Assessment & Plan:   Active Problems:   Closed left ankle fracture   Diabetes mellitus type 2, noninsulin dependent (HCC)   Essential hypertension   Hyperlipidemia   Acquired hypothyroidism   AKI (acute kidney injury) (HCC)   Acute metabolic encephalopathy versus syncope- likely secondary to hypoglycemia Rule out polypharmacy although less likely - Improved with support - now AOx4 - Dextrose 50 IV as needed for low blood sugar ordered -Continue to hold CNS depressing medications as possible  Closed left ankle fracture-present on admission secondary to syncope - Comminuted fractures of the medial, lateral, and posterior malleolus on imaging  -  Dr. Crawford/orthopedics aware, appreciate insight and recommendations -potential surgery later today pending swelling reduction - Pain control with acetaminophen 650 mg p.o. every 6 hours as  needed for mild pain, fever, headache, ketorolac 50 mg IV every 6 hours as needed for moderate pain, morphine 0.5 mg IV every 2 hours as needed for severe pain ordered  AKI without history of CKD - Serum creatinine on presentation was 1.72, so this may be chronic changes versus acute kidney injury -  Decrease normal saline to 70cc/hr perioperatively -  Follow repeat labs  Non-insulin-dependent diabetes mellitus, uncontrolled with hypoglycemia as above - Holding home glipizide-metformin-Actos Lab Results  Component Value Date   HGBA1C 6.1 (H) 12/27/2020  - Recommend on discharge to discontinue glipizide and Actos given profound hypoglycemia -although would expect A1c to be lower if this was a chronic hypoglycemic issue, this could be dietary.  Close follow-up with PCP or endocrinology at discharge. - Insulin sliding scale with at bedtime coverage ordered - Dextrose 50 as needed for hypoglycemia  Hyperlipidemia-atorvastatin 80 mg nightly resumed  Hypertension - Controlled despite patient not taking any antihypertensives today - Amlodipine 5 mg p.o. resumed for 12/28/2020 - Hydralazine 25 mg p.o. every 8 hours as needed for SBP greater than 175  Depression/anxiety - Escitalopram 20 mg daily resumed  DVT prophylaxis: TED hose to the right lower extremity Code Status: Full code Family Communication: None present  Status is: Inpatient  Dispo: The patient is from: Home              Anticipated d/c is to: To be determined              Anticipated d/c date is: 48 to 72 hours              Patient  currently not medically stable for discharge given ongoing need for intervention, surgery, close monitoring in setting of profound hypoglycemia  Consultants:   Orthopedic Surgery  Procedures:   Pending ortho evaluation  Antimicrobials:  None indicated  Subjective: No acute issues or events overnight, pain currently well controlled on regimen denies nausea vomiting diarrhea  constipation headache fevers or chills.  Objective: Vitals:   12/27/20 1900 12/27/20 1940 12/27/20 2115 12/27/20 2236  BP: 111/68 128/86 125/73 128/71  Pulse: 60 65 73 71  Resp: 19 18 18 20   Temp:    98.1 F (36.7 C)  TempSrc:    Oral  SpO2: 99% 100% 98% 100%  Weight:      Height:        Intake/Output Summary (Last 24 hours) at 12/28/2020 0703 Last data filed at 12/28/2020 0305 Gross per 24 hour  Intake 439.38 ml  Output --  Net 439.38 ml   Filed Weights   12/27/20 1751  Weight: 104.3 kg    Examination:  General:  Pleasantly resting in bed, No acute distress. HEENT:  Normocephalic atraumatic.  Sclerae nonicteric, noninjected.  Extraocular movements intact bilaterally. Neck:  Without mass or deformity.  Trachea is midline. Lungs:  Clear to auscultate bilaterally without rhonchi, wheeze, or rales. Heart:  Regular rate and rhythm.  Without murmurs, rubs, or gallops. Abdomen:  Soft, nontender, nondistended.  Without guarding or rebound. Extremities: Without cyanosis, clubbing, left foot splint/bandage clean dry intact Vascular:  Dorsalis pedis and posterior tibial pulses palpable bilaterally. Skin:  Warm and dry, no erythema, no ulcerations.   Data Reviewed: I have personally reviewed following labs and imaging studies  CBC: Recent Labs  Lab 12/27/20 1759 12/28/20 0348  WBC 6.5 7.6  HGB 10.2* 9.4*  HCT 32.4* 29.2*  MCV 99.4 98.3  PLT 246 210   Basic Metabolic Panel: Recent Labs  Lab 12/27/20 1759 12/28/20 0348  NA 137 138  K 4.8 5.2*  CL 108 109  CO2 24 23  GLUCOSE 39* 99  BUN 38* 40*  CREATININE 1.72* 1.74*  CALCIUM 8.5* 8.6*   GFR: Estimated Creatinine Clearance: 42.3 mL/min (A) (by C-G formula based on SCr of 1.74 mg/dL (H)). Liver Function Tests: Recent Labs  Lab 12/27/20 1759  AST 24  ALT 12  ALKPHOS 78  BILITOT 0.6  PROT 6.8  ALBUMIN 3.7   No results for input(s): LIPASE, AMYLASE in the last 168 hours. No results for input(s): AMMONIA  in the last 168 hours. Coagulation Profile: No results for input(s): INR, PROTIME in the last 168 hours. Cardiac Enzymes: No results for input(s): CKTOTAL, CKMB, CKMBINDEX, TROPONINI in the last 168 hours. BNP (last 3 results) No results for input(s): PROBNP in the last 8760 hours. HbA1C: Recent Labs    12/27/20 2020  HGBA1C 6.1*   CBG: Recent Labs  Lab 12/27/20 1941 12/27/20 2313  GLUCAP 75 121*   Lipid Profile: No results for input(s): CHOL, HDL, LDLCALC, TRIG, CHOLHDL, LDLDIRECT in the last 72 hours. Thyroid Function Tests: Recent Labs    12/27/20 1759  TSH 1.614   Anemia Panel: No results for input(s): VITAMINB12, FOLATE, FERRITIN, TIBC, IRON, RETICCTPCT in the last 72 hours. Sepsis Labs: No results for input(s): PROCALCITON, LATICACIDVEN in the last 168 hours.  No results found for this or any previous visit (from the past 240 hour(s)).       Radiology Studies: DG Ankle 2 Views Left  Result Date: 12/27/2020 CLINICAL DATA:  Postreduction left ankle fractures. EXAM: LEFT ANKLE -  2 VIEW COMPARISON:  12/27/2020 FINDINGS: Interval placement of cast material which obscures some bone detail. Comminuted fractures are demonstrated in the medial, lateral, and posterior malleolus. Improved alignment of the fracture fragments postreduction but there is still about 6 mm lateral displacement of the talus and medial malleolar fragment with respect to the tibia, resulting in a gap in the articular surface. Degenerative changes in the intertarsal joints. IMPRESSION: Comminuted fractures of the medial, lateral, and posterior malleolus with improved alignment postreduction but persistent lateral displacement of the talus and medial malleolar fragment with respect to the tibia. Electronically Signed   By: Burman Nieves M.D.   On: 12/27/2020 20:05   DG Ankle Complete Left  Result Date: 12/27/2020 CLINICAL DATA:  Ankle pain and swelling after fall EXAM: LEFT ANKLE COMPLETE - 3+ VIEW  COMPARISON:  None. FINDINGS: Displaced fracture of the medial malleolus with apparent preservation of the medial clear space. Minimally displaced fracture of the posterior tibial plafond. Displaced fracture of the distal fibula with apparent preservation of the lateral clear space. Diffuse soft tissue swelling about the ankle. IMPRESSION: Displaced trimalleolar fracture with diffuse soft tissue swelling about the ankle. Electronically Signed   By: Maudry Mayhew MD   On: 12/27/2020 18:30   CT Head Wo Contrast  Result Date: 12/27/2020 CLINICAL DATA:  Fall EXAM: CT HEAD WITHOUT CONTRAST CT MAXILLOFACIAL WITHOUT CONTRAST TECHNIQUE: Multidetector CT imaging of the head and maxillofacial structures were performed using the standard protocol without intravenous contrast. Multiplanar CT image reconstructions of the maxillofacial structures were also generated. COMPARISON:  None. FINDINGS: CT HEAD FINDINGS Brain: There is no mass, hemorrhage or extra-axial collection. There is generalized atrophy without lobar predilection. There is hypoattenuation of the periventricular white matter, most commonly indicating chronic ischemic microangiopathy. Vascular: No hyperdense vessel or unexpected vascular calcification. Skull: The visualized skull base, calvarium and extracranial soft tissues are normal. CT MAXILLOFACIAL FINDINGS Osseous: --Complex facial fracture types: No LeFort, zygomaticomaxillary complex or nasoorbitoethmoidal fracture. --Simple fracture types: None. --Mandible, hard palate and teeth: No acute abnormality. Two areas of cystic change in the maxilla at the roots of absent incisors. Orbits: The globes and optic nerves are intact. Normal extraocular muscles and intraorbital fat. Sinuses: No acute finding. Soft tissues: Normal visualized extracranial soft tissues. IMPRESSION: 1. Chronic ischemic microangiopathy and generalized atrophy without acute intracranial abnormality. 2. No facial fracture. Electronically  Signed   By: Deatra Robinson M.D.   On: 12/27/2020 19:13   CT Maxillofacial Wo Contrast  Result Date: 12/27/2020 CLINICAL DATA:  Fall EXAM: CT HEAD WITHOUT CONTRAST CT MAXILLOFACIAL WITHOUT CONTRAST TECHNIQUE: Multidetector CT imaging of the head and maxillofacial structures were performed using the standard protocol without intravenous contrast. Multiplanar CT image reconstructions of the maxillofacial structures were also generated. COMPARISON:  None. FINDINGS: CT HEAD FINDINGS Brain: There is no mass, hemorrhage or extra-axial collection. There is generalized atrophy without lobar predilection. There is hypoattenuation of the periventricular white matter, most commonly indicating chronic ischemic microangiopathy. Vascular: No hyperdense vessel or unexpected vascular calcification. Skull: The visualized skull base, calvarium and extracranial soft tissues are normal. CT MAXILLOFACIAL FINDINGS Osseous: --Complex facial fracture types: No LeFort, zygomaticomaxillary complex or nasoorbitoethmoidal fracture. --Simple fracture types: None. --Mandible, hard palate and teeth: No acute abnormality. Two areas of cystic change in the maxilla at the roots of absent incisors. Orbits: The globes and optic nerves are intact. Normal extraocular muscles and intraorbital fat. Sinuses: No acute finding. Soft tissues: Normal visualized extracranial soft tissues. IMPRESSION: 1. Chronic ischemic  microangiopathy and generalized atrophy without acute intracranial abnormality. 2. No facial fracture. Electronically Signed   By: Deatra Robinson M.D.   On: 12/27/2020 19:13   Scheduled Meds: . amLODipine  5 mg Oral Daily  . [START ON 12/29/2020] atorvastatin  80 mg Oral q1800  . escitalopram  20 mg Oral Daily  . insulin aspart  0-5 Units Subcutaneous QHS  . insulin aspart  0-9 Units Subcutaneous TID WC  . levothyroxine  25 mcg Oral Q0600   Continuous Infusions: . sodium chloride 125 mL/hr at 12/28/20 0305     LOS: 0 days   Time  spent:  Azucena Fallen, DO [facility]  If 7PM-7AM, please contact night-coverage [email or web address]  12/28/2020, 7:03 AM

## 2020-12-28 NOTE — Plan of Care (Signed)

## 2020-12-29 ENCOUNTER — Inpatient Hospital Stay: Payer: Medicare HMO | Admitting: Anesthesiology

## 2020-12-29 ENCOUNTER — Inpatient Hospital Stay: Payer: Medicare HMO

## 2020-12-29 ENCOUNTER — Encounter
Admission: EM | Disposition: A | Discharge status: Skilled Nursing Facility | Payer: Self-pay | Source: Home / Self Care | Attending: Internal Medicine

## 2020-12-29 ENCOUNTER — Encounter: Payer: Self-pay | Admitting: Internal Medicine

## 2020-12-29 DIAGNOSIS — S82892A Other fracture of left lower leg, initial encounter for closed fracture: Secondary | ICD-10-CM | POA: Diagnosis not present

## 2020-12-29 DIAGNOSIS — E11649 Type 2 diabetes mellitus with hypoglycemia without coma: Secondary | ICD-10-CM | POA: Diagnosis not present

## 2020-12-29 DIAGNOSIS — E119 Type 2 diabetes mellitus without complications: Secondary | ICD-10-CM | POA: Diagnosis not present

## 2020-12-29 LAB — CBC
HCT: 27.5 % — ABNORMAL LOW (ref 39.0–52.0)
Hemoglobin: 9 g/dL — ABNORMAL LOW (ref 13.0–17.0)
MCH: 32.5 pg (ref 26.0–34.0)
MCHC: 32.7 g/dL (ref 30.0–36.0)
MCV: 99.3 fL (ref 80.0–100.0)
Platelets: 194 10*3/uL (ref 150–400)
RBC: 2.77 MIL/uL — ABNORMAL LOW (ref 4.22–5.81)
RDW: 14.2 % (ref 11.5–15.5)
WBC: 8.4 10*3/uL (ref 4.0–10.5)
nRBC: 0 % (ref 0.0–0.2)

## 2020-12-29 LAB — BASIC METABOLIC PANEL
Anion gap: 6 (ref 5–15)
BUN: 41 mg/dL — ABNORMAL HIGH (ref 8–23)
CO2: 22 mmol/L (ref 22–32)
Calcium: 8.2 mg/dL — ABNORMAL LOW (ref 8.9–10.3)
Chloride: 110 mmol/L (ref 98–111)
Creatinine, Ser: 1.67 mg/dL — ABNORMAL HIGH (ref 0.61–1.24)
GFR, Estimated: 41 mL/min — ABNORMAL LOW (ref >60)
Glucose, Bld: 168 mg/dL — ABNORMAL HIGH (ref 70–99)
Potassium: 5.3 mmol/L — ABNORMAL HIGH (ref 3.5–5.1)
Sodium: 138 mmol/L (ref 135–145)

## 2020-12-29 LAB — GLUCOSE, CAPILLARY
Glucose-Capillary: 158 mg/dL — ABNORMAL HIGH (ref 70–99)
Glucose-Capillary: 127 mg/dL — ABNORMAL HIGH (ref 70–99)
Glucose-Capillary: 117 mg/dL — ABNORMAL HIGH (ref 70–99)
Glucose-Capillary: 166 mg/dL — ABNORMAL HIGH (ref 70–99)
Glucose-Capillary: 261 mg/dL — ABNORMAL HIGH (ref 70–99)

## 2020-12-29 SURGERY — EXTERNAL FIXATION, ANKLE
Anesthesia: General | Site: Ankle | Laterality: Left

## 2020-12-29 MED ORDER — DEXAMETHASONE SODIUM PHOSPHATE 10 MG/ML IJ SOLN
INTRAMUSCULAR | Status: DC | PRN
  Administered 2020-12-29: 10 mg via INTRAVENOUS

## 2020-12-29 MED ORDER — MEPERIDINE HCL 50 MG/ML IJ SOLN: 6.2500 mg | INTRAMUSCULAR | Status: DC | PRN

## 2020-12-29 MED ORDER — OXYCODONE HCL 5 MG/5ML PO SOLN: 5.0000 mg | Freq: Once | ORAL | Status: DC | PRN

## 2020-12-29 MED ORDER — SODIUM ZIRCONIUM CYCLOSILICATE 5 G PO PACK
5.0000 g | PACK | Freq: Once | ORAL | Status: AC
  Administered 2020-12-29: 5 g via ORAL
  Filled 2020-12-29: qty 1

## 2020-12-29 MED ORDER — SUGAMMADEX SODIUM 200 MG/2ML IV SOLN
INTRAVENOUS | Status: DC | PRN
  Administered 2020-12-29: 200 mg via INTRAVENOUS

## 2020-12-29 MED ORDER — LIDOCAINE HCL (PF) 1 % IJ SOLN
INTRAMUSCULAR | Status: AC
  Filled 2020-12-29: qty 5

## 2020-12-29 MED ORDER — LIDOCAINE HCL (PF) 1 % IJ SOLN
INTRAMUSCULAR | Status: DC | PRN
  Administered 2020-12-29: 5 mL

## 2020-12-29 MED ORDER — PROPOFOL 10 MG/ML IV BOLUS
INTRAVENOUS | Status: AC
  Filled 2020-12-29: qty 20

## 2020-12-29 MED ORDER — ROCURONIUM BROMIDE 100 MG/10ML IV SOLN
INTRAVENOUS | Status: DC | PRN
  Administered 2020-12-29: 50 mg via INTRAVENOUS

## 2020-12-29 MED ORDER — SODIUM CHLORIDE 0.9 % IV SOLN
1.0000 g | Freq: Three times a day (TID) | INTRAVENOUS | Status: AC
  Administered 2020-12-29 – 2020-12-30 (×3): 1 g via INTRAVENOUS
  Filled 2020-12-29 (×3): qty 10

## 2020-12-29 MED ORDER — SODIUM CHLORIDE 0.9 % IV SOLN
INTRAVENOUS | Status: DC | PRN
  Administered 2020-12-29: 40 ug/min via INTRAVENOUS

## 2020-12-29 MED ORDER — PROPOFOL 10 MG/ML IV BOLUS
INTRAVENOUS | Status: DC | PRN
  Administered 2020-12-29: 20 mg via INTRAVENOUS

## 2020-12-29 MED ORDER — ATORVASTATIN CALCIUM 20 MG PO TABS
80.0000 mg | ORAL_TABLET | Freq: Every day | ORAL | Status: DC
  Administered 2020-12-29 – 2021-01-10 (×13): 80 mg via ORAL
  Filled 2020-12-29 (×13): qty 4

## 2020-12-29 MED ORDER — FENTANYL CITRATE (PF) 100 MCG/2ML IJ SOLN
INTRAMUSCULAR | Status: DC | PRN
  Administered 2020-12-29 (×2): 50 ug via INTRAVENOUS

## 2020-12-29 MED ORDER — MUPIROCIN 2 % EX OINT
1.0000 "application " | TOPICAL_OINTMENT | Freq: Two times a day (BID) | CUTANEOUS | Status: DC
  Administered 2020-12-29: 1 via NASAL
  Filled 2020-12-29: qty 22

## 2020-12-29 MED ORDER — FENTANYL CITRATE (PF) 100 MCG/2ML IJ SOLN: 25.0000 ug | INTRAMUSCULAR | Status: DC | PRN

## 2020-12-29 MED ORDER — LIDOCAINE HCL (CARDIAC) PF 100 MG/5ML IV SOSY
PREFILLED_SYRINGE | INTRAVENOUS | Status: DC | PRN
  Administered 2020-12-29: 100 mg via INTRAVENOUS

## 2020-12-29 MED ORDER — OXYCODONE HCL 5 MG PO TABS: 5.0000 mg | ORAL_TABLET | Freq: Once | ORAL | Status: DC | PRN

## 2020-12-29 MED ORDER — ROPIVACAINE HCL 5 MG/ML IJ SOLN
INTRAMUSCULAR | Status: DC | PRN
  Administered 2020-12-29: 10 mL via PERINEURAL
  Administered 2020-12-29: 20 mL via PERINEURAL

## 2020-12-29 MED ORDER — ONDANSETRON HCL 4 MG/2ML IJ SOLN
INTRAMUSCULAR | Status: DC | PRN
  Administered 2020-12-29: 4 mg via INTRAVENOUS

## 2020-12-29 MED ORDER — PROMETHAZINE HCL 25 MG/ML IJ SOLN: 6.2500 mg | INTRAMUSCULAR | Status: DC | PRN

## 2020-12-29 MED ORDER — ROPIVACAINE HCL 5 MG/ML IJ SOLN
INTRAMUSCULAR | Status: AC
  Filled 2020-12-29: qty 30

## 2020-12-29 MED ORDER — SODIUM CHLORIDE 0.9 % IV SOLN: INTRAVENOUS | Status: DC | PRN

## 2020-12-29 MED ORDER — FENTANYL CITRATE (PF) 100 MCG/2ML IJ SOLN: 50.0000 ug | Freq: Once | INTRAMUSCULAR | Status: AC

## 2020-12-29 MED ORDER — SODIUM ZIRCONIUM CYCLOSILICATE 5 G PO PACK
5.0000 g | PACK | Freq: Once | ORAL | Status: DC
  Filled 2020-12-29: qty 1

## 2020-12-29 MED ORDER — FENTANYL CITRATE (PF) 100 MCG/2ML IJ SOLN
INTRAMUSCULAR | Status: AC
  Administered 2020-12-29: 50 ug via INTRAVENOUS
  Filled 2020-12-29: qty 2

## 2020-12-29 MED ORDER — FENTANYL CITRATE (PF) 100 MCG/2ML IJ SOLN
INTRAMUSCULAR | Status: AC
  Filled 2020-12-29: qty 2

## 2020-12-29 SURGICAL SUPPLY — 55 items
30 degree outrigger post 11mm ×6 IMPLANT
5x9 xeroform IMPLANT
BLADE SURG 15 STRL LF DISP TIS (BLADE) ×2 IMPLANT
BLADE SURG 15 STRL SS (BLADE) ×1
BNDG COHESIVE 4X5 TAN STRL (GAUZE/BANDAGES/DRESSINGS) ×3 IMPLANT
BNDG ELASTIC 4X5.8 VLCR NS LF (GAUZE/BANDAGES/DRESSINGS) ×6 IMPLANT
BNDG ESMARK 6X12 TAN STRL LF (GAUZE/BANDAGES/DRESSINGS) ×3 IMPLANT
BNDG GAUZE 4.5X4.1 6PLY STRL (MISCELLANEOUS) ×3 IMPLANT
CLAMP LG COMBINATION (Clamp) ×12 IMPLANT
CLAMP PIN LRG 6 POSITION (Clamp) ×3 IMPLANT
COVER WAND RF STERILE (DRAPES) ×3 IMPLANT
CUFF TOURN SGL QUICK 24 (TOURNIQUET CUFF)
CUFF TOURN SGL QUICK 30 (TOURNIQUET CUFF) ×1
CUFF TRNQT CYL 24X4X16.5-23 (TOURNIQUET CUFF) IMPLANT
CUFF TRNQT CYL 30X4X21-28X (TOURNIQUET CUFF) ×2 IMPLANT
DRAPE C-ARM XRAY 36X54 (DRAPES) ×3 IMPLANT
DRAPE C-ARMOR (DRAPES) ×3 IMPLANT
DRAPE FLUOR MINI C-ARM 54X84 (DRAPES) IMPLANT
DRAPE INCISE IOBAN 66X45 STRL (DRAPES) IMPLANT
DRAPE U-SHAPE 47X51 STRL (DRAPES) ×3 IMPLANT
DURAPREP 26ML APPLICATOR (WOUND CARE) ×6 IMPLANT
ELECT REM PT RETURN 9FT ADLT (ELECTROSURGICAL) ×3
ELECTRODE REM PT RTRN 9FT ADLT (ELECTROSURGICAL) ×2 IMPLANT
GAUZE SPONGE 4X4 12PLY STRL (GAUZE/BANDAGES/DRESSINGS) ×3 IMPLANT
GAUZE XEROFORM 1X8 LF (GAUZE/BANDAGES/DRESSINGS) IMPLANT
GAUZE XEROFORM 5X9 LF (GAUZE/BANDAGES/DRESSINGS) ×3 IMPLANT
GLOVE SURG 9.0 ORTHO LTXF (GLOVE) ×6 IMPLANT
GLOVE SURG UNDER POLY LF SZ9 (GLOVE) ×3 IMPLANT
GOWN STRL REUS TWL 2XL XL LVL4 (GOWN DISPOSABLE) ×3 IMPLANT
GOWN STRL REUS W/ TWL LRG LVL3 (GOWN DISPOSABLE) ×2 IMPLANT
GOWN STRL REUS W/TWL LRG LVL3 (GOWN DISPOSABLE) ×1
KIT TURNOVER KIT A (KITS) ×3 IMPLANT
LABEL OR SOLS (LABEL) ×3 IMPLANT
MANIFOLD NEPTUNE II (INSTRUMENTS) ×3 IMPLANT
NS IRRIG 1000ML POUR BTL (IV SOLUTION) ×3 IMPLANT
OUTRIG POST 30X11 MR SAFE (MISCELLANEOUS) ×6
PACK EXTREMITY ARMC (MISCELLANEOUS) ×3 IMPLANT
PAD ABD DERMACEA PRESS 5X9 (GAUZE/BANDAGES/DRESSINGS) IMPLANT
PAD CAST CTTN 4X4 STRL (SOFTGOODS) IMPLANT
PADDING CAST COTTON 4X4 STRL (SOFTGOODS)
POST OUTRIGGER 30X11 MR SAFE (MISCELLANEOUS) ×4 IMPLANT
ROD CRBN FBR LRG EX-FX 11X300 (Rod) ×3 IMPLANT
ROD CRBN FBR LRG EX-FX 11X350 (Rod) ×3 IMPLANT
SCREW SCHNZ SD 5.0 80 THRD/200 (Screw) ×2 IMPLANT
SCREW SHANZ 5.0X175MM (Screw) ×6 IMPLANT
SCRW SCHANZ SD 5.0 80 THRD/200 (Screw) ×3 IMPLANT
SPLINT CAST 1 STEP 4X30 (MISCELLANEOUS) IMPLANT
SPONGE LAP 18X18 RF (DISPOSABLE) ×3 IMPLANT
STAPLER SKIN PROX 35W (STAPLE) IMPLANT
STOCKINETTE STRL 6IN 960660 (GAUZE/BANDAGES/DRESSINGS) ×3 IMPLANT
STRIP CLOSURE SKIN 1/2X4 (GAUZE/BANDAGES/DRESSINGS) IMPLANT
SUT VIC AB 2-0 SH 27 (SUTURE) ×2
SUT VIC AB 2-0 SH 27XBRD (SUTURE) ×4 IMPLANT
SYR 30ML LL (SYRINGE) ×3 IMPLANT
TAPE PAPER 2X10 WHT MICROPORE (GAUZE/BANDAGES/DRESSINGS) IMPLANT

## 2020-12-29 NOTE — Op Note (Signed)
12/27/2020 - 12/29/2020  2:40 PM  PATIENT:  Brad Santiago   MRN: [id number]  PRE-OPERATIVE DIAGNOSIS: Displaced left bimalleolar ankle fracture with concern for soft tissue compromise   POST-OPERATIVE DIAGNOSIS: Same  Procedure: Left ankle spanning external fixator  Surgeon: Davy Pique, MD   Anesthesia: LMA  EBL: 10 mL   Specimens: None   Drains: None   Implants: Synthes large external fixator  Findings: Large fracture blisters along the medial and lateral ankle as well as threatened skin overlying the medial malleolus fracture  Indications for surgery: Patient is an 80 year old male who came to the emergency department on 4/25 with a displaced bimalleolar ankle fracture dislocation.  He was reduced in the ER and placed in a short leg splint.  Postreduction x-rays showed notable instability of the fracture with lateral subluxation of the talus.  There was also presence of prominence of the medial malleolus given the lateral subluxation which was concerning for potential skin skin breakdown.  Patient skin was noted to have numerous scratches around the knee extending distal to the mid tibia from his cats.  Risk and benefits of operative and nonoperative management were discussed with the patient.  He expressed his understanding and desire to proceed with surgery.   Description of the procedure in detail: After informed consent was obtained and the appropriate extremity marked in the pre-operative holding area, the patient was taken to the operating room and placed in the supine position. All pressure points were well padded.  The splint was removed and there were notable fracture blisters and threatened skin over the medial malleolus.  Given concern regarding the quality of the soft tissue and potential infection and wound breakdown, decision was made to proceed with placement of an external fixator.  The left lower extremities then prepped and draped in the normal standard sterile  fashion.  Preoperative timeout was taken and preoperative antibiotics were administered.  2 5.0 mm partially-threaded pins were placed in the tibia adjacent to the fracture blistering and just distal to the area of skin which had significant scabbing and scratch marks from his cats.  Next, a calcaneal pin was placed from medial to lateral under fluoroscopic guidance.  The external fixator was then assembled.  The ankle was held in slight supination and traction, and appropriate reduction was confirmed with the use of fluoroscopy.  The external fixator was then tightened appropriately.  Final x-rays were obtained confirming appropriate hardware placement and ankle reduction.  Xeroform, Kerlix and Coban was then used to cover the pin sites as well as the areas of fracture blistering and threatened skin.  Patient was then awoken from anesthesia.  Patient tolerated the procedure well and there were no apparent complication. Patient was taken to the recovery room in good condition.   Postoperative plan: 1.  Continue antibiotics for 24 hours postop  2.  Nonweightbearing left lower extremity  3.  Daily pin care starting postop day 2 with mixture of saline and hydrogen peroxide.   4. Patient can be discharged pending pain control and social situation.  He can follow-up in approximately 7-10 days for reevaluation of the soft tissues in preparation for definitive fixation.    Ross Marcus, MD   12/29/2020 2:40 PM

## 2020-12-29 NOTE — Anesthesia Procedure Notes (Signed)
Anesthesia Regional Block: Popliteal block   Pre-Anesthetic Checklist: ,, timeout performed, Correct Patient, Correct Site, Correct Laterality, Correct Procedure, Correct Position, site marked, Risks and benefits discussed,  Surgical consent,  Pre-op evaluation,  At surgeon's request and post-op pain management  Laterality: Left  Prep: chloraprep       Needles:  Injection technique: Single-shot  Needle Type: Stimiplex     Needle Length: 10cm  Needle Gauge: 21     Additional Needles:   Procedures:,,,, ultrasound used (permanent image in chart),,,,  Narrative:  Start time: 12/29/2020 12:01 PM End time: 12/29/2020 12:11 PM Injection made incrementally with aspirations every 5 mL.  Performed by: Personally  Anesthesiologist: Alver Fisher, MD  Additional Notes: Functioning IV was confirmed and monitors were applied.  A Stimuplex needle was used. Sterile prep and drape,hand hygiene and sterile gloves were used.  Negative aspiration and negative test dose prior to incremental administration of local anesthetic. The patient tolerated the procedure well.  Supplemental saphenous block was also performed

## 2020-12-29 NOTE — Anesthesia Postprocedure Evaluation (Signed)
Anesthesia Post Note  Patient: Brad Santiago  Procedure(s) Performed: External Fixation Ankle (Left )  Patient location during evaluation: PACU Anesthesia Type: General Level of consciousness: awake and alert and oriented Pain management: pain level controlled Vital Signs Assessment: post-procedure vital signs reviewed and stable Respiratory status: spontaneous breathing, nonlabored ventilation and respiratory function stable Cardiovascular status: blood pressure returned to baseline and stable Postop Assessment: no signs of nausea or vomiting Anesthetic complications: no   No complications documented.   Last Vitals:  Vitals:   12/29/20 1500 12/29/20 1516  BP: (!) 133/51   Pulse: 66 62  Resp: 15 14  Temp:    SpO2: 95% 90%    Last Pain:  Vitals:   12/29/20 1445  TempSrc:   PainSc: Asleep                 Ramsey Midgett

## 2020-12-29 NOTE — Transfer of Care (Signed)
Immediate Anesthesia Transfer of Care Note  Patient: Brad Santiago  Procedure(s) Performed: External Fixation Ankle (Left )  Patient Location: PACU  Anesthesia Type:General  Level of Consciousness: awake, alert  and oriented  Airway & Oxygen Therapy: Patient Spontanous Breathing  Post-op Assessment: Report given to RN and Post -op Vital signs reviewed and stable  Post vital signs: Reviewed and stable  Last Vitals:  Vitals Value Taken Time  BP 118/60 12/29/20 1445  Temp 36.4 C 12/29/20 1445  Pulse 82 12/29/20 1454  Resp 21 12/29/20 1454  SpO2 96 % 12/29/20 1454  Vitals shown include unvalidated device data.  Last Pain:  Vitals:   12/29/20 1445  TempSrc:   PainSc: Asleep      Patients Stated Pain Goal: 0 (12/28/20 0231)  Complications: No complications documented.

## 2020-12-29 NOTE — Progress Notes (Signed)
Patient confused at this time.  Is now oriented to time but unsure of where he is.  Pt asking for/talking to parents and someone else that is not here.  Per documentation was A&O X 4 previously.  Was not oriented to time before so has improved some with mentation.  Continues to slowly improve while in pacu.  Per dr Henrene Hawking, ok to return to room.  Dr Natale Milch made aware.

## 2020-12-29 NOTE — Progress Notes (Signed)
Patient left for surgery.

## 2020-12-29 NOTE — Plan of Care (Deleted)
Doing well ready for discharge

## 2020-12-29 NOTE — Progress Notes (Signed)
Pt alert and oriented now. Family at bedside was who he was asking about. Patient continues to be drowsy at this time but will wake up and respond to voice. States no feeling in feet at this time. Will continue to monitor. Stable on Room air.

## 2020-12-29 NOTE — Anesthesia Procedure Notes (Signed)
Procedure Name: Intubation Date/Time: 12/29/2020 12:56 PM Performed by: Nelda Marseille, CRNA Pre-anesthesia Checklist: Patient identified, Patient being monitored, Timeout performed, Emergency Drugs available and Suction available Patient Re-evaluated:Patient Re-evaluated prior to induction Oxygen Delivery Method: Circle system utilized Preoxygenation: Pre-oxygenation with 100% oxygen Induction Type: IV induction Ventilation: Mask ventilation without difficulty Laryngoscope Size: Mac, 3 and McGraph Grade View: Grade I Tube type: Oral Tube size: 7.5 mm Number of attempts: 1 Airway Equipment and Method: Stylet Placement Confirmation: ETT inserted through vocal cords under direct vision,  positive ETCO2 and breath sounds checked- equal and bilateral Secured at: 21 cm Tube secured with: Tape Dental Injury: Teeth and Oropharynx as per pre-operative assessment

## 2020-12-29 NOTE — Progress Notes (Signed)
PROGRESS NOTE    Brad Santiago  IOX:[id number] DOB: Mar 09, 1941 DOA: 12/27/2020 PCP: Dorothey Baseman, MD   Brief Narrative:  Brad Santiago is a 80 y.o. male with medical history significant for hypertension, non-insulin-dependent diabetes mellitus, hypothyroid, hyperlipidemia, peripheral neuropathy, presents to the emergency department for chief concerns of loss of consciousness. Patient states he was sitting down on his chair when he felt weak in the last thing he remembered was seen the coffee table about to catch his chin as he fell forward. He woke up on the floor.  He states he does not remember anything else.  His son at bedside states that when his father came to and his father called him. [name] reports that he has not had his medications on day of presentation.  He states that the last time he took his medications was yesterday at 3:30 PM, and one of his DM medications and gabapentin at night at approximately midnight.  Imaging in the ED shows left ankle fracture, hospitalist called for admit.  Orthopedic surgery consulted for possible procedure.  Assessment & Plan:   Active Problems:   Closed left ankle fracture   Diabetes mellitus type 2, noninsulin dependent (HCC)   Essential hypertension   Hyperlipidemia   Acquired hypothyroidism   AKI (acute kidney injury) (HCC)   Acute metabolic encephalopathy   Closed left ankle fracture-present on admission secondary to syncope and fall - Comminuted fractures of the medial, lateral, and posterior malleolus on imaging  -  Dr. Crawford/orthopedics aware, appreciate insight and recommendations -surgery planned later today 12/29/2020 - Pain control with acetaminophen 650 mg p.o. every 6 hours as needed for mild pain, fever, headache, ketorolac 50 mg IV every 6 hours as needed for moderate pain, morphine 0.5 mg IV every 2 hours as needed for severe pain ordered -attempting to limit this medication given mental status depression at intake although  appears to be improving and back to baseline at this point  Acute metabolic encephalopathy versus syncope- likely secondary to hypoglycemia Rule out polypharmacy although less likely - Improved with support - now AOx4 - Dextrose 50 IV as needed for low blood sugar ordered - Continue to hold CNS depressing medications as possible (he has been on pain regimen in the setting of fracture below)  AKI without history of CKD, stable Concurrent hyperkalemia, mild - Creatinine stable around 1.6-1.7 despite IV fluids -  Lokelma given today given mild hyperkalemia -Follow morning labs  Non-insulin-dependent diabetes mellitus, uncontrolled with hypoglycemia as above - Holding home glipizide-metformin-Actos Lab Results  Component Value Date   HGBA1C 6.1 (H) 12/27/2020  - Recommend on discharge to discontinue glipizide and Actos given profound hypoglycemia -although would expect A1c to be lower if this was a chronic hypoglycemic issue, this could be more acute in the setting of dietary changes. Close follow-up with PCP or endocrinology at discharge. - Insulin sliding scale with at bedtime coverage ordered - Dextrose 50 as needed for hypoglycemia  Hyperlipidemia - Atorvastatin 80 mg nightly ongoing  Hypertension - Controlled despite patient not taking any antihypertensives today - Amlodipine 5 mg p.o. resumed for 12/28/2020 - Hydralazine 25 mg p.o. every 8 hours as needed for SBP greater than 175  Depression/anxiety - Escitalopram 20 mg daily resumed  DVT prophylaxis: TED hose to the right lower extremity Code Status: Full code Family Communication: None present  Status is: Inpatient  Dispo: The patient is from: Home  Anticipated d/c is to: To be determined              Anticipated d/c date is: 48 to 72 hours pending clinical course and postoperative physical therapy and ambulatory needs              Patient currently not medically stable for discharge given ongoing need  for ongoing intervention, surgery, close monitoring in setting of profound hypoglycemia  Consultants:   Orthopedic Surgery  Procedures:   Pending ortho evaluation  Antimicrobials:  None indicated  Subjective: No major issues or events overnight, left foot pain and ankle pain somewhat worse this morning, it appears he had slept quite well overnight and had fallen off of his as needed regimen and, otherwise denies chest pain shortness of breath nausea vomiting diarrhea constipation headache fevers or chills.  He also somewhat anxious about his wife's care at home with him not present but we discussed that if he wants to go home and be useful to her he will need to first care for himself.  Objective: Vitals:   12/28/20 1219 12/28/20 1623 12/28/20 1945 12/29/20 0446  BP: (!) 133/52 (!) 97/48 (!) 100/50 (!) 109/52  Pulse: 68 66 65 69  Resp: 18 18 17 16   Temp: 98.1 F (36.7 C) 98.3 F (36.8 C) 98.5 F (36.9 C) 98.7 F (37.1 C)  TempSrc:   Oral Oral  SpO2: 100% 97% 91% 90%  Weight:      Height:        Intake/Output Summary (Last 24 hours) at 12/29/2020 0720 Last data filed at 12/29/2020 0448 Gross per 24 hour  Intake --  Output 400 ml  Net -400 ml   Filed Weights   12/27/20 1751  Weight: 104.3 kg    Examination:  General:  Pleasantly resting in bed, No acute distress. HEENT:  Normocephalic atraumatic.  Sclerae nonicteric, noninjected.  Extraocular movements intact bilaterally. Neck:  Without mass or deformity.  Trachea is midline. Lungs:  Clear to auscultate bilaterally without rhonchi, wheeze, or rales. Heart:  Regular rate and rhythm.  Without murmurs, rubs, or gallops. Abdomen:  Soft, nontender, nondistended.  Without guarding or rebound. Extremities: Without cyanosis, clubbing, left foot splint/bandage clean dry intact Vascular:  Dorsalis pedis and posterior tibial pulses palpable bilaterally. Skin:  Warm and dry, no erythema, no ulcerations.   Data Reviewed: I  have personally reviewed following labs and imaging studies  CBC: Recent Labs  Lab 12/27/20 1759 12/28/20 0348 12/29/20 0525  WBC 6.5 7.6 8.4  HGB 10.2* 9.4* 9.0*  HCT 32.4* 29.2* 27.5*  MCV 99.4 98.3 99.3  PLT 246 210 194   Basic Metabolic Panel: Recent Labs  Lab 12/27/20 1759 12/28/20 0348 12/29/20 0525  NA 137 138 138  K 4.8 5.2* 5.3*  CL 108 109 110  CO2 24 23 22   GLUCOSE 39* 99 168*  BUN 38* 40* 41*  CREATININE 1.72* 1.74* 1.67*  CALCIUM 8.5* 8.6* 8.2*   GFR: Estimated Creatinine Clearance: 44.1 mL/min (A) (by C-G formula based on SCr of 1.67 mg/dL (H)). Liver Function Tests: Recent Labs  Lab 12/27/20 1759  AST 24  ALT 12  ALKPHOS 78  BILITOT 0.6  PROT 6.8  ALBUMIN 3.7   No results for input(s): LIPASE, AMYLASE in the last 168 hours. No results for input(s): AMMONIA in the last 168 hours. Coagulation Profile: No results for input(s): INR, PROTIME in the last 168 hours. Cardiac Enzymes: No results for input(s): CKTOTAL, CKMB, CKMBINDEX, TROPONINI in the  last 168 hours. BNP (last 3 results) No results for input(s): PROBNP in the last 8760 hours. HbA1C: Recent Labs    12/27/20 2020  HGBA1C 6.1*   CBG: Recent Labs  Lab 12/27/20 2313 12/28/20 0819 12/28/20 1237 12/28/20 1626 12/28/20 2050  GLUCAP 121* 100* 118* 205* 199*   Lipid Profile: No results for input(s): CHOL, HDL, LDLCALC, TRIG, CHOLHDL, LDLDIRECT in the last 72 hours. Thyroid Function Tests: Recent Labs    12/27/20 1759  TSH 1.614   Anemia Panel: No results for input(s): VITAMINB12, FOLATE, FERRITIN, TIBC, IRON, RETICCTPCT in the last 72 hours. Sepsis Labs: No results for input(s): PROCALCITON, LATICACIDVEN in the last 168 hours.  Recent Results (from the past 240 hour(s))  SARS CORONAVIRUS 2 (TAT 6-24 HRS) Nasopharyngeal Nasopharyngeal Swab     Status: None   Collection Time: 12/27/20  9:16 PM   Specimen: Nasopharyngeal Swab  Result Value Ref Range Status   SARS  Coronavirus 2 NEGATIVE NEGATIVE Final    Comment: (NOTE) SARS-CoV-2 target nucleic acids are NOT DETECTED.  The SARS-CoV-2 RNA is generally detectable in upper and lower respiratory specimens during the acute phase of infection. Negative results do not preclude SARS-CoV-2 infection, do not rule out co-infections with other pathogens, and should not be used as the sole basis for treatment or other patient management decisions. Negative results must be combined with clinical observations, patient history, and epidemiological information. The expected result is Negative.  Fact Sheet for Patients: HairSlick.no  Fact Sheet for Healthcare Providers: quierodirigir.com  This test is not yet approved or cleared by the [place] FDA and  has been authorized for detection and/or diagnosis of SARS-CoV-2 by FDA under an Emergency Use Authorization (EUA). This EUA will remain  in effect (meaning this test can be used) for the duration of the COVID-19 declaration under Se ction 564(b)(1) of the Act, 21 U.S.C. section 360bbb-3(b)(1), unless the authorization is terminated or revoked sooner.  Performed at [facility], [address], [place], [place] [address]          Radiology Studies: DG Ankle 2 Views Left  Result Date: 12/27/2020 CLINICAL DATA:  Postreduction left ankle fractures. EXAM: LEFT ANKLE - 2 VIEW COMPARISON:  12/27/2020 FINDINGS: Interval placement of cast material which obscures some bone detail. Comminuted fractures are demonstrated in the medial, lateral, and posterior malleolus. Improved alignment of the fracture fragments postreduction but there is still about 6 mm lateral displacement of the talus and medial malleolar fragment with respect to the tibia, resulting in a gap in the articular surface. Degenerative changes in the intertarsal joints. IMPRESSION: Comminuted fractures of the medial, lateral, and  posterior malleolus with improved alignment postreduction but persistent lateral displacement of the talus and medial malleolar fragment with respect to the tibia. Electronically Signed   By: Burman Nieves M.D.   On: 12/27/2020 20:05   DG Ankle Complete Left  Result Date: 12/27/2020 CLINICAL DATA:  Ankle pain and swelling after fall EXAM: LEFT ANKLE COMPLETE - 3+ VIEW COMPARISON:  None. FINDINGS: Displaced fracture of the medial malleolus with apparent preservation of the medial clear space. Minimally displaced fracture of the posterior tibial plafond. Displaced fracture of the distal fibula with apparent preservation of the lateral clear space. Diffuse soft tissue swelling about the ankle. IMPRESSION: Displaced trimalleolar fracture with diffuse soft tissue swelling about the ankle. Electronically Signed   By: Maudry Mayhew MD   On: 12/27/2020 18:30   CT Head Wo Contrast  Result Date:  12/27/2020 CLINICAL DATA:  Fall EXAM: CT HEAD WITHOUT CONTRAST CT MAXILLOFACIAL WITHOUT CONTRAST TECHNIQUE: Multidetector CT imaging of the head and maxillofacial structures were performed using the standard protocol without intravenous contrast. Multiplanar CT image reconstructions of the maxillofacial structures were also generated. COMPARISON:  None. FINDINGS: CT HEAD FINDINGS Brain: There is no mass, hemorrhage or extra-axial collection. There is generalized atrophy without lobar predilection. There is hypoattenuation of the periventricular white matter, most commonly indicating chronic ischemic microangiopathy. Vascular: No hyperdense vessel or unexpected vascular calcification. Skull: The visualized skull base, calvarium and extracranial soft tissues are normal. CT MAXILLOFACIAL FINDINGS Osseous: --Complex facial fracture types: No LeFort, zygomaticomaxillary complex or nasoorbitoethmoidal fracture. --Simple fracture types: None. --Mandible, hard palate and teeth: No acute abnormality. Two areas of cystic change in  the maxilla at the roots of absent incisors. Orbits: The globes and optic nerves are intact. Normal extraocular muscles and intraorbital fat. Sinuses: No acute finding. Soft tissues: Normal visualized extracranial soft tissues. IMPRESSION: 1. Chronic ischemic microangiopathy and generalized atrophy without acute intracranial abnormality. 2. No facial fracture. Electronically Signed   By: Deatra RobinsonKevin  Herman M.D.   On: 12/27/2020 19:13   CT Maxillofacial Wo Contrast  Result Date: 12/27/2020 CLINICAL DATA:  Fall EXAM: CT HEAD WITHOUT CONTRAST CT MAXILLOFACIAL WITHOUT CONTRAST TECHNIQUE: Multidetector CT imaging of the head and maxillofacial structures were performed using the standard protocol without intravenous contrast. Multiplanar CT image reconstructions of the maxillofacial structures were also generated. COMPARISON:  None. FINDINGS: CT HEAD FINDINGS Brain: There is no mass, hemorrhage or extra-axial collection. There is generalized atrophy without lobar predilection. There is hypoattenuation of the periventricular white matter, most commonly indicating chronic ischemic microangiopathy. Vascular: No hyperdense vessel or unexpected vascular calcification. Skull: The visualized skull base, calvarium and extracranial soft tissues are normal. CT MAXILLOFACIAL FINDINGS Osseous: --Complex facial fracture types: No LeFort, zygomaticomaxillary complex or nasoorbitoethmoidal fracture. --Simple fracture types: None. --Mandible, hard palate and teeth: No acute abnormality. Two areas of cystic change in the maxilla at the roots of absent incisors. Orbits: The globes and optic nerves are intact. Normal extraocular muscles and intraorbital fat. Sinuses: No acute finding. Soft tissues: Normal visualized extracranial soft tissues. IMPRESSION: 1. Chronic ischemic microangiopathy and generalized atrophy without acute intracranial abnormality. 2. No facial fracture. Electronically Signed   By: Deatra RobinsonKevin  Herman M.D.   On: 12/27/2020  19:13   Scheduled Meds: . amLODipine  5 mg Oral Daily  . atorvastatin  80 mg Oral q1800  . ceFAZolin  2 g Other To OR  . escitalopram  20 mg Oral Daily  . feeding supplement  237 mL Oral BID BM  . insulin aspart  0-5 Units Subcutaneous QHS  . insulin aspart  0-9 Units Subcutaneous TID WC  . levothyroxine  25 mcg Oral Q0600     LOS: 1 day   Time spent: 40min  Azucena FallenWilliam C Kellyn Mansfield, DO [facility]  If 7PM-7AM, please contact night-coverage [email or web address]  12/29/2020, 7:20 AM

## 2020-12-30 ENCOUNTER — Encounter: Payer: Self-pay | Admitting: Internal Medicine

## 2020-12-30 DIAGNOSIS — E1169 Type 2 diabetes mellitus with other specified complication: Secondary | ICD-10-CM

## 2020-12-30 DIAGNOSIS — E875 Hyperkalemia: Secondary | ICD-10-CM | POA: Diagnosis not present

## 2020-12-30 DIAGNOSIS — S82892S Other fracture of left lower leg, sequela: Secondary | ICD-10-CM

## 2020-12-30 DIAGNOSIS — E039 Hypothyroidism, unspecified: Secondary | ICD-10-CM | POA: Diagnosis not present

## 2020-12-30 DIAGNOSIS — E785 Hyperlipidemia, unspecified: Secondary | ICD-10-CM

## 2020-12-30 DIAGNOSIS — W5503XA Scratched by cat, initial encounter: Secondary | ICD-10-CM

## 2020-12-30 DIAGNOSIS — F32A Depression, unspecified: Secondary | ICD-10-CM

## 2020-12-30 DIAGNOSIS — S82892A Other fracture of left lower leg, initial encounter for closed fracture: Secondary | ICD-10-CM | POA: Diagnosis not present

## 2020-12-30 DIAGNOSIS — N189 Chronic kidney disease, unspecified: Secondary | ICD-10-CM

## 2020-12-30 LAB — CBC
HCT: 27.3 % — ABNORMAL LOW (ref 39.0–52.0)
Hemoglobin: 8.9 g/dL — ABNORMAL LOW (ref 13.0–17.0)
MCH: 31.7 pg (ref 26.0–34.0)
MCHC: 32.6 g/dL (ref 30.0–36.0)
MCV: 97.2 fL (ref 80.0–100.0)
Platelets: 204 10*3/uL (ref 150–400)
RBC: 2.81 MIL/uL — ABNORMAL LOW (ref 4.22–5.81)
RDW: 13.7 % (ref 11.5–15.5)
WBC: 10.4 10*3/uL (ref 4.0–10.5)
nRBC: 0 % (ref 0.0–0.2)

## 2020-12-30 LAB — BASIC METABOLIC PANEL
Anion gap: 7 (ref 5–15)
BUN: 37 mg/dL — ABNORMAL HIGH (ref 8–23)
CO2: 21 mmol/L — ABNORMAL LOW (ref 22–32)
Calcium: 7.8 mg/dL — ABNORMAL LOW (ref 8.9–10.3)
Chloride: 106 mmol/L (ref 98–111)
Creatinine, Ser: 1.31 mg/dL — ABNORMAL HIGH (ref 0.61–1.24)
GFR, Estimated: 55 mL/min — ABNORMAL LOW (ref >60)
Glucose, Bld: 205 mg/dL — ABNORMAL HIGH (ref 70–99)
Potassium: 5.1 mmol/L (ref 3.5–5.1)
Sodium: 134 mmol/L — ABNORMAL LOW (ref 135–145)

## 2020-12-30 LAB — GLUCOSE, CAPILLARY
Glucose-Capillary: 192 mg/dL — ABNORMAL HIGH (ref 70–99)
Glucose-Capillary: 227 mg/dL — ABNORMAL HIGH (ref 70–99)
Glucose-Capillary: 147 mg/dL — ABNORMAL HIGH (ref 70–99)
Glucose-Capillary: 190 mg/dL — ABNORMAL HIGH (ref 70–99)

## 2020-12-30 LAB — SURGICAL PCR SCREEN
MRSA, PCR: NEGATIVE
Staphylococcus aureus: NEGATIVE

## 2020-12-30 MED ORDER — AZITHROMYCIN 500 MG PO TABS
500.0000 mg | ORAL_TABLET | Freq: Every day | ORAL | Status: AC
  Administered 2020-12-30: 500 mg via ORAL
  Filled 2020-12-30: qty 1

## 2020-12-30 MED ORDER — OXYCODONE-ACETAMINOPHEN 5-325 MG PO TABS
1.0000 | ORAL_TABLET | ORAL | Status: DC | PRN
  Administered 2020-12-30 – 2021-01-11 (×22): 1 via ORAL
  Filled 2020-12-30 (×24): qty 1

## 2020-12-30 MED ORDER — SODIUM ZIRCONIUM CYCLOSILICATE 10 G PO PACK
10.0000 g | PACK | Freq: Every day | ORAL | Status: DC
  Administered 2020-12-30 – 2020-12-31 (×2): 10 g via ORAL
  Filled 2020-12-30 (×2): qty 1

## 2020-12-30 MED ORDER — AMOXICILLIN-POT CLAVULANATE 875-125 MG PO TABS: 1.0000 | ORAL_TABLET | Freq: Two times a day (BID) | ORAL | Status: DC

## 2020-12-30 MED ORDER — AZITHROMYCIN 250 MG PO TABS
250.0000 mg | ORAL_TABLET | Freq: Every day | ORAL | Status: AC
  Administered 2020-12-31 – 2021-01-03 (×4): 250 mg via ORAL
  Filled 2020-12-30 (×4): qty 1

## 2020-12-30 NOTE — Evaluation (Signed)
Occupational Therapy Evaluation Patient Details Name: Trentan Trippe Sides MRN: [id number] DOB: July 03, 1941 Today's Date: 12/30/2020    History of Present Illness Armanie J Bellantoni is a 80 y.o. male with medical history significant for hypertension, non-insulin-dependent diabetes mellitus, hypothyroid, hyperlipidemia, peripheral neuropathy, presents to the emergency department for chief concerns of passing out. Patient states he was sitting down on his chair when he felt weak in the last thing he remembered was seeing the coffee table about to catch his chin as he fell forward. He woke up on the floor.  He states he does not remember anything else.  Patient sustained a bimalleolar L ankle fracture. S/P external fixation.   Clinical Impression   Pt seen for OT Evaluation this date in setting of acute hospitalization d/p external fixation to L ankle. Pt reports being INDEP at baseline including driving and getting his own groceries. Endorses using shopping cart for support in the store. States he has a son that lives nearby. Pt requires having RW and built in shower seat at home that he can use if he needs to but does not use at baseline. This date, pt presents with edema and decreased ROM to L LE d/t injury and eternal fixator. Pt requires MIN A for bed mobility and MOD A +2 for STS with RW and cues to sequence.  Pt requires MOD/MAX A for LB ADLs including bathing/dressing. Will continue to follow acutely. Anticipate pt will require f/u OT services in a STR setting upon d/c from hospital to restore highest level of INDEP and function for safety in the home environment.    Follow Up Recommendations  SNF    Equipment Recommendations  3 in 1 bedside commode;Tub/shower seat    Recommendations for Other Services       Precautions / Restrictions Precautions Precautions: Fall Required Braces or Orthoses: Other Brace Other Brace: external fixator Restrictions Weight Bearing Restrictions: Yes LLE Weight Bearing:  Non weight bearing      Mobility Bed Mobility Overal bed mobility: Needs Assistance Bed Mobility: Supine to Sit;Sit to Supine     Supine to sit: Min assist Sit to supine: Min assist   General bed mobility comments: increased time, assist for L LE back to bed    Transfers Overall transfer level: Needs assistance Equipment used: Rolling walker (2 wheeled) Transfers: Sit to/from Stand Sit to Stand: Mod assist;+2 physical assistance;From elevated surface         General transfer comment: increased time, assist for posterior support for lift off, assist +2 to support UB on each side. cues to maintain NWB to L LE.    Balance Overall balance assessment: Needs assistance Sitting-balance support: Feet supported;Bilateral upper extremity supported Sitting balance-Leahy Scale: Good     Standing balance support: Bilateral upper extremity supported;During functional activity Standing balance-Leahy Scale: Poor Standing balance comment: Patient able to static stand with +2 min assist and RW. Cues to keep left LE NWB.                           ADL either performed or assessed with clinical judgement   ADL                                         General ADL Comments: requires MOD/MAX A for LB ADLs, SETUP/MIN A for seated UB ADLs. MOD +2 for ADL transfers  Vision Patient Visual Report: No change from baseline       Perception     Praxis      Pertinent Vitals/Pain Pain Assessment: No/denies pain     Hand Dominance     Extremity/Trunk Assessment Upper Extremity Assessment Upper Extremity Assessment: Overall WFL for tasks assessed;Generalized weakness (ROM WFL, MMT grossly 4-/5)   Lower Extremity Assessment Lower Extremity Assessment: RLE deficits/detail;LLE deficits/detail RLE Deficits / Details: ROM somewhat limited for hip rotation (appropriate give age). MMT grossly WFL RLE Sensation: history of peripheral neuropathy LLE Deficits /  Details: patient has no feeling in his left foot, cannot move toes at all LLE Sensation: history of peripheral neuropathy;decreased light touch LLE Coordination: decreased fine motor;decreased gross motor   Cervical / Trunk Assessment Cervical / Trunk Assessment: Normal   Communication Communication Communication: No difficulties   Cognition Arousal/Alertness: Awake/alert Behavior During Therapy: WFL for tasks assessed/performed Overall Cognitive Status: Within Functional Limits for tasks assessed                                     General Comments       Exercises Other Exercises Other Exercises: OT faciltiates ed re: role of OT in acute setting. Pt with moderate reception. requires gentle re-direction with education throughout.   Shoulder Instructions      Home Living Family/patient expects to be discharged to:: Private residence Living Arrangements: Alone Available Help at Discharge: Family;Available PRN/intermittently (son lives within 10 miles) Type of Home: House Home Access: Ramped entrance     Home Layout: One level               Home Equipment: Environmental consultant - 2 wheels;Wheelchair - manual;Cane - single point   Additional Comments: patient has one step to get into bathroom.      Prior Functioning/Environment Level of Independence: Independent with assistive device(s)                 OT Problem List: Decreased strength;Decreased range of motion;Decreased activity tolerance;Impaired balance (sitting and/or standing);Decreased knowledge of use of DME or AE;Impaired sensation;Increased edema      OT Treatment/Interventions: Self-care/ADL training;DME and/or AE instruction;Therapeutic activities;Balance training;Therapeutic exercise;Patient/family education    OT Goals(Current goals can be found in the care plan section) Acute Rehab OT Goals Patient Stated Goal: to have cats brought here OT Goal Formulation: With patient Time For Goal  Achievement: 01/13/21 Potential to Achieve Goals: Good ADL Goals Pt Will Perform Lower Body Dressing: with mod assist;sitting/lateral leans (with AE PRN) Pt Will Transfer to Toilet: with min assist;stand pivot transfer;bedside commode Pt Will Perform Toileting - Clothing Manipulation and hygiene: with min assist;sit to/from stand Pt/caregiver will Perform Home Exercise Program: Increased strength;Both right and left upper extremity;With Supervision  OT Frequency: Min 1X/week   Barriers to D/C:            Co-evaluation PT/OT/SLP Co-Evaluation/Treatment: Yes Reason for Co-Treatment: To address functional/ADL transfers;Complexity of the patient's impairments (multi-system involvement);For patient/therapist safety PT goals addressed during session: Mobility/safety with mobility;Balance;Proper use of DME OT goals addressed during session: ADL's and self-care      AM-PAC OT "6 Clicks" Daily Activity     Outcome Measure Help from another person eating meals?: None Help from another person taking care of personal grooming?: A Little Help from another person toileting, which includes using toliet, bedpan, or urinal?: A Lot Help from another person bathing (including washing,  rinsing, drying)?: A Lot Help from another person to put on and taking off regular upper body clothing?: A Little Help from another person to put on and taking off regular lower body clothing?: A Lot 6 Click Score: 16   End of Session Equipment Utilized During Treatment: Gait belt;Rolling walker Nurse Communication: Mobility status  Activity Tolerance: Patient tolerated treatment well Patient left: in bed;with call bell/phone within reach;with bed alarm set  OT Visit Diagnosis: Unsteadiness on feet (R26.81);Muscle weakness (generalized) (M62.81);History of falling (Z91.81)                Time: 1000-1038 OT Time Calculation (min): 38 min Charges:  OT General Charges $OT Visit: 1 Visit OT Evaluation $OT Eval  Moderate Complexity: 1 Mod OT Treatments $Self Care/Home Management : 8-22 mins  Rejeana Brock, MS, OTR/L ascom [phone] 12/30/20, 4:56 PM

## 2020-12-30 NOTE — Progress Notes (Signed)
Patient ID: Brad Santiago, male   DOB: 1941/01/16, 80 y.o.   MRN: [id number] [facility] PROGRESS NOTE  Brad Santiago VEL:[id number] DOB: 23-Jul-1941 DOA: 12/27/2020 PCP: Dorothey Baseman, MD  HPI/Subjective: Patient feeling okay.  He stated he had a low sugar of 34 when EMS picked him up.  He has cat scratches all over his legs.  He has external hardware for his ankle fracture.  Objective: Vitals:   12/30/20 0851 12/30/20 1554  BP: (!) 114/49 (!) 114/54  Pulse: 70 64  Resp: 17 16  Temp: 97.9 F (36.6 C) 98.6 F (37 C)  SpO2: 97% 94%    Intake/Output Summary (Last 24 hours) at 12/30/2020 1600 Last data filed at 12/30/2020 1350 Gross per 24 hour  Intake 580 ml  Output 550 ml  Net 30 ml   Filed Weights   12/27/20 1751  Weight: 104.3 kg    ROS: Review of Systems  Respiratory: Negative for cough and shortness of breath.   Cardiovascular: Negative for chest pain.  Gastrointestinal: Negative for abdominal pain, nausea and vomiting.  Musculoskeletal: Positive for joint pain.   Exam: Physical Exam HENT:     Head: Normocephalic.     Mouth/Throat:     Pharynx: No oropharyngeal exudate.  Eyes:     General: Lids are normal.     Conjunctiva/sclera: Conjunctivae normal.     Pupils: Pupils are equal, round, and reactive to light.  Cardiovascular:     Rate and Rhythm: Normal rate and regular rhythm.     Heart sounds: Normal heart sounds, S1 normal and S2 normal.  Pulmonary:     Breath sounds: No decreased breath sounds, wheezing, rhonchi or rales.  Abdominal:     Palpations: Abdomen is soft.     Tenderness: There is no abdominal tenderness.  Musculoskeletal:     Right ankle: Swelling present.     Left ankle: Swelling present.  Skin:    General: Skin is warm.     Comments: Numerous Cat Scratches Bilateral Lower Extremities.  External hardware left ankle.  Neurological:     Mental Status: He is alert and oriented to person, place, and time.       Data Reviewed: Basic  Metabolic Panel: Recent Labs  Lab 12/27/20 1759 12/28/20 0348 12/29/20 0525 12/30/20 0531  NA 137 138 138 134*  K 4.8 5.2* 5.3* 5.1  CL 108 109 110 106  CO2 24 23 22  21*  GLUCOSE 39* 99 168* 205*  BUN 38* 40* 41* 37*  CREATININE 1.72* 1.74* 1.67* 1.31*  CALCIUM 8.5* 8.6* 8.2* 7.8*   Liver Function Tests: Recent Labs  Lab 12/27/20 1759  AST 24  ALT 12  ALKPHOS 78  BILITOT 0.6  PROT 6.8  ALBUMIN 3.7   CBC: Recent Labs  Lab 12/27/20 1759 12/28/20 0348 12/29/20 0525 12/30/20 0531  WBC 6.5 7.6 8.4 10.4  HGB 10.2* 9.4* 9.0* 8.9*  HCT 32.4* 29.2* 27.5* 27.3*  MCV 99.4 98.3 99.3 97.2  PLT 246 210 194 204    CBG: Recent Labs  Lab 12/29/20 1448 12/29/20 1657 12/29/20 2042 12/30/20 0750 12/30/20 1138  GLUCAP 117* 166* 261* 192* 227*    Recent Results (from the past 240 hour(s))  SARS CORONAVIRUS 2 (TAT 6-24 HRS) Nasopharyngeal Nasopharyngeal Swab     Status: None   Collection Time: 12/27/20  9:16 PM   Specimen: Nasopharyngeal Swab  Result Value Ref Range Status   SARS Coronavirus 2 NEGATIVE NEGATIVE Final    Comment: (NOTE)  SARS-CoV-2 target nucleic acids are NOT DETECTED.  The SARS-CoV-2 RNA is generally detectable in upper and lower respiratory specimens during the acute phase of infection. Negative results do not preclude SARS-CoV-2 infection, do not rule out co-infections with other pathogens, and should not be used as the sole basis for treatment or other patient management decisions. Negative results must be combined with clinical observations, patient history, and epidemiological information. The expected result is Negative.  Fact Sheet for Patients: HairSlick.no  Fact Sheet for Healthcare Providers: quierodirigir.com  This test is not yet approved or cleared by the [place] FDA and  has been authorized for detection and/or diagnosis of SARS-CoV-2 by FDA under an Emergency Use  Authorization (EUA). This EUA will remain  in effect (meaning this test can be used) for the duration of the COVID-19 declaration under Se ction 564(b)(1) of the Act, 21 U.S.C. section 360bbb-3(b)(1), unless the authorization is terminated or revoked sooner.  Performed at [facility], [address] [place], [place] [address]   Surgical PCR screen     Status: None   Collection Time: 12/29/20  7:45 AM   Specimen: Nasal Mucosa; Nasal Swab  Result Value Ref Range Status   MRSA, PCR NEGATIVE NEGATIVE Final   Staphylococcus aureus NEGATIVE NEGATIVE Final    Comment: (NOTE) The Xpert SA Assay (FDA approved for NASAL specimens in patients 15 years of age and older), is one component of a comprehensive surveillance program. It is not intended to diagnose infection nor to guide or monitor treatment. Performed at [facility], [address] [place], [place] [address]      Studies: DG Ankle 2 Views Left  Result Date: 12/29/2020 CLINICAL DATA:  Left ankle external fixation. EXAM: LEFT ANKLE - 2 VIEW; DG C-ARM 1-60 MIN COMPARISON:  Radiograph 12/27/2020 FINDINGS: Four fluoroscopic spot views obtained in the operating room during fixator placement. Pins in the tibia and calcaneus. Improved alignment of distal tibia and fibular fractures. Fluoroscopy time and dose not provided by technologist. IMPRESSION: Intraoperative fluoroscopy during external fixator placement. Electronically Signed   By: Narda Rutherford M.D.   On: 12/29/2020 15:08   DG C-Arm 1-60 Min  Result Date: 12/29/2020 CLINICAL DATA:  Left ankle external fixation. EXAM: LEFT ANKLE - 2 VIEW; DG C-ARM 1-60 MIN COMPARISON:  Radiograph 12/27/2020 FINDINGS: Four fluoroscopic spot views obtained in the operating room during fixator placement. Pins in the tibia and calcaneus. Improved alignment of distal tibia and fibular fractures. Fluoroscopy time and dose not provided by technologist. IMPRESSION: Intraoperative  fluoroscopy during external fixator placement. Electronically Signed   By: Narda Rutherford M.D.   On: 12/29/2020 15:08   DG C-Arm 1-60 Min  Result Date: 12/29/2020 CLINICAL DATA:  Left ankle external fixation. EXAM: LEFT ANKLE - 2 VIEW; DG C-ARM 1-60 MIN COMPARISON:  Radiograph 12/27/2020 FINDINGS: Four fluoroscopic spot views obtained in the operating room during fixator placement. Pins in the tibia and calcaneus. Improved alignment of distal tibia and fibular fractures. Fluoroscopy time and dose not provided by technologist. IMPRESSION: Intraoperative fluoroscopy during external fixator placement. Electronically Signed   By: Narda Rutherford M.D.   On: 12/29/2020 15:08   [place] OR NERVE BLOCK-IMAGE ONLY [facility])  Result Date: 12/29/2020 There is no interpretation for this exam.  This order is for images obtained during a surgical procedure.  Please See "Surgeries" Tab for more information regarding the procedure.    Scheduled Meds: . [START ON 12/31/2020] amoxicillin-clavulanate  1 tablet Oral Q12H  .  atorvastatin  80 mg Oral QHS  . escitalopram  20 mg Oral Daily  . feeding supplement  237 mL Oral BID BM  . insulin aspart  0-5 Units Subcutaneous QHS  . insulin aspart  0-9 Units Subcutaneous TID WC  . levothyroxine  25 mcg Oral Q0600  . sodium zirconium cyclosilicate  10 g Oral Daily    Assessment/Plan:  1. Displaced left bimalleolar ankle fracture.  Patient had surgery to place external hardware.  Patient is nonweightbearing to left lower extremity.  Physical therapy recommending rehab. 2. Bilateral lower extremity cat scratches.  We will give Zithromax,  Short course. 3. Hyperkalemia will dose Lokelma daily 4. Acute kidney injury on chronic kidney disease stage II.  Patient's creatinine improved from 1.74 down to 1.31. 5. Type 2 diabetes mellitus with hypoglycemia at home, hyperlipidemia.  Hemoglobin A1c 6.1. 6. Depression on Lexapro 7. Hypothyroidism unspecified on  levothyroxine 8. Weakness.  Physical therapy recommending rehab        Code Status:     Code Status Orders  (From admission, onward)         Start     Ordered   12/27/20 2012  Full code  Continuous        12/27/20 2013        Code Status History    This patient has a current code status but no historical code status.   Advance Care Planning Activity     Family Communication: Unable to reach son on phone number in the computer.  Left message for patient's sister. Disposition Plan: Status is: Inpatient  Dispo: The patient is from: Home              Anticipated d/c is to: Rehab.  Patient unsafe disposition home with external hardware living alone with cats that are scratching up his legs.              Patient currently unsafe discharge at this point.   Difficult to place patient.  No  Consultants:  Orthopedic surgery  Antibiotics:  Zithromax to cover cat scratch.  Time spent: 28 minutes  [employer]

## 2020-12-30 NOTE — Plan of Care (Signed)
Motivation is lacking. Periods of confusion though he can answer all questions for orientation. Will continue to try to help motivate

## 2020-12-30 NOTE — Evaluation (Signed)
Physical Therapy Evaluation Patient Details Name: Brad Santiago MRN: [id number] DOB: 11-13-1940 Today's Date: 12/30/2020   History of Present Illness  Brad Santiago is a 80 y.o. male with medical history significant for hypertension, non-insulin-dependent diabetes mellitus, hypothyroid, hyperlipidemia, peripheral neuropathy, presents to the emergency department for chief concerns of passing out. Patient states he was sitting down on his chair when he felt weak in the last thing he remembered was seeing the coffee table about to catch his chin as he fell forward. He woke up on the floor.  He states he does not remember anything else.  Patient sustained a bimalleolar L ankle fracture. S/P external fixation.  Clinical Impression  Patient received in bed, he is oriented and pleasant. Likes to joke around. He is agreeable to PT session. Patient very talkative and requires re-direction at times to continue moving. He requires min assist with bed mobility, mainly to move L LE. Patient requires mod +2 for sit to stand from elevated bed. He requires constant cues to keep L LE off floor with transfer and standing. Patient will continue to benefit from skilled PT while here to improve mobility and strength.      Follow Up Recommendations SNF;Supervision/Assistance - 24 hour    Equipment Recommendations  None recommended by PT    Recommendations for Other Services       Precautions / Restrictions Precautions Precautions: Fall Required Braces or Orthoses: Other Brace Other Brace: external fixator Restrictions Weight Bearing Restrictions: Yes LLE Weight Bearing: Non weight bearing      Mobility  Bed Mobility Overal bed mobility: Needs Assistance Bed Mobility: Supine to Sit;Sit to Supine     Supine to sit: Min assist Sit to supine: Min assist   General bed mobility comments: patient requires assistance to move L LE on and off bed.    Transfers Overall transfer level: Needs  assistance Equipment used: Rolling walker (2 wheeled) Transfers: Sit to/from Stand Sit to Stand: Mod assist;+2 physical assistance;From elevated surface         General transfer comment: Patient requires mod assist. Constant instruction to keep left LE off floor. Unable/unsafe to atempt transfer at this time.  Ambulation/Gait             General Gait Details: unable  Stairs            Wheelchair Mobility    Modified Rankin (Stroke Patients Only)       Balance Overall balance assessment: Needs assistance Sitting-balance support: Feet supported;Bilateral upper extremity supported Sitting balance-Leahy Scale: Good     Standing balance support: Bilateral upper extremity supported;During functional activity Standing balance-Leahy Scale: Poor Standing balance comment: Patient able to static stand with +2 min assist and RW. Cues to keep left LE NWB.                             Pertinent Vitals/Pain Pain Assessment: No/denies pain    Home Living Family/patient expects to be discharged to:: Private residence Living Arrangements: Alone Available Help at Discharge: Family;Available PRN/intermittently Type of Home: House Home Access: Ramped entrance     Home Layout: One level Home Equipment: Walker - 2 wheels;Wheelchair - manual;Cane - single point Additional Comments: patient has one step to get into bathroom.    Prior Function Level of Independence: Independent with assistive device(s)               Hand Dominance  Extremity/Trunk Assessment   Upper Extremity Assessment Upper Extremity Assessment: Defer to OT evaluation    Lower Extremity Assessment Lower Extremity Assessment: LLE deficits/detail LLE Deficits / Details: patient has no feeling in his left foot, cannot move toes at all LLE Sensation: history of peripheral neuropathy LLE Coordination: decreased fine motor;decreased gross motor    Cervical / Trunk  Assessment Cervical / Trunk Assessment: Normal  Communication   Communication: No difficulties  Cognition Arousal/Alertness: Awake/alert Behavior During Therapy: WFL for tasks assessed/performed Overall Cognitive Status: Within Functional Limits for tasks assessed                                        General Comments      Exercises     Assessment/Plan    PT Assessment Patient needs continued PT services  PT Problem List Decreased strength;Decreased activity tolerance;Decreased balance;Decreased mobility;Decreased knowledge of use of DME;Decreased knowledge of precautions;Decreased safety awareness       PT Treatment Interventions DME instruction;Therapeutic exercise;Gait training;Balance training;Functional mobility training;Therapeutic activities;Patient/family education    PT Goals (Current goals can be found in the Care Plan section)  Acute Rehab PT Goals Patient Stated Goal: to have cats brought here PT Goal Formulation: With patient Time For Goal Achievement: 01/12/21 Potential to Achieve Goals: Fair    Frequency BID   Barriers to discharge Decreased caregiver support;Inaccessible home environment      Co-evaluation PT/OT/SLP Co-Evaluation/Treatment: Yes Reason for Co-Treatment: To address functional/ADL transfers;For patient/therapist safety PT goals addressed during session: Mobility/safety with mobility;Balance;Proper use of DME         AM-PAC PT "6 Clicks" Mobility  Outcome Measure Help needed turning from your back to your side while in a flat bed without using bedrails?: A Lot Help needed moving from lying on your back to sitting on the side of a flat bed without using bedrails?: A Lot Help needed moving to and from a bed to a chair (including a wheelchair)?: Total Help needed standing up from a chair using your arms (e.g., wheelchair or bedside chair)?: A Lot Help needed to walk in hospital room?: Total Help needed climbing 3-5 steps  with a railing? : Total 6 Click Score: 9    End of Session Equipment Utilized During Treatment: Gait belt Activity Tolerance: Patient tolerated treatment well Patient left: in bed;with call bell/phone within reach;with bed alarm set Nurse Communication: Mobility status PT Visit Diagnosis: Other abnormalities of gait and mobility (R26.89);Difficulty in walking, not elsewhere classified (R26.2);History of falling (Z91.81)    Time: 1000-1035 PT Time Calculation (min) (ACUTE ONLY): 35 min   Charges:   PT Evaluation $PT Eval Moderate Complexity: 1 Mod          Lettie Czarnecki, PT, GCS 12/30/20,10:57 AM

## 2020-12-30 NOTE — Progress Notes (Signed)
Physical Therapy Treatment Patient Details Name: Brad Santiago MRN: [id number] DOB: 1940/11/18 Today's Date: 12/30/2020    History of Present Illness Brad Santiago is a 80 y.o. male with medical history significant for hypertension, non-insulin-dependent diabetes mellitus, hypothyroid, hyperlipidemia, peripheral neuropathy, presents to the emergency department for chief concerns of passing out. Patient states he was sitting down on his chair when he felt weak in the last thing he remembered was seeing the coffee table about to catch his chin as he fell forward. He woke up on the floor.  He states he does not remember anything else.  Patient sustained a bimalleolar L ankle fracture. S/P external fixation.    PT Comments    Patient received in bed, he is agreeable to LE strengthening exercises this pm. Able to move L LE better this pm for exercise without assistance, just support.  He will continue to benefit from skilled PT while here to improve functional independence and strength.     Follow Up Recommendations  SNF;Supervision/Assistance - 24 hour     Equipment Recommendations  None recommended by PT    Recommendations for Other Services       Precautions / Restrictions Precautions Precautions: Fall Required Braces or Orthoses: Other Brace Other Brace: external fixator Restrictions Weight Bearing Restrictions: Yes LLE Weight Bearing: Non weight bearing    Mobility  Bed Mobility               General bed mobility comments: not performed this pm    Transfers                 General transfer comment: not performed this pm  Ambulation/Gait                 Stairs             Wheelchair Mobility    Modified Rankin (Stroke Patients Only)       Balance                                            Cognition Arousal/Alertness: Awake/alert Behavior During Therapy: WFL for tasks assessed/performed Overall Cognitive Status: Within  Functional Limits for tasks assessed                                        Exercises Other Exercises Other Exercises: B LE exercises: R LE: ap, heel slides, SLR, hip abd/add x 10 reps.  L LE heel slides, hip abd/add, SLR x 5-10 with min guard assist    General Comments        Pertinent Vitals/Pain Pain Assessment: No/denies pain    Home Living                      Prior Function            PT Goals (current goals can now be found in the care plan section) Acute Rehab PT Goals Patient Stated Goal: to have cats brought here PT Goal Formulation: With patient Time For Goal Achievement: 01/12/21 Potential to Achieve Goals: Fair Progress towards PT goals: Progressing toward goals    Frequency    7X/week      PT Plan Frequency needs to be updated    Co-evaluation  AM-PAC PT "6 Clicks" Mobility   Outcome Measure  Help needed turning from your back to your side while in a flat bed without using bedrails?: A Lot Help needed moving from lying on your back to sitting on the side of a flat bed without using bedrails?: A Lot Help needed moving to and from a bed to a chair (including a wheelchair)?: Total Help needed standing up from a chair using your arms (e.g., wheelchair or bedside chair)?: A Lot Help needed to walk in hospital room?: Total Help needed climbing 3-5 steps with a railing? : Total 6 Click Score: 9    End of Session   Activity Tolerance: Patient tolerated treatment well Patient left: in bed;with call bell/phone within reach;with bed alarm set Nurse Communication: Mobility status PT Visit Diagnosis: Other abnormalities of gait and mobility (R26.89);Difficulty in walking, not elsewhere classified (R26.2);History of falling (Z91.81)     Time: 0998-3382 PT Time Calculation (min) (ACUTE ONLY): 12 min  Charges:  $Therapeutic Exercise: 8-22 mins                     [employer], PT, GCS 12/30/20,3:43  PM

## 2020-12-30 NOTE — Progress Notes (Signed)
Subjective:  Patient reports pain as well controlled. No other complaints.  Objective:   VITALS:   Vitals:   12/29/20 1924 12/30/20 0445 12/30/20 0851 12/30/20 1554  BP: (!) 124/58 (!) 113/50 (!) 114/49 (!) 114/54  Pulse: 60 (!) 59 70 64  Resp: 17 16 17 16   Temp: 99 F (37.2 C) 98 F (36.7 C) 97.9 F (36.6 C) 98.6 F (37 C)  TempSrc: Oral Oral    SpO2: 94% 92% 97% 94%  Weight:      Height:        PHYSICAL EXAM:  General: alert, comfortable  LLE: ex-fix in place, pin sites are clean, dressing over fracture blisters- most are decompressed    LABS  Results for orders placed or performed during the hospital encounter of 12/27/20 (from the past 24 hour(s))  Glucose, capillary     Status: Abnormal   Collection Time: 12/29/20  4:57 PM  Result Value Ref Range   Glucose-Capillary 166 (H) 70 - 99 mg/dL   Comment 1 Notify RN    Comment 2 Document in Chart   Glucose, capillary     Status: Abnormal   Collection Time: 12/29/20  8:42 PM  Result Value Ref Range   Glucose-Capillary 261 (H) 70 - 99 mg/dL  CBC     Status: Abnormal   Collection Time: 12/30/20  5:31 AM  Result Value Ref Range   WBC 10.4 4.0 - 10.5 K/uL   RBC 2.81 (L) 4.22 - 5.81 MIL/uL   Hemoglobin 8.9 (L) 13.0 - 17.0 g/dL   HCT 01/01/21 (L) 00.8 - 67.6 %   MCV 97.2 80.0 - 100.0 fL   MCH 31.7 26.0 - 34.0 pg   MCHC 32.6 30.0 - 36.0 g/dL   RDW 19.5 09.3 - 26.7 %   Platelets 204 150 - 400 K/uL   nRBC 0.0 0.0 - 0.2 %  Basic metabolic panel     Status: Abnormal   Collection Time: 12/30/20  5:31 AM  Result Value Ref Range   Sodium 134 (L) 135 - 145 mmol/L   Potassium 5.1 3.5 - 5.1 mmol/L   Chloride 106 98 - 111 mmol/L   CO2 21 (L) 22 - 32 mmol/L   Glucose, Bld 205 (H) 70 - 99 mg/dL   BUN 37 (H) 8 - 23 mg/dL   Creatinine, Ser 01/01/21 (H) 0.61 - 1.24 mg/dL   Calcium 7.8 (L) 8.9 - 10.3 mg/dL   GFR, Estimated 55 (L) >60 mL/min   Anion gap 7 5 - 15  Glucose, capillary     Status: Abnormal   Collection Time: 12/30/20   7:50 AM  Result Value Ref Range   Glucose-Capillary 192 (H) 70 - 99 mg/dL  Glucose, capillary     Status: Abnormal   Collection Time: 12/30/20 11:38 AM  Result Value Ref Range   Glucose-Capillary 227 (H) 70 - 99 mg/dL    DG Ankle 2 Views Left  Result Date: 12/29/2020 CLINICAL DATA:  Left ankle external fixation. EXAM: LEFT ANKLE - 2 VIEW; DG C-ARM 1-60 MIN COMPARISON:  Radiograph 12/27/2020 FINDINGS: Four fluoroscopic spot views obtained in the operating room during fixator placement. Pins in the tibia and calcaneus. Improved alignment of distal tibia and fibular fractures. Fluoroscopy time and dose not provided by technologist. IMPRESSION: Intraoperative fluoroscopy during external fixator placement. Electronically Signed   By: 12/29/2020 M.D.   On: 12/29/2020 15:08   DG C-Arm 1-60 Min  Result Date: 12/29/2020 CLINICAL DATA:  Left ankle external  fixation. EXAM: LEFT ANKLE - 2 VIEW; DG C-ARM 1-60 MIN COMPARISON:  Radiograph 12/27/2020 FINDINGS: Four fluoroscopic spot views obtained in the operating room during fixator placement. Pins in the tibia and calcaneus. Improved alignment of distal tibia and fibular fractures. Fluoroscopy time and dose not provided by technologist. IMPRESSION: Intraoperative fluoroscopy during external fixator placement. Electronically Signed   By: Narda Rutherford M.D.   On: 12/29/2020 15:08   DG C-Arm 1-60 Min  Result Date: 12/29/2020 CLINICAL DATA:  Left ankle external fixation. EXAM: LEFT ANKLE - 2 VIEW; DG C-ARM 1-60 MIN COMPARISON:  Radiograph 12/27/2020 FINDINGS: Four fluoroscopic spot views obtained in the operating room during fixator placement. Pins in the tibia and calcaneus. Improved alignment of distal tibia and fibular fractures. Fluoroscopy time and dose not provided by technologist. IMPRESSION: Intraoperative fluoroscopy during external fixator placement. Electronically Signed   By: Narda Rutherford M.D.   On: 12/29/2020 15:08   [place] OR NERVE  BLOCK-IMAGE ONLY [facility])  Result Date: 12/29/2020 There is no interpretation for this exam.  This order is for images obtained during a surgical procedure.  Please See "Surgeries" Tab for more information regarding the procedure.    Assessment/Plan: 1 Day Post-Op s/p L ankle ex-fix placement 12/29/20  - PT/OT: NWB LLE - Ok to start DVT ppx: ASA 325mg  - Will place wound care consult to assist with areas of fracture blistering/epidermolysis on left tibia - Pin site care with 1:1 saline/hydrogen peroxide starting POD#2 - Discussed with patient that definitive fixation will likely be a minimum of 7-10 days pending improvement in swelling and healing of fracture blisters along medial and lateral aspect of the leg  , MD 12/30/2020, 4:41 PM

## 2020-12-30 NOTE — [place] FL2 (Signed)
[place] MEDICAID FL2 LEVEL OF CARE SCREENING TOOL     IDENTIFICATION  Patient Name: Brad Santiago Birthdate: 19-Jan-1941 Sex: male Admission Date (Current Location): 12/27/2020  [place] and [place] Number:  Chiropodist and Address:  [facility], [address] [place], [place] [address]      Provider Number: [id number]  Attending Physician Name and Address:  Alford Highland, MD  Relative Name and Phone Number:  Dennard Nip SOn [phone]    Current Level of Care: Hospital Recommended Level of Care: Skilled Nursing Facility Prior Approval Number:    Date Approved/Denied:   PASRR Number: [id number]  Discharge Plan: SNF    Current Diagnoses: Patient Active Problem List   Diagnosis Date Noted  . Acute metabolic encephalopathy 12/28/2020  . Closed left ankle fracture 12/27/2020  . Diabetes mellitus type 2, noninsulin dependent (HCC) 12/27/2020  . Essential hypertension 12/27/2020  . Hyperlipidemia 12/27/2020  . Acquired hypothyroidism 12/27/2020  . AKI (acute kidney injury) (HCC) 12/27/2020    Orientation RESPIRATION BLADDER Height & Weight     Self,Time,Situation,Place    Continent Weight: 104.3 kg Height:  6' (182.9 cm)  BEHAVIORAL SYMPTOMS/MOOD NEUROLOGICAL BOWEL NUTRITION STATUS      Continent Diet (Carb Modified)  AMBULATORY STATUS COMMUNICATION OF NEEDS Skin   Extensive Assist Verbally Surgical wounds                       Personal Care Assistance Level of Assistance  Bathing,Dressing Bathing Assistance: Limited assistance   Dressing Assistance: Limited assistance     Functional Limitations Info             SPECIAL CARE FACTORS FREQUENCY  PT (By licensed PT)     PT Frequency: 5 times per week              Contractures Contractures Info: Not present    Additional Factors Info  Allergies,Code Status Code Status Info: full code Allergies Info: Sulfa Antibiotics           Current Medications  (12/30/2020):  This is the current hospital active medication list Current Facility-Administered Medications  Medication Dose Route Frequency Provider Last Rate Last Admin  . acetaminophen (TYLENOL) tablet 650 mg  650 mg Oral Q6H PRN Ross Marcus, MD   650 mg at 12/27/20 2146  . [START ON 12/31/2020] amoxicillin-clavulanate (AUGMENTIN) 875-125 MG per tablet 1 tablet  1 tablet Oral Q12H Wieting, Richard, MD      . atorvastatin (LIPITOR) tablet 80 mg  80 mg Oral QHS Tressie Ellis, RPH   80 mg at 12/29/20 2143  . ceFAZolin (ANCEF) 1 g in sodium chloride 0.9 % 100 mL IVPB  1 g Intravenous Garnett Farm, MD   1 g at 12/30/20 0542  . dextrose 50 % solution 50 mL  1 ampule Intravenous PRN Ross Marcus, MD      . escitalopram Judye Bos) tablet 20 mg  20 mg Oral Daily Ross Marcus, MD   20 mg at 12/30/20 0751  . feeding supplement (ENSURE ENLIVE / ENSURE PLUS) liquid 237 mL  237 mL Oral BID BM Ross Marcus, MD   237 mL at 12/30/20 1239  . hydrALAZINE (APRESOLINE) tablet 25 mg  25 mg Oral Q8H PRN Ross Marcus, MD      . insulin aspart (novoLOG) injection 0-5 Units  0-5 Units Subcutaneous QHS Ross Marcus, MD   3 Units at 12/29/20 2143  . insulin aspart (novoLOG) injection 0-9 Units  0-9 Units Subcutaneous TID WC Ross Marcus, MD   3 Units at 12/30/20 1238  . levothyroxine (SYNTHROID) tablet 25 mcg  25 mcg Oral Q0600 Ross Marcus, MD   25 mcg at 12/30/20 0541  . morphine 2 MG/ML injection 1-2 mg  1-2 mg Intravenous Q2H PRN Ross Marcus, MD   2 mg at 12/29/20 1005  . ondansetron (ZOFRAN) injection 4 mg  4 mg Intravenous Q6H PRN Ross Marcus, MD   4 mg at 12/28/20 1217  . oxyCODONE-acetaminophen (PERCOCET/ROXICET) 5-325 MG per tablet 1 tablet  1 tablet Oral Q4H PRN Alford Highland, MD   1 tablet at 12/30/20 0751  . sodium zirconium cyclosilicate (LOKELMA) packet 10 g  10 g Oral Daily Alford Highland, MD   10 g at 12/30/20 2446     Discharge  Medications: Please see discharge summary for a list of discharge medications.  Relevant Imaging Results:  Relevant Lab Results:   Additional Information SS# [id number]  Barrie Dunker, RN

## 2020-12-31 DIAGNOSIS — E875 Hyperkalemia: Secondary | ICD-10-CM | POA: Diagnosis not present

## 2020-12-31 DIAGNOSIS — R531 Weakness: Secondary | ICD-10-CM

## 2020-12-31 DIAGNOSIS — S82892A Other fracture of left lower leg, initial encounter for closed fracture: Secondary | ICD-10-CM | POA: Diagnosis not present

## 2020-12-31 DIAGNOSIS — E1169 Type 2 diabetes mellitus with other specified complication: Secondary | ICD-10-CM | POA: Diagnosis not present

## 2020-12-31 LAB — BASIC METABOLIC PANEL
Anion gap: 7 (ref 5–15)
BUN: 35 mg/dL — ABNORMAL HIGH (ref 8–23)
CO2: 23 mmol/L (ref 22–32)
Calcium: 8.1 mg/dL — ABNORMAL LOW (ref 8.9–10.3)
Chloride: 109 mmol/L (ref 98–111)
Creatinine, Ser: 1.31 mg/dL — ABNORMAL HIGH (ref 0.61–1.24)
GFR, Estimated: 55 mL/min — ABNORMAL LOW (ref >60)
Glucose, Bld: 136 mg/dL — ABNORMAL HIGH (ref 70–99)
Potassium: 4.5 mmol/L (ref 3.5–5.1)
Sodium: 139 mmol/L (ref 135–145)

## 2020-12-31 LAB — CBC
HCT: 26.4 % — ABNORMAL LOW (ref 39.0–52.0)
Hemoglobin: 8.5 g/dL — ABNORMAL LOW (ref 13.0–17.0)
MCH: 31.5 pg (ref 26.0–34.0)
MCHC: 32.2 g/dL (ref 30.0–36.0)
MCV: 97.8 fL (ref 80.0–100.0)
Platelets: 229 10*3/uL (ref 150–400)
RBC: 2.7 MIL/uL — ABNORMAL LOW (ref 4.22–5.81)
RDW: 13.6 % (ref 11.5–15.5)
WBC: 8.7 10*3/uL (ref 4.0–10.5)
nRBC: 0 % (ref 0.0–0.2)

## 2020-12-31 LAB — GLUCOSE, CAPILLARY
Glucose-Capillary: 138 mg/dL — ABNORMAL HIGH (ref 70–99)
Glucose-Capillary: 136 mg/dL — ABNORMAL HIGH (ref 70–99)
Glucose-Capillary: 132 mg/dL — ABNORMAL HIGH (ref 70–99)
Glucose-Capillary: 134 mg/dL — ABNORMAL HIGH (ref 70–99)

## 2020-12-31 NOTE — Care Management Important Message (Signed)
Important Message  Patient Details  Name: Brad Santiago MRN: [id number] Date of Birth: 01/29/1941   Medicare Important Message Given:  Yes     Olegario Messier A Antwone Capozzoli 12/31/2020, 10:52 AM

## 2020-12-31 NOTE — Progress Notes (Signed)
Subjective:  Patient reports pain as well controlled. He was able to sit at EOB with PT.  Objective:   VITALS:   Vitals:   12/30/20 2112 12/31/20 0338 12/31/20 0740 12/31/20 1135  BP: (!) 130/58 139/72 122/63 (!) 111/55  Pulse: 69 66 (!) 58 (!) 59  Resp: 18 18 18 17   Temp: 98 F (36.7 C) 97.8 F (36.6 C) 98.2 F (36.8 C) 98 F (36.7 C)  TempSrc:   Oral Oral  SpO2: 96% 96% 95% 95%  Weight:      Height:        PHYSICAL EXAM:  General: alert, comfortable  LLE: ex-fix in place, pin sites are clean, dressing over fracture blisters- most are decompressed  LABS  Results for orders placed or performed during the hospital encounter of 12/27/20 (from the past 24 hour(s))  Glucose, capillary     Status: Abnormal   Collection Time: 12/30/20  5:13 PM  Result Value Ref Range   Glucose-Capillary 147 (H) 70 - 99 mg/dL  Glucose, capillary     Status: Abnormal   Collection Time: 12/30/20  9:44 PM  Result Value Ref Range   Glucose-Capillary 190 (H) 70 - 99 mg/dL  CBC     Status: Abnormal   Collection Time: 12/31/20  5:43 AM  Result Value Ref Range   WBC 8.7 4.0 - 10.5 K/uL   RBC 2.70 (L) 4.22 - 5.81 MIL/uL   Hemoglobin 8.5 (L) 13.0 - 17.0 g/dL   HCT 01/02/21 (L) 05.3 - 97.6 %   MCV 97.8 80.0 - 100.0 fL   MCH 31.5 26.0 - 34.0 pg   MCHC 32.2 30.0 - 36.0 g/dL   RDW 73.4 19.3 - 79.0 %   Platelets 229 150 - 400 K/uL   nRBC 0.0 0.0 - 0.2 %  Basic metabolic panel     Status: Abnormal   Collection Time: 12/31/20  5:43 AM  Result Value Ref Range   Sodium 139 135 - 145 mmol/L   Potassium 4.5 3.5 - 5.1 mmol/L   Chloride 109 98 - 111 mmol/L   CO2 23 22 - 32 mmol/L   Glucose, Bld 136 (H) 70 - 99 mg/dL   BUN 35 (H) 8 - 23 mg/dL   Creatinine, Ser 01/02/21 (H) 0.61 - 1.24 mg/dL   Calcium 8.1 (L) 8.9 - 10.3 mg/dL   GFR, Estimated 55 (L) >60 mL/min   Anion gap 7 5 - 15  Glucose, capillary     Status: Abnormal   Collection Time: 12/31/20  7:51 AM  Result Value Ref Range   Glucose-Capillary  138 (H) 70 - 99 mg/dL  Glucose, capillary     Status: Abnormal   Collection Time: 12/31/20 11:37 AM  Result Value Ref Range   Glucose-Capillary 136 (H) 70 - 99 mg/dL    DG Ankle 2 Views Left  Result Date: 12/29/2020 CLINICAL DATA:  Left ankle external fixation. EXAM: LEFT ANKLE - 2 VIEW; DG C-ARM 1-60 MIN COMPARISON:  Radiograph 12/27/2020 FINDINGS: Four fluoroscopic spot views obtained in the operating room during fixator placement. Pins in the tibia and calcaneus. Improved alignment of distal tibia and fibular fractures. Fluoroscopy time and dose not provided by technologist. IMPRESSION: Intraoperative fluoroscopy during external fixator placement. Electronically Signed   By: 12/29/2020 M.D.   On: 12/29/2020 15:08   DG C-Arm 1-60 Min  Result Date: 12/29/2020 CLINICAL DATA:  Left ankle external fixation. EXAM: LEFT ANKLE - 2 VIEW; DG C-ARM 1-60 MIN COMPARISON:  Radiograph  12/27/2020 FINDINGS: Four fluoroscopic spot views obtained in the operating room during fixator placement. Pins in the tibia and calcaneus. Improved alignment of distal tibia and fibular fractures. Fluoroscopy time and dose not provided by technologist. IMPRESSION: Intraoperative fluoroscopy during external fixator placement. Electronically Signed   By: Narda Rutherford M.D.   On: 12/29/2020 15:08   DG C-Arm 1-60 Min  Result Date: 12/29/2020 CLINICAL DATA:  Left ankle external fixation. EXAM: LEFT ANKLE - 2 VIEW; DG C-ARM 1-60 MIN COMPARISON:  Radiograph 12/27/2020 FINDINGS: Four fluoroscopic spot views obtained in the operating room during fixator placement. Pins in the tibia and calcaneus. Improved alignment of distal tibia and fibular fractures. Fluoroscopy time and dose not provided by technologist. IMPRESSION: Intraoperative fluoroscopy during external fixator placement. Electronically Signed   By: Narda Rutherford M.D.   On: 12/29/2020 15:08    Assessment/Plan: 2 Days Post-Op s/p L ankle ex-fix placement  12/29/20  - PT/OT: NWB LLE - Continue DVT ppx: ASA 325mg  - Wound care consult to assist with areas of fracture blistering/epidermolysis on left tibia - Twice daily pin site care by nursing with 1:1 saline/hydrogen peroxide - Discussed with patient that definitive fixation will likely be a minimum of 7-10 days pending improvement in swelling and healing of fracture blisters along medial and lateral aspect of the leg - Discharge to SNF pending. Plan for f/u in clinic in 1 week.     , MD 12/31/2020, 12:38 PM

## 2020-12-31 NOTE — Progress Notes (Signed)
Dressing changed per wound care orders. Cleaned with ns, patted dry, covered nonadherant gauze, kerlix.

## 2020-12-31 NOTE — Progress Notes (Addendum)
Patient ID: Brad Santiago, male   DOB: 05-18-1941, 80 y.o.   MRN: [id number] [facility] PROGRESS NOTE  Davius Goudeau Blas WPV:[id number] DOB: 1941-06-21 DOA: 12/27/2020 PCP: Dorothey Baseman, MD  HPI/Subjective: Patient has some pain in his ankle.  No further lower low sugars.  Cat scratches on his legs healing.  Objective: Vitals:   12/31/20 0740 12/31/20 1135  BP: 122/63 (!) 111/55  Pulse: (!) 58 (!) 59  Resp: 18 17  Temp: 98.2 F (36.8 C) 98 F (36.7 C)  SpO2: 95% 95%    Intake/Output Summary (Last 24 hours) at 12/31/2020 1419 Last data filed at 12/31/2020 0725 Gross per 24 hour  Intake 440 ml  Output 1050 ml  Net -610 ml   Filed Weights   12/27/20 1751  Weight: 104.3 kg    ROS: Review of Systems  Respiratory: Negative for shortness of breath.   Cardiovascular: Negative for chest pain.  Gastrointestinal: Negative for abdominal pain, nausea and vomiting.  Musculoskeletal: Positive for joint pain.   Exam: Physical Exam HENT:     Head: Normocephalic.     Mouth/Throat:     Pharynx: No oropharyngeal exudate.  Eyes:     General: Lids are normal.     Conjunctiva/sclera: Conjunctivae normal.     Pupils: Pupils are equal, round, and reactive to light.  Cardiovascular:     Rate and Rhythm: Normal rate and regular rhythm.     Heart sounds: Normal heart sounds, S1 normal and S2 normal.  Pulmonary:     Breath sounds: Normal breath sounds. No decreased breath sounds, wheezing, rhonchi or rales.  Abdominal:     Palpations: Abdomen is soft.     Tenderness: There is no abdominal tenderness.  Musculoskeletal:     Right lower leg: Swelling present.     Left lower leg: Swelling present.  Skin:    General: Skin is warm.     Comments: Cat scratches bilateral lower extremities worse left than right too many to count.  Neurological:     Mental Status: He is alert and oriented to person, place, and time.       Data Reviewed: Basic Metabolic Panel: Recent Labs  Lab  12/27/20 1759 12/28/20 0348 12/29/20 0525 12/30/20 0531 12/31/20 0543  NA 137 138 138 134* 139  K 4.8 5.2* 5.3* 5.1 4.5  CL 108 109 110 106 109  CO2 24 23 22  21* 23  GLUCOSE 39* 99 168* 205* 136*  BUN 38* 40* 41* 37* 35*  CREATININE 1.72* 1.74* 1.67* 1.31* 1.31*  CALCIUM 8.5* 8.6* 8.2* 7.8* 8.1*   Liver Function Tests: Recent Labs  Lab 12/27/20 1759  AST 24  ALT 12  ALKPHOS 78  BILITOT 0.6  PROT 6.8  ALBUMIN 3.7   CBC: Recent Labs  Lab 12/27/20 1759 12/28/20 0348 12/29/20 0525 12/30/20 0531 12/31/20 0543  WBC 6.5 7.6 8.4 10.4 8.7  HGB 10.2* 9.4* 9.0* 8.9* 8.5*  HCT 32.4* 29.2* 27.5* 27.3* 26.4*  MCV 99.4 98.3 99.3 97.2 97.8  PLT 246 210 194 204 229    CBG: Recent Labs  Lab 12/30/20 1138 12/30/20 1713 12/30/20 2144 12/31/20 0751 12/31/20 1137  GLUCAP 227* 147* 190* 138* 136*    Recent Results (from the past 240 hour(s))  SARS CORONAVIRUS 2 (TAT 6-24 HRS) Nasopharyngeal Nasopharyngeal Swab     Status: None   Collection Time: 12/27/20  9:16 PM   Specimen: Nasopharyngeal Swab  Result Value Ref Range Status   SARS Coronavirus 2 NEGATIVE  NEGATIVE Final    Comment: (NOTE) SARS-CoV-2 target nucleic acids are NOT DETECTED.  The SARS-CoV-2 RNA is generally detectable in upper and lower respiratory specimens during the acute phase of infection. Negative results do not preclude SARS-CoV-2 infection, do not rule out co-infections with other pathogens, and should not be used as the sole basis for treatment or other patient management decisions. Negative results must be combined with clinical observations, patient history, and epidemiological information. The expected result is Negative.  Fact Sheet for Patients: HairSlick.no  Fact Sheet for Healthcare Providers: quierodirigir.com  This test is not yet approved or cleared by the [place] FDA and  has been authorized for detection and/or diagnosis  of SARS-CoV-2 by FDA under an Emergency Use Authorization (EUA). This EUA will remain  in effect (meaning this test can be used) for the duration of the COVID-19 declaration under Se ction 564(b)(1) of the Act, 21 U.S.C. section 360bbb-3(b)(1), unless the authorization is terminated or revoked sooner.  Performed at [facility], [address] [place], [place] [address]   Surgical PCR screen     Status: None   Collection Time: 12/29/20  7:45 AM   Specimen: Nasal Mucosa; Nasal Swab  Result Value Ref Range Status   MRSA, PCR NEGATIVE NEGATIVE Final   Staphylococcus aureus NEGATIVE NEGATIVE Final    Comment: (NOTE) The Xpert SA Assay (FDA approved for NASAL specimens in patients 85 years of age and older), is one component of a comprehensive surveillance program. It is not intended to diagnose infection nor to guide or monitor treatment. Performed at [facility], [address] [place], [place] [address]      Studies: DG Ankle 2 Views Left  Result Date: 12/29/2020 CLINICAL DATA:  Left ankle external fixation. EXAM: LEFT ANKLE - 2 VIEW; DG C-ARM 1-60 MIN COMPARISON:  Radiograph 12/27/2020 FINDINGS: Four fluoroscopic spot views obtained in the operating room during fixator placement. Pins in the tibia and calcaneus. Improved alignment of distal tibia and fibular fractures. Fluoroscopy time and dose not provided by technologist. IMPRESSION: Intraoperative fluoroscopy during external fixator placement. Electronically Signed   By: Narda Rutherford M.D.   On: 12/29/2020 15:08   DG C-Arm 1-60 Min  Result Date: 12/29/2020 CLINICAL DATA:  Left ankle external fixation. EXAM: LEFT ANKLE - 2 VIEW; DG C-ARM 1-60 MIN COMPARISON:  Radiograph 12/27/2020 FINDINGS: Four fluoroscopic spot views obtained in the operating room during fixator placement. Pins in the tibia and calcaneus. Improved alignment of distal tibia and fibular fractures. Fluoroscopy time and dose not provided by  technologist. IMPRESSION: Intraoperative fluoroscopy during external fixator placement. Electronically Signed   By: Narda Rutherford M.D.   On: 12/29/2020 15:08   DG C-Arm 1-60 Min  Result Date: 12/29/2020 CLINICAL DATA:  Left ankle external fixation. EXAM: LEFT ANKLE - 2 VIEW; DG C-ARM 1-60 MIN COMPARISON:  Radiograph 12/27/2020 FINDINGS: Four fluoroscopic spot views obtained in the operating room during fixator placement. Pins in the tibia and calcaneus. Improved alignment of distal tibia and fibular fractures. Fluoroscopy time and dose not provided by technologist. IMPRESSION: Intraoperative fluoroscopy during external fixator placement. Electronically Signed   By: Narda Rutherford M.D.   On: 12/29/2020 15:08    Scheduled Meds: . atorvastatin  80 mg Oral QHS  . azithromycin  250 mg Oral Daily  . escitalopram  20 mg Oral Daily  . feeding supplement  237 mL Oral BID BM  . insulin aspart  0-5 Units Subcutaneous QHS  . insulin  aspart  0-9 Units Subcutaneous TID WC  . levothyroxine  25 mcg Oral Q0600    Assessment/Plan:  1. Displaced left bimalleolar ankle fracture.  Patient had surgery and external hardware was placed for immobilization.  Patient nonweightbearing to the left lower extremity.  Physical therapy recommending rehab.  Orthopedic surgery wants to wait 7 to 10 days for skin to heal before another operative procedure.  Transitional care team looking into rehab facilities. 2. Bilateral lower extremity cat scratches.  Finish up short course of Zithromax. 3. Hyperkalemia improved.  Get rid of Lokelma. 4. Acute kidney injury on chronic kidney disease stage II.  Creatinine was elevated at 1.74 on presentation down to 1.31. 5. Type 2 diabetes mellitus with hyperlipidemia.  Patient did have hypoglycemia at home.  Hemoglobin A1c 6.1.  Likely will not need glipizide at home.  On sliding scale while here.  Likely can go back on some of oral medications upon disposition. 6. Hypothyroidism  unspecified on levothyroxine 7. Depression on Lexapro 8. Weakness.  Physical therapy recommends rehab        Code Status:     Code Status Orders  (From admission, onward)         Start     Ordered   12/27/20 2012  Full code  Continuous        12/27/20 2013        Code Status History    This patient has a current code status but no historical code status.   Advance Care Planning Activity     Family Communication: Spoke with son on the phone Disposition Plan: Status is: Inpatient  Dispo: The patient is from: Home              Anticipated d/c is to: Rehab.  Awaiting insurance authorization              Patient currently will have to come back to the hospital for surgery at some point   Difficult to place patient.  No.  Consultants:  Orthopedic surgery  Antibiotics:  Zithromax  Time spent: 28 minutes  Mikal [employer]

## 2020-12-31 NOTE — TOC Progression Note (Signed)
Transition of Care [facility]) - Progression Note    Patient Details  Name: Brad Santiago MRN: [id number] Date of Birth: Sep 15, 1940  Transition of Care [facility]) CM/SW [place], RN Phone Number: 12/31/2020, 10:03 AM  Clinical Narrative:   Met with the patient at the bedside, reviewed the bed choices with him, He agrees and accepts the bed offer from [facility], I notified Isaias Cowman, Awaiting ins approval         Expected Discharge Plan and Services                                                 Social Determinants of Health ([place]) Interventions    Readmission Risk Interventions No flowsheet data found.

## 2020-12-31 NOTE — TOC Progression Note (Signed)
Transition of Care [facility]) - Progression Note    Patient Details  Name: Brad Santiago MRN: [id number] Date of Birth: [birth year]  Transition of Care [facility]) CM/SW Contact  Barrie Dunker, RN Phone Number: 12/31/2020, 10:14 AM  Clinical Narrative:   Barrie Dunker [facility] to inquire if they manage the patient, they do not, he is regular [employer], I notified [facility] and requested that they start the insurance auth process, DC awaiting approval         Expected Discharge Plan and Services                                                 Social Determinants of Health (SDOH) Interventions    Readmission Risk Interventions No flowsheet data found.

## 2020-12-31 NOTE — Progress Notes (Signed)
Physical Therapy Treatment Patient Details Name: Brad Santiago MRN: [id number] DOB: 1940-12-11 Today's Date: 12/31/2020    History of Present Illness Brad Santiago is a 80 y.o. male with medical history significant for hypertension, non-insulin-dependent diabetes mellitus, hypothyroid, hyperlipidemia, peripheral neuropathy, presents to the emergency department for chief concerns of passing out. Patient states he was sitting down on his chair when he felt weak in the last thing he remembered was seeing the coffee table about to catch his chin as he fell forward. He woke up on the floor.  He states he does not remember anything else.  Patient sustained a bimalleolar L ankle fracture. S/P external fixation.    PT Comments    Patient received in bed, he agrees to PT session. Wants to go home until he has next surgery had discussion that he would not be safe at home and needs to go to rehab. Patient in agreement. He requires min assist for supine to sit. Mod/max assist for sit to stand from elevated bed. Min assist to keep left LE off floor with transfer and standing. He was able to stand briefly with mod assist and RW, but then begins to put weight through L LE and is returned to sitting. Patient requires min guard for returning to supine. Patient will continue to benefit from skilled PT while here to improve functional independence and strength.     Follow Up Recommendations  SNF;Supervision/Assistance - 24 hour     Equipment Recommendations  None recommended by PT    Recommendations for Other Services       Precautions / Restrictions Precautions Precautions: Fall Required Braces or Orthoses: Other Brace Other Brace: external fixator Restrictions Weight Bearing Restrictions: Yes LLE Weight Bearing: Non weight bearing    Mobility  Bed Mobility Overal bed mobility: Needs Assistance Bed Mobility: Supine to Sit     Supine to sit: Min assist Sit to supine: Supervision   General bed  mobility comments: increased time, min assist to raise trunk to seated position.    Transfers Overall transfer level: Needs assistance Equipment used: Rolling walker (2 wheeled) Transfers: Sit to/from Stand Sit to Stand: From elevated surface;Max assist         General transfer comment: increased time, assist for posterior support for lift off, mod/max +1 on side and min assist to keep left foot off floor.  Ambulation/Gait             General Gait Details: unable   Stairs             Wheelchair Mobility    Modified Rankin (Stroke Patients Only)       Balance Overall balance assessment: Needs assistance Sitting-balance support: Feet supported Sitting balance-Leahy Scale: Good     Standing balance support: Bilateral upper extremity supported;During functional activity Standing balance-Leahy Scale: Poor Standing balance comment: Patient able to static stand with mod +1 and min +1 to keep left foot off floor. Unable to straighten up this visit. Brief standing before he started putting weight through left LE, therefore returned to sitting                            Cognition Arousal/Alertness: Awake/alert Behavior During Therapy: WFL for tasks assessed/performed Overall Cognitive Status: Within Functional Limits for tasks assessed  General Comments: has a dry sense of humor.      Exercises Total Joint Exercises Ankle Circles/Pumps: AROM;Right;10 reps Heel Slides: AROM;Right;5 reps Hip ABduction/ADduction: AROM;Right;5 reps Straight Leg Raises: AROM;Both;5 reps    General Comments        Pertinent Vitals/Pain Pain Assessment: No/denies pain    Home Living                      Prior Function            PT Goals (current goals can now be found in the care plan section) Acute Rehab PT Goals Patient Stated Goal: to have cats brought here PT Goal Formulation: With patient Time For  Goal Achievement: 01/12/21 Potential to Achieve Goals: Fair Progress towards PT goals: Progressing toward goals    Frequency    7X/week      PT Plan Current plan remains appropriate    Co-evaluation              AM-PAC PT "6 Clicks" Mobility   Outcome Measure  Help needed turning from your back to your side while in a flat bed without using bedrails?: A Lot Help needed moving from lying on your back to sitting on the side of a flat bed without using bedrails?: A Lot Help needed moving to and from a bed to a chair (including a wheelchair)?: Total Help needed standing up from a chair using your arms (e.g., wheelchair or bedside chair)?: A Lot Help needed to walk in hospital room?: Total Help needed climbing 3-5 steps with a railing? : Total 6 Click Score: 9    End of Session Equipment Utilized During Treatment: Gait belt Activity Tolerance: Patient tolerated treatment well;Patient limited by fatigue Patient left: in bed;with call bell/phone within reach;with bed alarm set;with nursing/sitter in room Nurse Communication: Mobility status PT Visit Diagnosis: Other abnormalities of gait and mobility (R26.89);Muscle weakness (generalized) (M62.81)     Time: 5631-4970 PT Time Calculation (min) (ACUTE ONLY): 30 min  Charges:  $Therapeutic Exercise: 8-22 mins $Therapeutic Activity: 8-22 mins                     Brad Santiago, PT, GCS 12/31/20,10:26 AM

## 2020-12-31 NOTE — Consult Note (Signed)
WOC Nurse Consult Note: Reason for Consult: Consulted for input on care of intact, serum-fill blisters on the LLE by Orthopedic Surgeon, Dr. Ross Marcus Wound type: trauma vs sensitivity to Coban self adherent wrap that is currently in place to secure dressings Pressure Injury POA: N/A Measurement: Intact, serum filled blisters at the medial, central and lateral left LE and anterior left foot. Medial:  7cm x 5cm  Central: 3cm x 1.2cm Lateral: 4.5cm x 4cm Anterior Foot:  2cm x 2.5cm Wound bed: N/A Drainage (amount, consistency, odor) N/A Periwound: edematous Dressing procedure/placement/frequency: I am not certain that the serum filled blisters are not in part due to a sensitivity to the Coban self adherent wrap and will discontinue this product while implementing topical care recommendations aimed at drying the blisters.  Care will entail daily cleansing of the areas with gentle patting dry followed by covering the blisters with an antimicrobial nonadherent dressing (Xeroform, Lawson # 294). This will be covered with dry gauze and secured with a few turns of Kerlix roll gauze/paper tape.  No tape is to be placed on the skin.  As the right heel is at risk for pressure and/or shear force injury, I have requested a Prevalon pressure redistribution heel boot for that foot. A sacral silicone foam is placed to PI prevention to the sacrum.  Thank you for inviting me to consult and provide recommendations for the topical care of these areas for this nice gentleman.  WOC nursing team will not follow, but will remain available to this patient, the nursing and medical teams.  Please re-consult if needed. Thanks, Ladona Mow, MSN, RN, GNP, [facility] [place]  Pager# [phone]

## 2021-01-01 DIAGNOSIS — S82892A Other fracture of left lower leg, initial encounter for closed fracture: Secondary | ICD-10-CM | POA: Diagnosis not present

## 2021-01-01 DIAGNOSIS — E1169 Type 2 diabetes mellitus with other specified complication: Secondary | ICD-10-CM | POA: Diagnosis not present

## 2021-01-01 DIAGNOSIS — R531 Weakness: Secondary | ICD-10-CM | POA: Diagnosis not present

## 2021-01-01 DIAGNOSIS — R21 Rash and other nonspecific skin eruption: Secondary | ICD-10-CM | POA: Diagnosis not present

## 2021-01-01 LAB — BASIC METABOLIC PANEL
Anion gap: 8 (ref 5–15)
BUN: 24 mg/dL — ABNORMAL HIGH (ref 8–23)
CO2: 23 mmol/L (ref 22–32)
Calcium: 8.3 mg/dL — ABNORMAL LOW (ref 8.9–10.3)
Chloride: 107 mmol/L (ref 98–111)
Creatinine, Ser: 1.11 mg/dL (ref 0.61–1.24)
GFR, Estimated: >60 mL/min (ref >60)
Glucose, Bld: 126 mg/dL — ABNORMAL HIGH (ref 70–99)
Potassium: 4.1 mmol/L (ref 3.5–5.1)
Sodium: 138 mmol/L (ref 135–145)

## 2021-01-01 LAB — GLUCOSE, CAPILLARY
Glucose-Capillary: 128 mg/dL — ABNORMAL HIGH (ref 70–99)
Glucose-Capillary: 137 mg/dL — ABNORMAL HIGH (ref 70–99)
Glucose-Capillary: 149 mg/dL — ABNORMAL HIGH (ref 70–99)
Glucose-Capillary: 144 mg/dL — ABNORMAL HIGH (ref 70–99)

## 2021-01-01 LAB — CBC
HCT: 29.3 % — ABNORMAL LOW (ref 39.0–52.0)
Hemoglobin: 9.5 g/dL — ABNORMAL LOW (ref 13.0–17.0)
MCH: 31.4 pg (ref 26.0–34.0)
MCHC: 32.4 g/dL (ref 30.0–36.0)
MCV: 96.7 fL (ref 80.0–100.0)
Platelets: 253 10*3/uL (ref 150–400)
RBC: 3.03 MIL/uL — ABNORMAL LOW (ref 4.22–5.81)
RDW: 13.6 % (ref 11.5–15.5)
WBC: 6.5 10*3/uL (ref 4.0–10.5)
nRBC: 0 % (ref 0.0–0.2)

## 2021-01-01 MED ORDER — TRIAMCINOLONE ACETONIDE 0.1 % EX CREA
TOPICAL_CREAM | Freq: Two times a day (BID) | CUTANEOUS | Status: DC
  Filled 2021-01-01 (×2): qty 15

## 2021-01-01 MED ORDER — HYDROXYZINE HCL 10 MG PO TABS
10.0000 mg | ORAL_TABLET | Freq: Three times a day (TID) | ORAL | Status: DC | PRN
  Administered 2021-01-01 – 2021-01-11 (×15): 10 mg via ORAL
  Filled 2021-01-01 (×18): qty 1

## 2021-01-01 MED ORDER — HYDROXYZINE HCL 10 MG PO TABS
10.0000 mg | ORAL_TABLET | ORAL | Status: AC
  Administered 2021-01-01: 10 mg via ORAL
  Filled 2021-01-01: qty 1

## 2021-01-01 NOTE — Progress Notes (Signed)
Physical Therapy Treatment Patient Details Name: Brad Santiago MRN: [id number] DOB: [birth year] Today's Date: 01/01/2021    History of Present Illness Brad Santiago is a 80 y.o. male with medical history significant for hypertension, non-insulin-dependent diabetes mellitus, hypothyroid, hyperlipidemia, peripheral neuropathy, presents to the emergency department for chief concerns of passing out. Patient states he was sitting down on his chair when he felt weak in the last thing he remembered was seeing the coffee table about to catch his chin as he fell forward. He woke up on the floor.  He states he does not remember anything else.  Patient sustained a bimalleolar L ankle fracture. S/P external fixation.    PT Comments    Patient received in bed, gown off. Patient reports he has been itching like crazy. MD and RN aware. Patient agreeable to PT session. He requires set up assist for mobility due to external fixation being stuck in sheets/pillow. Otherwise he is mod independent for supin to sit. Patient requires mod assist for sit to stand from elevated bed. Patient unable to maintain NWB during standing for long, therefore assisted back to sitting on edge of bed with tray set up for him. Patient will continue to benefit from skilled PT while here to improve functional independence.         Follow Up Recommendations  SNF;Supervision/Assistance - 24 hour     Equipment Recommendations  None recommended by PT;Other (comment) (TBD)    Recommendations for Other Services       Precautions / Restrictions Precautions Precautions: Fall Other Brace: external fixator Restrictions Weight Bearing Restrictions: Yes LLE Weight Bearing: Non weight bearing    Mobility  Bed Mobility Overal bed mobility: Modified Independent Bed Mobility: Supine to Sit     Supine to sit: Modified independent (Device/Increase time)     General bed mobility comments: Patient required assist only to free external  fixation from sheets and pillow    Transfers Overall transfer level: Needs assistance Equipment used: Rolling walker (2 wheeled) Transfers: Sit to/from Stand Sit to Stand: From elevated surface;Min assist         General transfer comment: min assist this session from elevated bed, however he is unable to maintain NWB on left LE for very long at all.  Ambulation/Gait             General Gait Details: unable   Stairs             Wheelchair Mobility    Modified Rankin (Stroke Patients Only)       Balance Overall balance assessment: Needs assistance Sitting-balance support: Feet supported Sitting balance-Leahy Scale: Good     Standing balance support: Bilateral upper extremity supported;During functional activity Standing balance-Leahy Scale: Poor Standing balance comment: Patient able to static stand briefly to change sheets. Unable to maintain NWB for prolonged standing.                            Cognition Arousal/Alertness: Awake/alert Behavior During Therapy: WFL for tasks assessed/performed Overall Cognitive Status: Within Functional Limits for tasks assessed                                 General Comments: has a dry sense of humor.      Exercises Total Joint Exercises Long Arc Quad: AROM;Left;5 reps    General Comments  Pertinent Vitals/Pain Pain Assessment: Faces Faces Pain Scale: Hurts a little bit Pain Descriptors / Indicators: Discomfort;Sore Pain Intervention(s): Monitored during session;Repositioned    Home Living                      Prior Function            PT Goals (current goals can now be found in the care plan section) Acute Rehab PT Goals Patient Stated Goal: to get better PT Goal Formulation: With patient Time For Goal Achievement: 01/12/21 Potential to Achieve Goals: Fair Progress towards PT goals: Progressing toward goals    Frequency    7X/week      PT Plan  Current plan remains appropriate    Co-evaluation              AM-PAC PT "6 Clicks" Mobility   Outcome Measure  Help needed turning from your back to your side while in a flat bed without using bedrails?: A Little Help needed moving from lying on your back to sitting on the side of a flat bed without using bedrails?: A Little Help needed moving to and from a bed to a chair (including a wheelchair)?: Total Help needed standing up from a chair using your arms (e.g., wheelchair or bedside chair)?: A Lot Help needed to walk in hospital room?: Total Help needed climbing 3-5 steps with a railing? : Total 6 Click Score: 11    End of Session Equipment Utilized During Treatment: Gait belt Activity Tolerance: Patient limited by fatigue Patient left: in bed;Other (comment) (sitting on side of bed to eat) Nurse Communication: Mobility status PT Visit Diagnosis: Other abnormalities of gait and mobility (R26.89);Muscle weakness (generalized) (M62.81);History of falling (Z91.81)     Time: 1130-1153 PT Time Calculation (min) (ACUTE ONLY): 23 min  Charges:  $Therapeutic Exercise: 8-22 mins $Therapeutic Activity: 8-22 mins                     [employer], PT, GCS 01/01/21,12:08 PM

## 2021-01-01 NOTE — Progress Notes (Signed)
Occupational Therapy Treatment Patient Details Name: Brad Santiago MRN: [id number] DOB: Jul 21, 1941 Today's Date: 01/01/2021    History of present illness Brad Santiago is a 80 y.o. male with medical history significant for hypertension, non-insulin-dependent diabetes mellitus, hypothyroid, hyperlipidemia, peripheral neuropathy, presents to the emergency department for chief concerns of passing out. Patient states he was sitting down on his chair when he felt weak in the last thing he remembered was seeing the coffee table about to catch his chin as he fell forward. He woke up on the floor.  He states he does not remember anything else.  Patient sustained a bimalleolar L ankle fracture. S/P external fixation.   OT comments  Pt seen for OT treatment this date to f/u re: safety, positioning and strengthening as it pertains to ADLs/ADL mobility. Pt is fatigued this date and agreeable to session, but politely declines to get OOB. OT repositions pt's L LE to above heart level using pillows and bed settings to reduce edema as it is noted to be slightly more swollen than upon OT evaluation. In addition, OT educates re: rationale so that pt can advocate for himself to have optimal positioning. In addition, OT engages pt in one set x10 reps tricep pushes to increase strength as it pertains to sustaining NWB to L LE when standing with RW for UE support. OT educates pt on rationale and pt with moderate reception. Will require continued OT in acute setting for f/u as well as continued carryover in STR setting upon d/c to improve strength and tolerance for ADL performance.   Follow Up Recommendations  SNF    Equipment Recommendations  3 in 1 bedside commode;Tub/shower seat;Other (comment) (w/c)    Recommendations for Other Services      Precautions / Restrictions Precautions Precautions: Fall Required Braces or Orthoses: Other Brace Other Brace: external fixator Restrictions Weight Bearing Restrictions:  Yes LLE Weight Bearing: Non weight bearing       Mobility Bed Mobility               General bed mobility comments: pt politely declines d/t fatigue    Transfers                      Balance                                           ADL either performed or assessed with clinical judgement   ADL                                               Vision Patient Visual Report: No change from baseline     Perception     Praxis      Cognition Arousal/Alertness: Awake/alert Behavior During Therapy: WFL for tasks assessed/performed Overall Cognitive Status: Within Functional Limits for tasks assessed                                 General Comments: has a dry sense of humor.        Exercises Other Exercises Other Exercises: OT engages pt in repositioning to elevate L LE above heart level and educates re: rationale. In addition, OT  engages pt in UB strengthening for one set x10 reps tricep pushes (modified bed level) to increase strength as it applies to transferring while maintaining NWB status to L LE.   Shoulder Instructions       General Comments      Pertinent Vitals/ Pain       Pain Assessment: Faces Faces Pain Scale: Hurts a little bit Pain Location: L LE Pain Descriptors / Indicators: Discomfort;Sore Pain Intervention(s): Monitored during session;Repositioned  Home Living                                          Prior Functioning/Environment              Frequency  Min 1X/week        Progress Toward Goals  OT Goals(current goals can now be found in the care plan section)  Progress towards OT goals: Progressing toward goals  Acute Rehab OT Goals Patient Stated Goal: to get better OT Goal Formulation: With patient Time For Goal Achievement: 01/13/21 Potential to Achieve Goals: Good  Plan Discharge plan remains appropriate    Co-evaluation                  AM-PAC OT "6 Clicks" Daily Activity     Outcome Measure   Help from another person eating meals?: None Help from another person taking care of personal grooming?: A Little Help from another person toileting, which includes using toliet, bedpan, or urinal?: A Lot Help from another person bathing (including washing, rinsing, drying)?: A Lot Help from another person to put on and taking off regular upper body clothing?: A Little Help from another person to put on and taking off regular lower body clothing?: A Lot 6 Click Score: 16    End of Session    OT Visit Diagnosis: Unsteadiness on feet (R26.81);Muscle weakness (generalized) (M62.81);History of falling (Z91.81)   Activity Tolerance Patient tolerated treatment well   Patient Left in bed;with call bell/phone within reach;with bed alarm set   Nurse Communication Mobility status        Time: 5366-4403 OT Time Calculation (min): 11 min  Charges: OT General Charges $OT Visit: 1 Visit OT Treatments $Therapeutic Exercise: 8-22 mins  Rejeana Brock, MS, OTR/L ascom [phone] 01/01/21, 4:51 PM

## 2021-01-01 NOTE — Progress Notes (Signed)
Patient ID: Brad Santiago, male   DOB: 1941/04/06, 80 y.o.   MRN: [id number] [facility] PROGRESS NOTE  Brad Santiago GYB:[id number] DOB: May 15, 1941 DOA: 12/27/2020 PCP: Dorothey Baseman, MD  HPI/Subjective: Patient complaining of itching on his back.  He said this is really bothering him.  Some pain in his ankle.  Objective: Vitals:   01/01/21 0558 01/01/21 0729  BP: (!) 118/53 120/67  Pulse: 63 65  Resp: 18 15  Temp: 98 F (36.7 C) 98.1 F (36.7 C)  SpO2: 96% 96%    Intake/Output Summary (Last 24 hours) at 01/01/2021 1624 Last data filed at 01/01/2021 1300 Gross per 24 hour  Intake 240 ml  Output 2000 ml  Net -1760 ml   Filed Weights   12/27/20 1751  Weight: 104.3 kg    ROS: Review of Systems  Respiratory: Negative for shortness of breath.   Cardiovascular: Negative for chest pain.  Gastrointestinal: Negative for abdominal pain, nausea and vomiting.  Skin: Positive for itching.   Exam: Physical Exam HENT:     Head: Normocephalic.     Mouth/Throat:     Pharynx: No oropharyngeal exudate.  Eyes:     General: Lids are normal.     Conjunctiva/sclera: Conjunctivae normal.     Pupils: Pupils are equal, round, and reactive to light.  Cardiovascular:     Rate and Rhythm: Normal rate and regular rhythm.     Heart sounds: Normal heart sounds, S1 normal and S2 normal.  Pulmonary:     Breath sounds: Normal breath sounds. No decreased breath sounds, wheezing, rhonchi or rales.  Abdominal:     Palpations: Abdomen is soft.     Tenderness: There is no abdominal tenderness.  Musculoskeletal:     Right lower leg: Swelling present.     Left lower leg: Swelling present.     Comments: External hardware left ankle.  Skin:    General: Skin is warm.     Comments: Redness on the left back.  Cat scratched on the legs.  Neurological:     Mental Status: He is alert and oriented to person, place, and time.       Data Reviewed: Basic Metabolic Panel: Recent Labs  Lab  12/28/20 0348 12/29/20 0525 12/30/20 0531 12/31/20 0543 01/01/21 0719  NA 138 138 134* 139 138  K 5.2* 5.3* 5.1 4.5 4.1  CL 109 110 106 109 107  CO2 23 22 21* 23 23  GLUCOSE 99 168* 205* 136* 126*  BUN 40* 41* 37* 35* 24*  CREATININE 1.74* 1.67* 1.31* 1.31* 1.11  CALCIUM 8.6* 8.2* 7.8* 8.1* 8.3*   Liver Function Tests: Recent Labs  Lab 12/27/20 1759  AST 24  ALT 12  ALKPHOS 78  BILITOT 0.6  PROT 6.8  ALBUMIN 3.7   CBC: Recent Labs  Lab 12/28/20 0348 12/29/20 0525 12/30/20 0531 12/31/20 0543 01/01/21 0719  WBC 7.6 8.4 10.4 8.7 6.5  HGB 9.4* 9.0* 8.9* 8.5* 9.5*  HCT 29.2* 27.5* 27.3* 26.4* 29.3*  MCV 98.3 99.3 97.2 97.8 96.7  PLT 210 194 204 229 253    CBG: Recent Labs  Lab 12/31/20 1137 12/31/20 1538 12/31/20 2042 01/01/21 0728 01/01/21 1151  GLUCAP 136* 132* 134* 128* 137*    Recent Results (from the past 240 hour(s))  SARS CORONAVIRUS 2 (TAT 6-24 HRS) Nasopharyngeal Nasopharyngeal Swab     Status: None   Collection Time: 12/27/20  9:16 PM   Specimen: Nasopharyngeal Swab  Result Value Ref Range Status  SARS Coronavirus 2 NEGATIVE NEGATIVE Final    Comment: (NOTE) SARS-CoV-2 target nucleic acids are NOT DETECTED.  The SARS-CoV-2 RNA is generally detectable in upper and lower respiratory specimens during the acute phase of infection. Negative results do not preclude SARS-CoV-2 infection, do not rule out co-infections with other pathogens, and should not be used as the sole basis for treatment or other patient management decisions. Negative results must be combined with clinical observations, patient history, and epidemiological information. The expected result is Negative.  Fact Sheet for Patients: HairSlick.no  Fact Sheet for Healthcare Providers: quierodirigir.com  This test is not yet approved or cleared by the [place] FDA and  has been authorized for detection and/or diagnosis of  SARS-CoV-2 by FDA under an Emergency Use Authorization (EUA). This EUA will remain  in effect (meaning this test can be used) for the duration of the COVID-19 declaration under Se ction 564(b)(1) of the Act, 21 U.S.C. section 360bbb-3(b)(1), unless the authorization is terminated or revoked sooner.  Performed at [facility], [address] [place], [place] [address]   Surgical PCR screen     Status: None   Collection Time: 12/29/20  7:45 AM   Specimen: Nasal Mucosa; Nasal Swab  Result Value Ref Range Status   MRSA, PCR NEGATIVE NEGATIVE Final   Staphylococcus aureus NEGATIVE NEGATIVE Final    Comment: (NOTE) The Xpert SA Assay (FDA approved for NASAL specimens in patients 17 years of age and older), is one component of a comprehensive surveillance program. It is not intended to diagnose infection nor to guide or monitor treatment. Performed at [facility], [address] [place], [place] [address]      Studies: No results found.  Scheduled Meds: . atorvastatin  80 mg Oral QHS  . azithromycin  250 mg Oral Daily  . escitalopram  20 mg Oral Daily  . feeding supplement  237 mL Oral BID BM  . insulin aspart  0-5 Units Subcutaneous QHS  . insulin aspart  0-9 Units Subcutaneous TID WC  . levothyroxine  25 mcg Oral Q0600  . triamcinolone cream   Topical BID    Assessment/Plan:  1. Closed left ankle fracture.  On 12/29/2020, patient had external hardware placed by Dr. Okey Dupre orthopedic surgery.  Orthopedic surgery would like to wait 7 to 10 days for skin to improve prior to definitive operative procedure.  Transitional care team looking into rehabs.  Was still waiting insurance authorization as of Friday evening. 2. Bilateral lower extremity cat scratches.  Continue quick course of oral Zithromax. 3. Hyperkalemia improved.  Potassium 4.1 today. 4. Acute kidney injury on chronic kidney disease stage II.  Creatinine 1.74 on presentation down to 1.11  today 5. Rash on back with itching.  Doubt secondary to medications because its only on the back.  We will give triamcinolone cream and as needed Atarax. 6. Type 2 diabetes mellitus with hyperlipidemia.  Patient did have hypoglycemia at home.  Hemoglobin A1c 6.1.  Discontinue glipizide upon going home.  Currently on sliding scale here. 7. Hypothyroidism unspecified.  On levothyroxine 8. Depression.  Continue Lexapro 9. Weakness.  Physical therapy recommends rehab.        Code Status:     Code Status Orders  (From admission, onward)         Start     Ordered   12/27/20 2012  Full code  Continuous        12/27/20 2013  Code Status History    This patient has a current code status but no historical code status.   Advance Care Planning Activity     Family Communication: Spoke with son on the phone Disposition Plan: Status is: Inpatient  Dispo: The patient is from: Home              Anticipated d/c is to: Rehab              Patient currently awaiting skin to improve prior to ankle surgery.  Have not received insurance authorization to go out to rehab at this point.   Difficult to place patient.  Yes.  Time spent: 28 minutes  Stella Encarnacion [employer]

## 2021-01-01 NOTE — TOC Progression Note (Signed)
Transition of Care [facility]) - Progression Note    Patient Details  Name: Brad Santiago MRN: [id number] Date of Birth: 1941/05/31  Transition of Care [facility]) CM/SW Contact  Barrie Dunker, RN Phone Number: 01/01/2021, 9:02 AM  Clinical Narrative:   The patient is to DC to [facility], I reached out to confirm that they have a bed to DC and requested status of insurance auth, awaiting a responce         Expected Discharge Plan and Services                                                 Social Determinants of Health (SDOH) Interventions    Readmission Risk Interventions No flowsheet data found.

## 2021-01-02 DIAGNOSIS — E875 Hyperkalemia: Secondary | ICD-10-CM | POA: Diagnosis not present

## 2021-01-02 DIAGNOSIS — S82892A Other fracture of left lower leg, initial encounter for closed fracture: Secondary | ICD-10-CM | POA: Diagnosis not present

## 2021-01-02 DIAGNOSIS — N179 Acute kidney failure, unspecified: Secondary | ICD-10-CM | POA: Diagnosis not present

## 2021-01-02 DIAGNOSIS — R21 Rash and other nonspecific skin eruption: Secondary | ICD-10-CM | POA: Diagnosis not present

## 2021-01-02 LAB — GLUCOSE, CAPILLARY
Glucose-Capillary: 147 mg/dL — ABNORMAL HIGH (ref 70–99)
Glucose-Capillary: 135 mg/dL — ABNORMAL HIGH (ref 70–99)
Glucose-Capillary: 122 mg/dL — ABNORMAL HIGH (ref 70–99)
Glucose-Capillary: 168 mg/dL — ABNORMAL HIGH (ref 70–99)

## 2021-01-02 MED ORDER — PIOGLITAZONE HCL 30 MG PO TABS
30.0000 mg | ORAL_TABLET | Freq: Every day | ORAL | Status: DC
  Administered 2021-01-03 – 2021-01-07 (×5): 30 mg via ORAL
  Filled 2021-01-02 (×5): qty 1

## 2021-01-02 NOTE — Progress Notes (Signed)
Physical Therapy Treatment Patient Details Name: Brad Santiago MRN: [id number] DOB: 04-20-1941 Today's Date: 01/02/2021    History of Present Illness Brad Santiago is a 80 y.o. male with medical history significant for hypertension, non-insulin-dependent diabetes mellitus, hypothyroid, hyperlipidemia, peripheral neuropathy, presents to the emergency department for chief concerns of passing out. Patient states he was sitting down on his chair when he felt weak in the last thing he remembered was seeing the coffee table about to catch his chin as he fell forward. He woke up on the floor.  He states he does not remember anything else.  Patient sustained a bimalleolar L ankle fracture. S/P external fixation.    PT Comments    Pt was long sitting in bed. Agrees to PT session. He is A and O x 4 and only complaint was due to rash on lower back/L side." I'm itchy as hell but lets see what we can do." Pt did require min assist to exit L side of  bed, stood 2 x EOB with assistance and with poor ability to maintain NWB. Once in standing, pt was able to tolerate standing while maintaining NWB. Was unable to progress to "hopping" but was able to pivot on RLE with maintaining NWB. Overall pt is progressing well. Will benefit from continued skilled PT at DC to address deficits while assisting pt to PLOF. RN aware and has been applying cream to rash. Bed alarm was in place, LLE elevated on multiple pillows and call bell in reach at conclusion of session.     Follow Up Recommendations  SNF;Supervision/Assistance - 24 hour     Equipment Recommendations  None recommended by PT;Other (comment) (defer to next level of care)       Precautions / Restrictions Precautions Precautions: Fall Required Braces or Orthoses: Other Brace (External fixator L ankle) Other Brace: external fixator Restrictions Weight Bearing Restrictions: Yes LLE Weight Bearing: Non weight bearing    Mobility  Bed Mobility Overal bed  mobility: Needs Assistance Bed Mobility: Supine to Sit     Supine to sit: Min assist Sit to supine: Supervision   General bed mobility comments: Pt did require min assist to exit L side of bed with vcs throughout from improved technique and HOB elevated. was able to return to bed without physical assistance with increased time    Transfers Overall transfer level: Needs assistance Equipment used: Rolling walker (2 wheeled) Transfers: Sit to/from Stand Sit to Stand: From elevated surface;Min assist         General transfer comment: Pt puts great effort to maintain NWB however is unable to achieve standing without PWB. Once in standing, was able to maintain NWB well.  Ambulation/Gait      General Gait Details: Pt able to pivot on RLE however unable to "hop" to ambulate      Balance Overall balance assessment: Needs assistance Sitting-balance support: Feet supported (RLE supported) Sitting balance-Leahy Scale: Good     Standing balance support: Bilateral upper extremity supported;During functional activity Standing balance-Leahy Scale: Fair Standing balance comment: heavy reliance on RW      Cognition Arousal/Alertness: Awake/alert Behavior During Therapy: WFL for tasks assessed/performed Overall Cognitive Status: Within Functional Limits for tasks assessed      General Comments: Pt is A and O x 4. cooperative. likes to joke around         General Comments General comments (skin integrity, edema, etc.): Pt was educated on importance of ther ex from UE and RLE to promote  strengthening. Performed exercises in bed x ~ 15 minutes      Pertinent Vitals/Pain Pain Assessment: 0-10 Pain Score: 5  Faces Pain Scale: Hurts a little bit Pain Location: L LE Pain Descriptors / Indicators: Discomfort;Sore Pain Intervention(s): Limited activity within patient's tolerance;Monitored during session;Premedicated before session;Repositioned           PT Goals (current goals can  now be found in the care plan section) Acute Rehab PT Goals Patient Stated Goal: to get better Progress towards PT goals: Progressing toward goals    Frequency    7X/week      PT Plan Current plan remains appropriate    Co-evaluation     PT goals addressed during session: Mobility/safety with mobility;Balance;Proper use of DME;Strengthening/ROM        AM-PAC PT "6 Clicks" Mobility   Outcome Measure  Help needed turning from your back to your side while in a flat bed without using bedrails?: A Little Help needed moving from lying on your back to sitting on the side of a flat bed without using bedrails?: A Little Help needed moving to and from a bed to a chair (including a wheelchair)?: Total Help needed standing up from a chair using your arms (e.g., wheelchair or bedside chair)?: A Lot Help needed to walk in hospital room?: Total Help needed climbing 3-5 steps with a railing? : Total 6 Click Score: 11    End of Session Equipment Utilized During Treatment: Gait belt Activity Tolerance: Patient limited by fatigue Patient left: in bed;with call bell/phone within reach Nurse Communication: Mobility status PT Visit Diagnosis: Other abnormalities of gait and mobility (R26.89);Muscle weakness (generalized) (M62.81);History of falling (Z91.81)     Time: 7619-5093 PT Time Calculation (min) (ACUTE ONLY): 40 min  Charges:  $Therapeutic Exercise: 8-22 mins $Therapeutic Activity: 23-37 mins                     Jetta Lout PTA 01/02/21, 3:08 PM

## 2021-01-02 NOTE — Progress Notes (Signed)
Patient ID: Brad Santiago, male   DOB: 1940/12/21, 80 y.o.   MRN: [id number] [facility] PROGRESS NOTE  Brad Santiago TKZ:[id number] DOB: 1940/12/25 DOA: 12/27/2020 PCP: Dorothey Baseman, MD  HPI/Subjective: Patient feeling little more pain in his ankle.  Patient still has a rash on his back but will lower down.  Doing better with the itching pill.  Admitted with low sugar and found to have an ankle fracture.  Objective: Vitals:   01/02/21 0546 01/02/21 0808  BP: (!) 118/58 (!) 120/59  Pulse: 64 61  Resp: 18 17  Temp: 98.2 F (36.8 C) 98.2 F (36.8 C)  SpO2: 93% 96%    Intake/Output Summary (Last 24 hours) at 01/02/2021 1453 Last data filed at 01/02/2021 1354 Gross per 24 hour  Intake 700 ml  Output 1200 ml  Net -500 ml   Filed Weights   12/27/20 1751  Weight: 104.3 kg    ROS: Review of Systems  Respiratory: Negative for shortness of breath.   Cardiovascular: Negative for chest pain.  Gastrointestinal: Negative for abdominal pain, nausea and vomiting.   Exam: Physical Exam HENT:     Head: Normocephalic.     Mouth/Throat:     Pharynx: No oropharyngeal exudate.  Eyes:     General: Lids are normal.     Conjunctiva/sclera: Conjunctivae normal.  Cardiovascular:     Rate and Rhythm: Normal rate and regular rhythm.     Heart sounds: Normal heart sounds, S1 normal and S2 normal.  Pulmonary:     Breath sounds: No decreased breath sounds, wheezing, rhonchi or rales.  Abdominal:     Palpations: Abdomen is soft.     Tenderness: There is no abdominal tenderness.  Musculoskeletal:     Right ankle: Swelling present.     Left ankle: Swelling present.  Skin:    General: Skin is warm.     Comments: Large erythematous rash blanching lower back.  Healing cat scratches bilateral lower extremities.  Neurological:     Mental Status: He is alert and oriented to person, place, and time.       Data Reviewed: Basic Metabolic Panel: Recent Labs  Lab 12/28/20 0348  12/29/20 0525 12/30/20 0531 12/31/20 0543 01/01/21 0719  NA 138 138 134* 139 138  K 5.2* 5.3* 5.1 4.5 4.1  CL 109 110 106 109 107  CO2 23 22 21* 23 23  GLUCOSE 99 168* 205* 136* 126*  BUN 40* 41* 37* 35* 24*  CREATININE 1.74* 1.67* 1.31* 1.31* 1.11  CALCIUM 8.6* 8.2* 7.8* 8.1* 8.3*   Liver Function Tests: Recent Labs  Lab 12/27/20 1759  AST 24  ALT 12  ALKPHOS 78  BILITOT 0.6  PROT 6.8  ALBUMIN 3.7   CBC: Recent Labs  Lab 12/28/20 0348 12/29/20 0525 12/30/20 0531 12/31/20 0543 01/01/21 0719  WBC 7.6 8.4 10.4 8.7 6.5  HGB 9.4* 9.0* 8.9* 8.5* 9.5*  HCT 29.2* 27.5* 27.3* 26.4* 29.3*  MCV 98.3 99.3 97.2 97.8 96.7  PLT 210 194 204 229 253    CBG: Recent Labs  Lab 01/01/21 1151 01/01/21 1658 01/01/21 2137 01/02/21 0808 01/02/21 1156  GLUCAP 137* 149* 144* 147* 135*    Recent Results (from the past 240 hour(s))  SARS CORONAVIRUS 2 (TAT 6-24 HRS) Nasopharyngeal Nasopharyngeal Swab     Status: None   Collection Time: 12/27/20  9:16 PM   Specimen: Nasopharyngeal Swab  Result Value Ref Range Status   SARS Coronavirus 2 NEGATIVE NEGATIVE Final  Comment: (NOTE) SARS-CoV-2 target nucleic acids are NOT DETECTED.  The SARS-CoV-2 RNA is generally detectable in upper and lower respiratory specimens during the acute phase of infection. Negative results do not preclude SARS-CoV-2 infection, do not rule out co-infections with other pathogens, and should not be used as the sole basis for treatment or other patient management decisions. Negative results must be combined with clinical observations, patient history, and epidemiological information. The expected result is Negative.  Fact Sheet for Patients: HairSlick.no  Fact Sheet for Healthcare Providers: quierodirigir.com  This test is not yet approved or cleared by the [place] FDA and  has been authorized for detection and/or diagnosis of SARS-CoV-2  by FDA under an Emergency Use Authorization (EUA). This EUA will remain  in effect (meaning this test can be used) for the duration of the COVID-19 declaration under Se ction 564(b)(1) of the Act, 21 U.S.C. section 360bbb-3(b)(1), unless the authorization is terminated or revoked sooner.  Performed at [facility], [address], [place], [place] [address]   Surgical PCR screen     Status: None   Collection Time: 12/29/20  7:45 AM   Specimen: Nasal Mucosa; Nasal Swab  Result Value Ref Range Status   MRSA, PCR NEGATIVE NEGATIVE Final   Staphylococcus aureus NEGATIVE NEGATIVE Final    Comment: (NOTE) The Xpert SA Assay (FDA approved for NASAL specimens in patients 21 years of age and older), is one component of a comprehensive surveillance program. It is not intended to diagnose infection nor to guide or monitor treatment. Performed at [facility], [address] [place], [place] [address]       Scheduled Meds: . atorvastatin  80 mg Oral QHS  . azithromycin  250 mg Oral Daily  . escitalopram  20 mg Oral Daily  . feeding supplement  237 mL Oral BID BM  . insulin aspart  0-5 Units Subcutaneous QHS  . insulin aspart  0-9 Units Subcutaneous TID WC  . levothyroxine  25 mcg Oral Q0600  . triamcinolone cream   Topical BID    Assessment/Plan:  1. Closed left ankle fracture.  On 12/29/2020, patient had external hardware placed by Dr. Okey Dupre orthopedic surgery.  Dr. Okey Dupre would like to wait 7 to 10 days for skin to improve prior to definitive operative procedure.  Did not receive insurance authorization to go out to rehab at this point.  I asked Dr. Okey Dupre to come and evaluate on Monday the patient's skin for an idea of how soon we can do surgery. 2. Bilateral lower extremity cat scratches.  Finish up 5 days of Zithromax. 3. Rash on back with itching.  I doubt this is secondary to medication because its only on his back.  Continue triamcinolone cream and Atarax  as needed.  Advised to sit up in the chair and get his back up off the bed. 4. Type 2 diabetes mellitus with hyperlipidemia.  Patient did have hypoglycemia at home likely secondary to the glipizide at home.  Hemoglobin A1c 6.1.  Will restart on Actos only. 5. Hyperkalemia.  This has resolved 6. Acute kidney injury on chronic kidney disease stage II.  Creatinine 1.74 on presentation down to 1.1 as of yesterday 7. Depression on Lexapro 8. Weakness.  Physical therapy recommends rehab     Code Status:     Code Status Orders  (From admission, onward)         Start     Ordered   12/27/20 2012  Full code  Continuous        12/27/20 2013        Code Status History    This patient has a current code status but no historical code status.   Advance Care Planning Activity     Family Communication: Spoke with son yesterday Disposition Plan: Status is: Inpatient  Dispo: The patient is from: Home              Anticipated d/c is to: Rehab pending insurance authorization              Patient currently has external hardware on his foot for ankle fracture.  Orthopedic surgery waiting for skin to look better before they do a definitive procedure.   Difficult to place patient.  Yes, I have not received insurance authorization.  Consultants:  Orthopedic surgery  Procedures:  External hardware placed on 12/29/2020  Antibiotics:  Zithromax  Time spent: 27 minutes  [employer]

## 2021-01-03 ENCOUNTER — Encounter: Payer: Self-pay | Admitting: Internal Medicine

## 2021-01-03 DIAGNOSIS — N179 Acute kidney failure, unspecified: Secondary | ICD-10-CM | POA: Diagnosis not present

## 2021-01-03 DIAGNOSIS — E1169 Type 2 diabetes mellitus with other specified complication: Secondary | ICD-10-CM | POA: Diagnosis not present

## 2021-01-03 DIAGNOSIS — R21 Rash and other nonspecific skin eruption: Secondary | ICD-10-CM | POA: Diagnosis not present

## 2021-01-03 DIAGNOSIS — S82892A Other fracture of left lower leg, initial encounter for closed fracture: Secondary | ICD-10-CM | POA: Diagnosis not present

## 2021-01-03 LAB — GLUCOSE, CAPILLARY
Glucose-Capillary: 132 mg/dL — ABNORMAL HIGH (ref 70–99)
Glucose-Capillary: 155 mg/dL — ABNORMAL HIGH (ref 70–99)
Glucose-Capillary: 135 mg/dL — ABNORMAL HIGH (ref 70–99)
Glucose-Capillary: 172 mg/dL — ABNORMAL HIGH (ref 70–99)

## 2021-01-03 MED ORDER — TRIAMCINOLONE ACETONIDE 0.1 % EX CREA
TOPICAL_CREAM | Freq: Three times a day (TID) | CUTANEOUS | Status: DC
  Administered 2021-01-04 – 2021-01-09 (×5): 1 via TOPICAL
  Filled 2021-01-03 (×4): qty 15

## 2021-01-03 MED ORDER — HYDROGEN PEROXIDE 3 % EX SOLN
1.0000 "application " | CUTANEOUS | Status: DC | PRN
  Filled 2021-01-03: qty 237

## 2021-01-03 NOTE — Progress Notes (Signed)
No acute events overnight, pain controlled on oral analgesic, pin site and wound care done as ordered. Observation continues,

## 2021-01-03 NOTE — Progress Notes (Signed)
Patient ID: Brad Santiago, male   DOB: October 05, 1940, 80 y.o.   MRN: [id number] [facility] PROGRESS NOTE  Brad Santiago [id number]:[id number] DOB: 06/22/1941 DOA: 12/27/2020 PCP: Dorothey Baseman, MD  HPI/Subjective: Patient still has itching on his back.  The Atarax does make him a little sleepy.  Came in initially with low sugar and found to have an ankle fracture.  Ankle hurting quite a bit  Objective: Vitals:   01/03/21 0446 01/03/21 0753  BP: (!) 114/55 (!) 120/59  Pulse: 62 62  Resp: 16 18  Temp: 97.9 F (36.6 C) 98.2 F (36.8 C)  SpO2: 91% 96%    Intake/Output Summary (Last 24 hours) at 01/03/2021 1524 Last data filed at 01/03/2021 1349 Gross per 24 hour  Intake 840 ml  Output 200 ml  Net 640 ml   Filed Weights   12/27/20 1751  Weight: 104.3 kg    ROS: Review of Systems  Respiratory: Negative for shortness of breath.   Cardiovascular: Negative for chest pain.  Gastrointestinal: Negative for abdominal pain, nausea and vomiting.  Musculoskeletal: Positive for joint pain.  Skin: Positive for rash.   Exam: Physical Exam HENT:     Head: Normocephalic.     Mouth/Throat:     Pharynx: No oropharyngeal exudate.  Eyes:     General: Lids are normal.     Conjunctiva/sclera: Conjunctivae normal.     Pupils: Pupils are equal, round, and reactive to light.  Cardiovascular:     Rate and Rhythm: Normal rate and regular rhythm.     Heart sounds: Normal heart sounds, S1 normal and S2 normal.  Pulmonary:     Breath sounds: Normal breath sounds. No decreased breath sounds, wheezing, rhonchi or rales.  Abdominal:     Palpations: Abdomen is soft.     Tenderness: There is no abdominal tenderness.  Musculoskeletal:     Right lower leg: No swelling.     Left lower leg: Swelling present.  Skin:    General: Skin is warm.     Findings: Rash present.  Neurological:     Mental Status: He is alert and oriented to person, place, and time.       Data Reviewed: Basic Metabolic  Panel: Recent Labs  Lab 12/28/20 0348 12/29/20 0525 12/30/20 0531 12/31/20 0543 01/01/21 0719  NA 138 138 134* 139 138  K 5.2* 5.3* 5.1 4.5 4.1  CL 109 110 106 109 107  CO2 23 22 21* 23 23  GLUCOSE 99 168* 205* 136* 126*  BUN 40* 41* 37* 35* 24*  CREATININE 1.74* 1.67* 1.31* 1.31* 1.11  CALCIUM 8.6* 8.2* 7.8* 8.1* 8.3*   Liver Function Tests: Recent Labs  Lab 12/27/20 1759  AST 24  ALT 12  ALKPHOS 78  BILITOT 0.6  PROT 6.8  ALBUMIN 3.7   CBC: Recent Labs  Lab 12/28/20 0348 12/29/20 0525 12/30/20 0531 12/31/20 0543 01/01/21 0719  WBC 7.6 8.4 10.4 8.7 6.5  HGB 9.4* 9.0* 8.9* 8.5* 9.5*  HCT 29.2* 27.5* 27.3* 26.4* 29.3*  MCV 98.3 99.3 97.2 97.8 96.7  PLT 210 194 204 229 253    CBG: Recent Labs  Lab 01/02/21 1156 01/02/21 1636 01/02/21 2135 01/03/21 0752 01/03/21 1157  GLUCAP 135* 122* 168* 132* 155*    Recent Results (from the past 240 hour(s))  SARS CORONAVIRUS 2 (TAT 6-24 HRS) Nasopharyngeal Nasopharyngeal Swab     Status: None   Collection Time: 12/27/20  9:16 PM   Specimen: Nasopharyngeal Swab  Result Value  Ref Range Status   SARS Coronavirus 2 NEGATIVE NEGATIVE Final    Comment: (NOTE) SARS-CoV-2 target nucleic acids are NOT DETECTED.  The SARS-CoV-2 RNA is generally detectable in upper and lower respiratory specimens during the acute phase of infection. Negative results do not preclude SARS-CoV-2 infection, do not rule out co-infections with other pathogens, and should not be used as the sole basis for treatment or other patient management decisions. Negative results must be combined with clinical observations, patient history, and epidemiological information. The expected result is Negative.  Fact Sheet for Patients: HairSlick.no  Fact Sheet for Healthcare Providers: quierodirigir.com  This test is not yet approved or cleared by the [place] FDA and  has been authorized for  detection and/or diagnosis of SARS-CoV-2 by FDA under an Emergency Use Authorization (EUA). This EUA will remain  in effect (meaning this test can be used) for the duration of the COVID-19 declaration under Se ction 564(b)(1) of the Act, 21 U.S.C. section 360bbb-3(b)(1), unless the authorization is terminated or revoked sooner.  Performed at [facility], [address] [place], [place] [address]   Surgical PCR screen     Status: None   Collection Time: 12/29/20  7:45 AM   Specimen: Nasal Mucosa; Nasal Swab  Result Value Ref Range Status   MRSA, PCR NEGATIVE NEGATIVE Final   Staphylococcus aureus NEGATIVE NEGATIVE Final    Comment: (NOTE) The Xpert SA Assay (FDA approved for NASAL specimens in patients 56 years of age and older), is one component of a comprehensive surveillance program. It is not intended to diagnose infection nor to guide or monitor treatment. Performed at [facility], [address] [place], [place] [address]       Scheduled Meds: . atorvastatin  80 mg Oral QHS  . escitalopram  20 mg Oral Daily  . feeding supplement  237 mL Oral BID BM  . insulin aspart  0-5 Units Subcutaneous QHS  . insulin aspart  0-9 Units Subcutaneous TID WC  . levothyroxine  25 mcg Oral Q0600  . pioglitazone  30 mg Oral Daily  . triamcinolone cream   Topical TID    Assessment/Plan:  1. Closed left ankle fracture.  On 12/29/2020, Dr. Okey Dupre placed external hardware to stabilize fracture.  He recommended to 7 to 10 days for skin to improve prior to definitive operative procedure.  Insurance company did not give [place] authorization to go out to rehab on Friday.  I asked Dr. Okey Dupre to come evaluate the patient's skin on Monday to give an estimate on when surgery can be done. 2. Bilateral lower extremity cat scratches.  Finished up 5 days of Zithromax. 3. Erythematous rash on back with itching.  Likely contact from sweating in the bed.  Triamcinolone cream increased to 3  times daily and Atarax as needed. 4. Type 2 diabetes mellitus with hyperlipidemia.  Patient did have hypoglycemia at home and likely secondary to glipizide.  Hemoglobin A1c 6.1.  Restart Actos only today.  Continue atorvastatin. 5. Hyperkalemia.  Resolved 6. Acute kidney injury on chronic kidney disease stage II.  Creatinine 1.74 on presentation down to 1.1 on last check. 7. Depression.  Continue Lexapro. 8. Weakness.  Physical therapy still recommends rehab.        Code Status:     Code Status Orders  (From admission, onward)         Start     Ordered   12/27/20 2012  Full code  Continuous  12/27/20 2013        Code Status History    This patient has a current code status but no historical code status.   Advance Care Planning Activity     Family Communication: Spoke with patient's son on the phone Disposition Plan: Status is: Inpatient  Dispo: The patient is from: Home              Anticipated d/c is to: Rehab              Patient currently will have orthopedic reevaluation on when to do surgery.  Still awaiting insurance authorization for rehab.   Difficult to place patient.  Yes.  Consultants:  Orthopedic surgery  Time spent: 27 minutes  [employer]

## 2021-01-03 NOTE — Progress Notes (Signed)
Physical Therapy Treatment Patient Details Name: Brad Santiago MRN: [id number] DOB: [birth year] Today's Date: 01/03/2021    History of Present Illness Brad Santiago is a 80 y.o. male with medical history significant for hypertension, non-insulin-dependent diabetes mellitus, hypothyroid, hyperlipidemia, peripheral neuropathy, presents to the emergency department for chief concerns of passing out. Patient states he was sitting down on his chair when he felt weak in the last thing he remembered was seeing the coffee table about to catch his chin as he fell forward. He woke up on the floor.  He states he does not remember anything else.  Patient sustained a bimalleolar L ankle fracture. S/P external fixation.    PT Comments    Participated in exercises as described below.  To EOB with min a x 1 and cues for hand placements/sequencing.  Steady in sitting.  Given difficulty maintaining NWB with mobility. Lateral scoot transfers were taught to pt and staff.  He is able to lateral scoot to right to drop arm recliner with min guard/min assist.  Initially does well but has trouble finishing transfer over arm and min assist is required.  Once in chair BM smear noted and care provided.  Pt stated he knew he was soiled but did not mention it prior to getting up.  Educated on need to alert staff for skin integrity.  Voiced understanding. Remained in recliner after session.   Follow Up Recommendations  SNF     Equipment Recommendations       Recommendations for Other Services       Precautions / Restrictions Precautions Precautions: Fall Required Braces or Orthoses: Other Brace (External fixator L ankle) Other Brace: external fixator Restrictions Weight Bearing Restrictions: Yes LLE Weight Bearing: Non weight bearing    Mobility  Bed Mobility Overal bed mobility: Needs Assistance Bed Mobility: Supine to Sit     Supine to sit: Min assist          Transfers Overall transfer level: Needs  assistance Equipment used: None Transfers: Lateral/Scoot Transfers           General transfer comment: given trouble yesterday maintianing WNS lateral scoot transfers were taught to pt and staff  Ambulation/Gait             General Gait Details: unable   Stairs             Wheelchair Mobility    Modified Rankin (Stroke Patients Only)       Balance Overall balance assessment: Needs assistance Sitting-balance support: Feet supported (RLE supported) Sitting balance-Leahy Scale: Good                                      Cognition Arousal/Alertness: Awake/alert Behavior During Therapy: WFL for tasks assessed/performed Overall Cognitive Status: Within Functional Limits for tasks assessed                                 General Comments: Pt is A and O x 4. cooperative. likes to joke around      Exercises Other Exercises Other Exercises: LLE AROM for available ranges with min assist    General Comments        Pertinent Vitals/Pain Pain Assessment: 0-10 Pain Score: 5  Pain Location: L LE Pain Descriptors / Indicators: Discomfort;Sore Pain Intervention(s): Limited activity within patient's tolerance;Premedicated before session;Repositioned;Monitored during session  Home Living                      Prior Function            PT Goals (current goals can now be found in the care plan section) Progress towards PT goals: Progressing toward goals    Frequency    7X/week      PT Plan Current plan remains appropriate    Co-evaluation              AM-PAC PT "6 Clicks" Mobility   Outcome Measure  Help needed turning from your back to your side while in a flat bed without using bedrails?: A Little Help needed moving from lying on your back to sitting on the side of a flat bed without using bedrails?: A Little Help needed moving to and from a bed to a chair (including a wheelchair)?: A Lot Help needed  standing up from a chair using your arms (e.g., wheelchair or bedside chair)?: A Lot Help needed to walk in hospital room?: Total Help needed climbing 3-5 steps with a railing? : Total 6 Click Score: 12    End of Session Equipment Utilized During Treatment: Gait belt Activity Tolerance: Patient tolerated treatment well Patient left: in chair;with call bell/phone within reach;with chair alarm set Nurse Communication: Mobility status PT Visit Diagnosis: Other abnormalities of gait and mobility (R26.89);Muscle weakness (generalized) (M62.81);History of falling (Z91.81)     Time: 9798-9211 PT Time Calculation (min) (ACUTE ONLY): 23 min  Charges:  $Therapeutic Exercise: 8-22 mins $Therapeutic Activity: 8-22 mins                    Danielle Dess, PTA 01/03/21, 10:33 AM

## 2021-01-04 DIAGNOSIS — E875 Hyperkalemia: Secondary | ICD-10-CM | POA: Diagnosis not present

## 2021-01-04 DIAGNOSIS — R21 Rash and other nonspecific skin eruption: Secondary | ICD-10-CM | POA: Diagnosis not present

## 2021-01-04 DIAGNOSIS — N179 Acute kidney failure, unspecified: Secondary | ICD-10-CM | POA: Diagnosis not present

## 2021-01-04 DIAGNOSIS — D649 Anemia, unspecified: Secondary | ICD-10-CM

## 2021-01-04 DIAGNOSIS — S82892A Other fracture of left lower leg, initial encounter for closed fracture: Secondary | ICD-10-CM | POA: Diagnosis not present

## 2021-01-04 LAB — CBC
HCT: 26.9 % — ABNORMAL LOW (ref 39.0–52.0)
Hemoglobin: 8.8 g/dL — ABNORMAL LOW (ref 13.0–17.0)
MCH: 31.4 pg (ref 26.0–34.0)
MCHC: 32.7 g/dL (ref 30.0–36.0)
MCV: 96.1 fL (ref 80.0–100.0)
Platelets: 284 10*3/uL (ref 150–400)
RBC: 2.8 MIL/uL — ABNORMAL LOW (ref 4.22–5.81)
RDW: 13.3 % (ref 11.5–15.5)
WBC: 8.2 10*3/uL (ref 4.0–10.5)
nRBC: 0 % (ref 0.0–0.2)

## 2021-01-04 LAB — BASIC METABOLIC PANEL
Anion gap: 6 (ref 5–15)
BUN: 26 mg/dL — ABNORMAL HIGH (ref 8–23)
CO2: 26 mmol/L (ref 22–32)
Calcium: 8 mg/dL — ABNORMAL LOW (ref 8.9–10.3)
Chloride: 104 mmol/L (ref 98–111)
Creatinine, Ser: 1.14 mg/dL (ref 0.61–1.24)
GFR, Estimated: >60 mL/min (ref >60)
Glucose, Bld: 131 mg/dL — ABNORMAL HIGH (ref 70–99)
Potassium: 4 mmol/L (ref 3.5–5.1)
Sodium: 136 mmol/L (ref 135–145)

## 2021-01-04 LAB — GLUCOSE, CAPILLARY
Glucose-Capillary: 166 mg/dL — ABNORMAL HIGH (ref 70–99)
Glucose-Capillary: 140 mg/dL — ABNORMAL HIGH (ref 70–99)
Glucose-Capillary: 193 mg/dL — ABNORMAL HIGH (ref 70–99)

## 2021-01-04 LAB — FERRITIN: Ferritin: 50 ng/mL (ref 24–336)

## 2021-01-04 MED ORDER — POLYETHYLENE GLYCOL 3350 17 G PO PACK
17.0000 g | PACK | Freq: Every day | ORAL | Status: DC | PRN
  Administered 2021-01-05: 17 g via ORAL
  Filled 2021-01-04: qty 1

## 2021-01-04 NOTE — TOC Progression Note (Signed)
Transition of Care [facility]) - Progression Note    Patient Details  Name: Brad Santiago MRN: [id number] Date of Birth: 22-Jan-1941  Transition of Care [facility]) CM/SW Contact  Barrie Dunker, RN Phone Number: 01/04/2021, 9:45 AM  Clinical Narrative:   Reached out to [facility] to inquire if they have gotten auth approval and if the patient can DC to their facility today of medically cleared, awaiting a responce         Expected Discharge Plan and Services                                                 Social Determinants of Health (SDOH) Interventions    Readmission Risk Interventions No flowsheet data found.

## 2021-01-04 NOTE — Progress Notes (Signed)
Physical Therapy Treatment Patient Details Name: Brad Santiago MRN: [id number] DOB: 19-Dec-1940 Today's Date: 01/04/2021    History of Present Illness Brad Santiago is a 80 y.o. male with medical history significant for hypertension, non-insulin-dependent diabetes mellitus, hypothyroid, hyperlipidemia, peripheral neuropathy, presents to the emergency department for chief concerns of passing out. Patient states he was sitting down on his chair when he felt weak in the last thing he remembered was seeing the coffee table about to catch his chin as he fell forward. He woke up on the floor.  He states he does not remember anything else.  Patient sustained a bimalleolar L ankle fracture. S/P external fixation.    PT Comments    Pt in bed.  Stated he was sitting EOB earlier today but has not been up to recliner and is not interested at this time.  Offered to practice standing with walker and +2 assist when OT entered room during session but he continued to decline.  Did do some RLE AROM but pt very talkative/joking with Clinical research associate and not staying on task.  Seemed to use talking/jokes as a way of delaying and getting out of activity.  After several attempts to re-direct pt session was stopped and he is encouraged to do HEP on his own as able.     Follow Up Recommendations  SNF     Equipment Recommendations  None recommended by PT;Other (comment)    Recommendations for Other Services       Precautions / Restrictions Precautions Precautions: Fall Required Braces or Orthoses: Other Brace (External fixator L ankle) Other Brace: external fixator Restrictions Weight Bearing Restrictions: Yes LLE Weight Bearing: Non weight bearing    Mobility  Bed Mobility               General bed mobility comments: refused    Transfers                    Ambulation/Gait                 Stairs             Wheelchair Mobility    Modified Rankin (Stroke Patients Only)        Balance                                            Cognition Arousal/Alertness: Awake/alert Behavior During Therapy: WFL for tasks assessed/performed Overall Cognitive Status: Within Functional Limits for tasks assessed                                        Exercises Other Exercises Other Exercises: limited RLE AROM    General Comments        Pertinent Vitals/Pain Pain Assessment: Faces Faces Pain Scale: Hurts a little bit Pain Location: L LE Pain Descriptors / Indicators: Discomfort;Sore Pain Intervention(s): Limited activity within patient's tolerance;Monitored during session    Home Living                      Prior Function            PT Goals (current goals can now be found in the care plan section) Progress towards PT goals: Not progressing toward goals - comment  Frequency    7X/week      PT Plan Current plan remains appropriate    Co-evaluation              AM-PAC PT "6 Clicks" Mobility   Outcome Measure  Help needed turning from your back to your side while in a flat bed without using bedrails?: A Little Help needed moving from lying on your back to sitting on the side of a flat bed without using bedrails?: A Little Help needed moving to and from a bed to a chair (including a wheelchair)?: A Lot Help needed standing up from a chair using your arms (e.g., wheelchair or bedside chair)?: A Lot Help needed to walk in hospital room?: Total Help needed climbing 3-5 steps with a railing? : Total 6 Click Score: 12    End of Session   Activity Tolerance: Other (comment);Patient limited by fatigue Patient left: with call bell/phone within reach;in bed;with bed alarm set Nurse Communication: Mobility status PT Visit Diagnosis: Other abnormalities of gait and mobility (R26.89);Muscle weakness (generalized) (M62.81);History of falling (Z91.81)     Time: 5397-6734 PT Time Calculation (min) (ACUTE  ONLY): 10 min  Charges:  $Therapeutic Exercise: 8-22 mins                    Danielle Dess, PTA 01/04/21, 2:49 PM

## 2021-01-04 NOTE — Progress Notes (Signed)
OT Cancellation Note  Patient Details Name: Brad Santiago MRN: [id number] DOB: 11-14-1940   Cancelled Treatment:    Reason Eval/Treat Not Completed: Patient declined, no reason specified. Upon attempt, pt working with PTA. Pt declines attempts for OOB with PT/OT despite encouragement. Will re-attempt OT tx at later date/time as pt is agreeable and medically appropriate.   Wynona Canes, MPH, MS, OTR/L ascom [phone] 01/04/21, 1:53 PM

## 2021-01-04 NOTE — Progress Notes (Signed)
Nutrition Follow-up  DOCUMENTATION CODES:  Obesity unspecified  INTERVENTION:   Continue current diet as ordered  Ensure Enlive po BID, each supplement provides 350 kcal and 20 grams of protein  NUTRITION DIAGNOSIS:  Increased nutrient needs related to post-op healing as evidenced by estimated needs.  GOAL:  Patient will meet greater than or equal to 90% of their needs  MONITOR:  PO intake,Supplement acceptance  REASON FOR ASSESSMENT:  Consult Hip fracture protocol  ASSESSMENT:  Pt presented to ED after passing out at home. Found to be hypoglycemic in ED and imaging suggestive of an ankle fracture. At baseline, lives at home with his 2 cats and uses a walker to ambulate. PMH includes HTN, DM type 2, hypothyroid, HLD.  Pt on phone at first attempted visit and with physician at second. Review intake since surgery, appears to be improving and pt is consistently consumed all meals. 4 out of 7 previous meals have been 100% consumed. Will request new weight to monitor for changes and continue current interventions. CM working on SNF dc when medically able.   4/26 - Op, Left ankle spanning external fixator  Average Meal Intake: . 4/26-5/1: 58% intake x 13 recorded meals  Relevant Scheduled Meds: . atorvastatin  80 mg Oral QHS  . insulin aspart  0-5 Units Subcutaneous QHS  . insulin aspart  0-9 Units Subcutaneous TID WC  . levothyroxine  25 mcg Oral Q0600  . pioglitazone  30 mg Oral Daily   Relevant PRN Meds: ondansetron  Labs reviewed:  BUN 26  SBG ranges from 126-131 mg/dL over the last 24 hours  Last HgbA1c 6.1 (4/24)  NUTRITION - FOCUSED PHYSICAL EXAM: Flowsheet Row Most Recent Value  Orbital Region No depletion  Upper Arm Region No depletion  Thoracic and Lumbar Region No depletion  Buccal Region No depletion  Temple Region No depletion  Clavicle Bone Region No depletion  Clavicle and Acromion Bone Region No depletion  Scapular Bone Region No depletion   Dorsal Hand Mild depletion  Patellar Region No depletion  Anterior Thigh Region No depletion  Posterior Calf Region No depletion  Edema (RD Assessment) Mild  Hair Reviewed  Eyes Reviewed  Mouth Reviewed  Skin Reviewed  Nails Reviewed     Diet Order:   Diet Order            Diet Carb Modified Fluid consistency: Thin; Room service appropriate? Yes  Diet effective now                EDUCATION NEEDS:  No education needs have been identified at this time  Skin:  Skin Assessment: Skin Integrity Issues: Skin Integrity Issues:: Other (Comment) Other: abraisions to the right and left legs  Last BM:  4/24 per RN documentation, type 4  Height:  Ht Readings from Last 1 Encounters:  12/27/20 6' (1.829 m)    Weight:  Wt Readings from Last 1 Encounters:  12/27/20 104.3 kg    Ideal Body Weight:  80.9 kg  BMI:  Body mass index is 31.19 kg/m.  Estimated Nutritional Needs:   Kcal:  1900-2100 kcal/d  Protein:  95-105 g  Fluid:  >2L/d   Greig Castilla, RD, LDN Clinical Dietitian Pager on Amion

## 2021-01-04 NOTE — Progress Notes (Signed)
Patient ID: Brad Santiago, male   DOB: Aug 29, 1941, 80 y.o.   MRN: [id number] [facility] PROGRESS NOTE  Brad Santiago PFX:[id number] DOB: 1941/04/27 DOA: 12/27/2020 PCP: Dorothey Baseman, MD  HPI/Subjective: Patient still has itching and rash on his back.  Still having some ankle pain.  Otherwise offers no complaints.  Came in with hypoglycemia and found to have a ankle fracture.  Objective: Vitals:   01/04/21 0349 01/04/21 0837  BP: (!) 110/57 (!) 111/91  Pulse: 64 72  Resp: 19 18  Temp: 98 F (36.7 C) (!) 97.4 F (36.3 C)  SpO2: 95% 96%    Intake/Output Summary (Last 24 hours) at 01/04/2021 1548 Last data filed at 01/04/2021 1410 Gross per 24 hour  Intake 600 ml  Output 850 ml  Net -250 ml   Filed Weights   12/27/20 1751  Weight: 104.3 kg    ROS: Review of Systems  Respiratory: Negative for shortness of breath.   Cardiovascular: Negative for chest pain.  Gastrointestinal: Positive for constipation. Negative for abdominal pain, nausea and vomiting.  Musculoskeletal: Positive for joint pain.  Skin: Positive for itching and rash.   Exam: Physical Exam HENT:     Head: Normocephalic.     Mouth/Throat:     Pharynx: No oropharyngeal exudate.  Eyes:     General: Lids are normal.     Conjunctiva/sclera: Conjunctivae normal.  Cardiovascular:     Rate and Rhythm: Normal rate and regular rhythm.     Heart sounds: Normal heart sounds, S1 normal and S2 normal.  Pulmonary:     Breath sounds: Normal breath sounds. No decreased breath sounds, wheezing, rhonchi or rales.  Abdominal:     Palpations: Abdomen is soft.     Tenderness: There is no abdominal tenderness.  Musculoskeletal:     Right ankle: No swelling.     Left ankle: Swelling present.  Skin:    General: Skin is warm.     Comments: Diffuse erythematous rash on his back. Cat scratches bilateral legs which are healing and have scabs on them.  Neurological:     Mental Status: He is alert and oriented to person,  place, and time.       Data Reviewed: Basic Metabolic Panel: Recent Labs  Lab 12/29/20 0525 12/30/20 0531 12/31/20 0543 01/01/21 0719 01/04/21 0433  NA 138 134* 139 138 136  K 5.3* 5.1 4.5 4.1 4.0  CL 110 106 109 107 104  CO2 22 21* 23 23 26   GLUCOSE 168* 205* 136* 126* 131*  BUN 41* 37* 35* 24* 26*  CREATININE 1.67* 1.31* 1.31* 1.11 1.14  CALCIUM 8.2* 7.8* 8.1* 8.3* 8.0*   CBC: Recent Labs  Lab 12/29/20 0525 12/30/20 0531 12/31/20 0543 01/01/21 0719 01/04/21 0433  WBC 8.4 10.4 8.7 6.5 8.2  HGB 9.0* 8.9* 8.5* 9.5* 8.8*  HCT 27.5* 27.3* 26.4* 29.3* 26.9*  MCV 99.3 97.2 97.8 96.7 96.1  PLT 194 204 229 253 284    CBG: Recent Labs  Lab 01/03/21 1157 01/03/21 1637 01/03/21 2119 01/04/21 0837 01/04/21 1121  GLUCAP 155* 135* 172* 166* 140*    Recent Results (from the past 240 hour(s))  SARS CORONAVIRUS 2 (TAT 6-24 HRS) Nasopharyngeal Nasopharyngeal Swab     Status: None   Collection Time: 12/27/20  9:16 PM   Specimen: Nasopharyngeal Swab  Result Value Ref Range Status   SARS Coronavirus 2 NEGATIVE NEGATIVE Final    Comment: (NOTE) SARS-CoV-2 target nucleic acids are NOT DETECTED.  The SARS-CoV-2  RNA is generally detectable in upper and lower respiratory specimens during the acute phase of infection. Negative results do not preclude SARS-CoV-2 infection, do not rule out co-infections with other pathogens, and should not be used as the sole basis for treatment or other patient management decisions. Negative results must be combined with clinical observations, patient history, and epidemiological information. The expected result is Negative.  Fact Sheet for Patients: HairSlick.no  Fact Sheet for Healthcare Providers: quierodirigir.com  This test is not yet approved or cleared by the [place] FDA and  has been authorized for detection and/or diagnosis of SARS-CoV-2 by FDA under an Emergency Use  Authorization (EUA). This EUA will remain  in effect (meaning this test can be used) for the duration of the COVID-19 declaration under Se ction 564(b)(1) of the Act, 21 U.S.C. section 360bbb-3(b)(1), unless the authorization is terminated or revoked sooner.  Performed at [facility], [address], [place], [place] [address]   Surgical PCR screen     Status: None   Collection Time: 12/29/20  7:45 AM   Specimen: Nasal Mucosa; Nasal Swab  Result Value Ref Range Status   MRSA, PCR NEGATIVE NEGATIVE Final   Staphylococcus aureus NEGATIVE NEGATIVE Final    Comment: (NOTE) The Xpert SA Assay (FDA approved for NASAL specimens in patients 59 years of age and older), is one component of a comprehensive surveillance program. It is not intended to diagnose infection nor to guide or monitor treatment. Performed at [facility], [address] [place], [place] [address]       Scheduled Meds: . atorvastatin  80 mg Oral QHS  . escitalopram  20 mg Oral Daily  . feeding supplement  237 mL Oral BID BM  . insulin aspart  0-5 Units Subcutaneous QHS  . insulin aspart  0-9 Units Subcutaneous TID WC  . levothyroxine  25 mcg Oral Q0600  . pioglitazone  30 mg Oral Daily  . triamcinolone cream   Topical TID    Assessment/Plan:   1. Closed left ankle fracture.  Dr. Okey Dupre on 12/29/2020 placed external hardware to stabilize fracture.  He will reevaluate the skin today.  He initially recommended 7 to 10 days for skin tear improved prior to definitive operative procedure.  Transitional care team notified me that apparently the rehab facility never sent out the authorization to go out to rehab. 2. Bilateral lower extremity cat scratches.  Finished up 5 days of Zithromax. 3. Erythematous rash on the back with itching likely contact dermatitis from sweating and being in the bed.  Triamcinolone cream 3 times a day and Atarax as needed. 4. Type 2 diabetes mellitus with hyperlipidemia.   Patient had hypoglycemia at home secondary to glipizide.  Hemoglobin A1c 6.1.  Restarted Actos only.  Continue atorvastatin. 5. Hyperkalemia resolved 6. Acute kidney injury on chronic kidney disease stage II.  Creatinine was 1.74 on presentation.  Creatinine 1.14 today. 7. Anemia.  Add on a ferritin to labs.  Hemoglobin stable.    Code Status:     Code Status Orders  (From admission, onward)         Start     Ordered   12/27/20 2012  Full code  Continuous        12/27/20 2013        Code Status History    This patient has a current code status but no historical code status.   Advance Care Planning Activity     Family Communication: Spoke  with son yesterday Disposition Plan: Status is: Inpatient  Dispo: The patient is from: Home              Anticipated d/c is to: Rehab              Patient currently has an ankle fracture with external hardware.  Dr. Okey Dupre to assess skin today to see when surgery would be.  Apparently rehab facility did not send out the authorization to the insurance company last week.   Difficult to place patient.  Yes.  Time spent: 27 minutes  Earleen Aoun [employer]

## 2021-01-04 NOTE — Plan of Care (Signed)

## 2021-01-04 NOTE — Progress Notes (Signed)
No acute events overnight, vital signs stable. Wound and pin site care done.   Affected limb remains swollen elevated on 4 pillows and makeshift to raise leg . Observation continues.

## 2021-01-05 DIAGNOSIS — E875 Hyperkalemia: Secondary | ICD-10-CM | POA: Diagnosis not present

## 2021-01-05 DIAGNOSIS — S82892A Other fracture of left lower leg, initial encounter for closed fracture: Secondary | ICD-10-CM | POA: Diagnosis not present

## 2021-01-05 DIAGNOSIS — R21 Rash and other nonspecific skin eruption: Secondary | ICD-10-CM | POA: Diagnosis not present

## 2021-01-05 DIAGNOSIS — D509 Iron deficiency anemia, unspecified: Secondary | ICD-10-CM

## 2021-01-05 DIAGNOSIS — E1169 Type 2 diabetes mellitus with other specified complication: Secondary | ICD-10-CM | POA: Diagnosis not present

## 2021-01-05 LAB — GLUCOSE, CAPILLARY
Glucose-Capillary: 154 mg/dL — ABNORMAL HIGH (ref 70–99)
Glucose-Capillary: 158 mg/dL — ABNORMAL HIGH (ref 70–99)
Glucose-Capillary: 155 mg/dL — ABNORMAL HIGH (ref 70–99)
Glucose-Capillary: 145 mg/dL — ABNORMAL HIGH (ref 70–99)
Glucose-Capillary: 178 mg/dL — ABNORMAL HIGH (ref 70–99)

## 2021-01-05 MED ORDER — FERROUS SULFATE 325 (65 FE) MG PO TABS
325.0000 mg | ORAL_TABLET | Freq: Two times a day (BID) | ORAL | Status: DC
  Administered 2021-01-05 – 2021-01-11 (×11): 325 mg via ORAL
  Filled 2021-01-05 (×11): qty 1

## 2021-01-05 MED ORDER — ENOXAPARIN SODIUM 30 MG/0.3ML IJ SOSY
30.0000 mg | PREFILLED_SYRINGE | INTRAMUSCULAR | Status: DC
  Administered 2021-01-05 – 2021-01-08 (×4): 30 mg via SUBCUTANEOUS
  Filled 2021-01-05 (×4): qty 0.3

## 2021-01-05 NOTE — Progress Notes (Signed)
Physical Therapy Treatment Patient Details Name: Brad Santiago MRN: [id number] DOB: 06-15-1941 Today's Date: 01/05/2021    History of Present Illness Brad Santiago is a 80 y.o. male with medical history significant for hypertension, non-insulin-dependent diabetes mellitus, hypothyroid, hyperlipidemia, peripheral neuropathy, presents to the emergency department for chief concerns of passing out. Patient states he was sitting down on his chair when he felt weak in the last thing he remembered was seeing the coffee table about to catch his chin as he fell forward. He woke up on the floor.  He states he does not remember anything else.  Patient sustained a bimalleolar L ankle fracture. S/P external fixation.    PT Comments    Patient received in bed, reports he is still itching. Rash appears improved from last week. Patient is agreeable to PT session. Performed B LE exercises in bed with min guard assist on left. Patient reports pain as minimal. Patient performed supine to sit with mod independence, use of bed rail and HOB elevated. He performed lateral scoot transfer to recliner with min assist. Patient will continue to benefit from skilled PT to improve strength and functional independence.      Follow Up Recommendations  SNF;Supervision for mobility/OOB     Equipment Recommendations  None recommended by PT;Other (comment) (TBD)    Recommendations for Other Services       Precautions / Restrictions Precautions Precautions: Fall Other Brace: external fixator Restrictions Weight Bearing Restrictions: Yes LLE Weight Bearing: Non weight bearing    Mobility  Bed Mobility Overal bed mobility: Modified Independent Bed Mobility: Supine to Sit     Supine to sit: Modified independent (Device/Increase time);HOB elevated          Transfers Overall transfer level: Needs assistance   Transfers: Lateral/Scoot Transfers          Lateral/Scoot Transfers: Min assist General transfer  comment: min assist for scooting, managing pad during scoot.  Ambulation/Gait                 Stairs             Wheelchair Mobility    Modified Rankin (Stroke Patients Only)       Balance Overall balance assessment: Needs assistance Sitting-balance support: Feet supported Sitting balance-Leahy Scale: Good                                      Cognition Arousal/Alertness: Awake/alert Behavior During Therapy: WFL for tasks assessed/performed Overall Cognitive Status: Within Functional Limits for tasks assessed                                 General Comments: Pt is A and O x 4. cooperative. likes to joke around      Exercises Total Joint Exercises Heel Slides: AAROM;Left;10 reps Hip ABduction/ADduction: AAROM;Left;10 reps Straight Leg Raises: AAROM;10 reps;Left    General Comments        Pertinent Vitals/Pain Pain Assessment: No/denies pain Pain Intervention(s): Monitored during session    Home Living                      Prior Function            PT Goals (current goals can now be found in the care plan section) Acute Rehab PT Goals Patient Stated Goal: to  get better PT Goal Formulation: With patient Time For Goal Achievement: 01/12/21 Potential to Achieve Goals: Fair Progress towards PT goals: Progressing toward goals    Frequency    Min 2X/week      PT Plan Frequency needs to be updated    Co-evaluation              AM-PAC PT "6 Clicks" Mobility   Outcome Measure  Help needed turning from your back to your side while in a flat bed without using bedrails?: A Little Help needed moving from lying on your back to sitting on the side of a flat bed without using bedrails?: A Little Help needed moving to and from a bed to a chair (including a wheelchair)?: A Little Help needed standing up from a chair using your arms (e.g., wheelchair or bedside chair)?: A Lot Help needed to walk in  hospital room?: Total Help needed climbing 3-5 steps with a railing? : Total 6 Click Score: 13    End of Session   Activity Tolerance: Patient tolerated treatment well Patient left: in chair;with bed alarm set Nurse Communication: Mobility status PT Visit Diagnosis: Other abnormalities of gait and mobility (R26.89);Muscle weakness (generalized) (M62.81);History of falling (Z91.81)     Time: 1335-1405 PT Time Calculation (min) (ACUTE ONLY): 30 min  Charges:  $Therapeutic Exercise: 8-22 mins $Therapeutic Activity: 8-22 mins                     Lathon Adan, PT, GCS 01/05/21,2:18 PM

## 2021-01-05 NOTE — Progress Notes (Signed)
Patient ID: Brad Santiago [place], male   DOB: [birth year], 80 y.o.   MRN: [id number] [facility] PROGRESS NOTE  Brad Santiago DDU:[id number] DOB: Jan 07, 1941 DOA: 12/27/2020 PCP: Dorothey Baseman, MD  HPI/Subjective: Patient feels okay.  Offers no complaints.  Still has a rash on his back and some itching.  Came in with fracture and has external hardware.  Objective: Vitals:   01/05/21 0350 01/05/21 0926  BP: (!) 107/58 (!) 112/48  Pulse: 64 70  Resp: 17 16  Temp: 98 F (36.7 C) 97.7 F (36.5 C)  SpO2: 97% 98%    Filed Weights   12/27/20 1751 01/05/21 0407  Weight: 104.3 kg 102.5 kg    ROS: Review of Systems  Respiratory: Negative for shortness of breath.   Cardiovascular: Negative for chest pain.  Gastrointestinal: Negative for abdominal pain, nausea and vomiting.  Skin: Positive for rash.   Exam: Physical Exam HENT:     Head: Normocephalic.     Mouth/Throat:     Pharynx: No oropharyngeal exudate.  Eyes:     General: Lids are normal.     Conjunctiva/sclera: Conjunctivae normal.     Pupils: Pupils are equal, round, and reactive to light.  Cardiovascular:     Rate and Rhythm: Normal rate and regular rhythm.     Heart sounds: Normal heart sounds, S1 normal and S2 normal.  Pulmonary:     Breath sounds: No decreased breath sounds, wheezing, rhonchi or rales.  Abdominal:     Palpations: Abdomen is soft.     Tenderness: There is no abdominal tenderness.  Musculoskeletal:     Right lower leg: No swelling.     Left lower leg: No swelling.  Skin:    General: Skin is warm.     Findings: Rash present.     Comments: Fading erythematous rash on the back  Neurological:     Mental Status: He is alert and oriented to person, place, and time.       Data Reviewed: Basic Metabolic Panel: Recent Labs  Lab 12/30/20 0531 12/31/20 0543 01/01/21 0719 01/04/21 0433  NA 134* 139 138 136  K 5.1 4.5 4.1 4.0  CL 106 109 107 104  CO2 21* 23 23 26   GLUCOSE 205* 136* 126* 131*  BUN  37* 35* 24* 26*  CREATININE 1.31* 1.31* 1.11 1.14  CALCIUM 7.8* 8.1* 8.3* 8.0*   CBC: Recent Labs  Lab 12/30/20 0531 12/31/20 0543 01/01/21 0719 01/04/21 0433  WBC 10.4 8.7 6.5 8.2  HGB 8.9* 8.5* 9.5* 8.8*  HCT 27.3* 26.4* 29.3* 26.9*  MCV 97.2 97.8 96.7 96.1  PLT 204 229 253 284     CBG: Recent Labs  Lab 01/04/21 1121 01/04/21 1615 01/04/21 2103 01/05/21 0844 01/05/21 1202  GLUCAP 140* 154* 193* 158* 155*    Recent Results (from the past 240 hour(s))  SARS CORONAVIRUS 2 (TAT 6-24 HRS) Nasopharyngeal Nasopharyngeal Swab     Status: None   Collection Time: 12/27/20  9:16 PM   Specimen: Nasopharyngeal Swab  Result Value Ref Range Status   SARS Coronavirus 2 NEGATIVE NEGATIVE Final    Comment: (NOTE) SARS-CoV-2 target nucleic acids are NOT DETECTED.  The SARS-CoV-2 RNA is generally detectable in upper and lower respiratory specimens during the acute phase of infection. Negative results do not preclude SARS-CoV-2 infection, do not rule out co-infections with other pathogens, and should not be used as the sole basis for treatment or other patient management decisions. Negative results must be combined with clinical  observations, patient history, and epidemiological information. The expected result is Negative.  Fact Sheet for Patients: HairSlick.no  Fact Sheet for Healthcare Providers: quierodirigir.com  This test is not yet approved or cleared by the [place] FDA and  has been authorized for detection and/or diagnosis of SARS-CoV-2 by FDA under an Emergency Use Authorization (EUA). This EUA will remain  in effect (meaning this test can be used) for the duration of the COVID-19 declaration under Se ction 564(b)(1) of the Act, 21 U.S.C. section 360bbb-3(b)(1), unless the authorization is terminated or revoked sooner.  Performed at [facility], [address], [place], [place] [address]    Surgical PCR screen     Status: None   Collection Time: 12/29/20  7:45 AM   Specimen: Nasal Mucosa; Nasal Swab  Result Value Ref Range Status   MRSA, PCR NEGATIVE NEGATIVE Final   Staphylococcus aureus NEGATIVE NEGATIVE Final    Comment: (NOTE) The Xpert SA Assay (FDA approved for NASAL specimens in patients 75 years of age and older), is one component of a comprehensive surveillance program. It is not intended to diagnose infection nor to guide or monitor treatment. Performed at [facility], [address] [place], [place] [address]      Scheduled Meds: . atorvastatin  80 mg Oral QHS  . escitalopram  20 mg Oral Daily  . feeding supplement  237 mL Oral BID BM  . insulin aspart  0-5 Units Subcutaneous QHS  . insulin aspart  0-9 Units Subcutaneous TID WC  . levothyroxine  25 mcg Oral Q0600  . pioglitazone  30 mg Oral Daily  . triamcinolone cream   Topical TID   Brief history.  Patient admitted 12/27/2020 with syncope and hypoglycemic episode and found to have a left ankle fracture.  On 12/29/2020 patient had external fixator to the left ankle by Dr. Okey Dupre.  He wanted to wait 7 to 10 days for ORIF procedures secondary to blisters on the skin.  Unfortunately the rehab facility did not send out the authorization so I did not get authorization on Friday afternoon.  On reevaluation Dr. Okey Dupre states the skin looks better and will put him on the schedule for this Friday for ORIF.  Will need rehab afterwards.  Past medical history of diabetes, hyperlipidemia, depression, hypothyroidism.  Assessment/Plan:  1. Closed left ankle fracture.  On 12/29/2020 patient had external hardware placed to stabilize fracture by Dr. Okey Dupre.  Initially recommended 7 to 10 days for skin to improve prior to definitive procedure.  We did not get insurance authorization on Friday.  Dr. Okey Dupre evaluated the skin and he will put on the schedule for ORIF on 12/09/2020.  Patient will need rehab  afterwards. 2. Bilateral lower extremity cat scratches.  Finished up 5 days of Zithromax. 3. Erythematous rash on the back with itching.  Likely contact dermatitis from sweating and being in the bed.  Improving with triamcinolone 3 times a day and Atarax as needed for itching. 4. Type 2 diabetes mellitus with hyperlipidemia.  Patient had a hypoglycemic episode at home secondary to glipizide.  Patient's hemoglobin A1c is good at 6.1.  Restart Actos only.  Continue atorvastatin. 5. Hyperkalemia.  This has resolved secondary to acute kidney injury. 6. Acute kidney injury on chronic kidney disease stage II.  Creatinine was 1.74 on presentation and down to 1.14 on last check. 7. Anemia iron deficiency.  Add ferrous sulfate. 8. Hypothyroidism unspecified on levothyroxine      Code Status:  Code Status Orders  (From admission, onward)         Start     Ordered   12/27/20 2012  Full code  Continuous        12/27/20 2013        Code Status History    This patient has a current code status but no historical code status.   Advance Care Planning Activity     Family Communication: Spoke with son on the phone Disposition Plan: Status is: Inpatient  Dispo: The patient is from: Home              Anticipated d/c is to: Rehab              Patient currently for ORIF left ankle on Friday   Difficult to place patient.  No.  Time spent: 28 minutes  Betzabeth Derringer [employer]

## 2021-01-05 NOTE — Plan of Care (Signed)
  Problem: Education: Goal: Knowledge of General Education information will improve Description: Including pain rating scale, medication(s)/side effects and non-pharmacologic comfort measures Outcome: Not Progressing   Problem: Health Behavior/Discharge Planning: Goal: Ability to manage health-related needs will improve Outcome: Not Progressing   Problem: Clinical Measurements: Goal: Ability to maintain clinical measurements within normal limits will improve Outcome: Not Progressing Goal: Will remain free from infection Outcome: Not Progressing Goal: Diagnostic test results will improve Outcome: Not Progressing Goal: Respiratory complications will improve Outcome: Not Progressing Goal: Cardiovascular complication will be avoided Outcome: Not Progressing   Problem: Activity: Goal: Risk for activity intolerance will decrease Outcome: Not Progressing   Problem: Nutrition: Goal: Adequate nutrition will be maintained Outcome: Not Progressing   Problem: Coping: Goal: Level of anxiety will decrease Outcome: Not Progressing   Problem: Safety: Goal: Ability to remain free from injury will improve Outcome: Not Progressing   Problem: Pain Managment: Goal: General experience of comfort will improve Outcome: Not Progressing   Problem: Skin Integrity: Goal: Risk for impaired skin integrity will decrease Outcome: Not Progressing

## 2021-01-05 NOTE — Care Management Important Message (Signed)
Important Message  Patient Details  Name: Brad Santiago MRN: [id number] Date of Birth: Jan 08, 1941   Medicare Important Message Given:  Yes  Patient was sleeping so I left a copy of the Important Message from Medicare on his bedside table to review at his convenience.  Olegario Messier A Jaquel Coomer 01/05/2021, 10:24 AM

## 2021-01-05 NOTE — Progress Notes (Signed)
OT Cancellation Note  Patient Details Name: Brad Santiago MRN: [id number] DOB: 03-07-1941   Cancelled Treatment:    Reason Eval/Treat Not Completed: Other (comment). Upon attempt, pt with PT. Will re-attempt at later date/time as available and appropriate.   Wynona Canes, MPH, MS, OTR/L ascom [phone] 01/05/21, 2:37 PM

## 2021-01-05 NOTE — Progress Notes (Signed)
  Subjective:  Patient reports appropriate pain control. He has been getting pin site care. No concerns at this time.  Objective:   VITALS:   Vitals:   01/05/21 0022 01/05/21 0350 01/05/21 0407 01/05/21 0926  BP: (!) 115/58 (!) 107/58  (!) 112/48  Pulse: 64 64  70  Resp: 16 17  16   Temp: 98.4 F (36.9 C) 98 F (36.7 C)  97.7 F (36.5 C)  TempSrc: Oral     SpO2: 98% 97%  98%  Weight:   102.5 kg   Height:        PHYSICAL EXAM:  General: alert, comfortable  Left lower extremity: ex-fix in place, pin sites clean and dry, areas of skin blistering are improved, medial blister still with mild drainage, swelling overall improved, skin nearly able to wrinkle, NVI  LABS  Results for orders placed or performed during the hospital encounter of 12/27/20 (from the past 24 hour(s))  Glucose, capillary     Status: Abnormal   Collection Time: 01/04/21  4:15 PM  Result Value Ref Range   Glucose-Capillary 154 (H) 70 - 99 mg/dL  Glucose, capillary     Status: Abnormal   Collection Time: 01/04/21  9:03 PM  Result Value Ref Range   Glucose-Capillary 193 (H) 70 - 99 mg/dL  Glucose, capillary     Status: Abnormal   Collection Time: 01/05/21  8:44 AM  Result Value Ref Range   Glucose-Capillary 158 (H) 70 - 99 mg/dL  Glucose, capillary     Status: Abnormal   Collection Time: 01/05/21 12:02 PM  Result Value Ref Range   Glucose-Capillary 155 (H) 70 - 99 mg/dL    No results found.  Assessment/Plan: 7 Days Post-Ops/p L ankle ex-fix placement 12/29/20, swelling and blistering are improving  - Will plan for ex-fix removal and ORIF L ankle fracture on Friday 5/6 - PT/OT: NWB LLE - Continue DVT ppx: ASA 325mg  - Continue wound care and pin site care - NPO Thursday at midnight    , MD 01/05/2021, 12:52 PM

## 2021-01-06 DIAGNOSIS — R55 Syncope and collapse: Secondary | ICD-10-CM | POA: Diagnosis not present

## 2021-01-06 DIAGNOSIS — S82852A Displaced trimalleolar fracture of left lower leg, initial encounter for closed fracture: Secondary | ICD-10-CM

## 2021-01-06 DIAGNOSIS — N179 Acute kidney failure, unspecified: Secondary | ICD-10-CM | POA: Diagnosis not present

## 2021-01-06 DIAGNOSIS — W5503XA Scratched by cat, initial encounter: Secondary | ICD-10-CM | POA: Diagnosis not present

## 2021-01-06 LAB — GLUCOSE, CAPILLARY
Glucose-Capillary: 120 mg/dL — ABNORMAL HIGH (ref 70–99)
Glucose-Capillary: 126 mg/dL — ABNORMAL HIGH (ref 70–99)
Glucose-Capillary: 158 mg/dL — ABNORMAL HIGH (ref 70–99)
Glucose-Capillary: 157 mg/dL — ABNORMAL HIGH (ref 70–99)

## 2021-01-06 NOTE — Progress Notes (Signed)
Physical Therapy Treatment Patient Details Name: Dustine Stickler Santiago MRN: [id number] DOB: [birth year] Today's Date: 01/06/2021    History of Present Illness Brad Santiago is a 80 y.o. male with medical history significant for hypertension, non-insulin-dependent diabetes mellitus, hypothyroid, hyperlipidemia, peripheral neuropathy, presents to the emergency department for chief concerns of passing out. Patient states he was sitting down on his chair when he felt weak in the last thing he remembered was seeing the coffee table about to catch his chin as he fell forward. He woke up on the floor.  He states he does not remember anything else.  Patient sustained a bimalleolar L ankle fracture. S/P external fixation.    PT Comments    Pt was supine in bed with HOB elevated ~ 10 degrees upon arriving. He agrees to PT session and is cooperative and pleasant throughout. He was requesting not to get OOB today but did agree to there ex in bed and at EOB. See there ex perform listed below. Pt has been progressing well with PT since admission. Limited by wt bearing. Pt is scheduled for OR Friday. Will continue to follow and progress per POC.    Follow Up Recommendations  SNF;Supervision for mobility/OOB     Equipment Recommendations  None recommended by PT;Other (comment)       Precautions / Restrictions Precautions Precautions: Fall Required Braces or Orthoses: Other Brace Other Brace: external fixator Restrictions Weight Bearing Restrictions: Yes LLE Weight Bearing: Non weight bearing    Mobility  Bed Mobility Overal bed mobility: Modified Independent Bed Mobility: Supine to Sit     Supine to sit: Modified independent (Device/Increase time);HOB elevated Sit to supine: Supervision   General bed mobility comments: HOB only elevated ~ 5-10 degrees. increased time to perform. transitioned to R side EOB. performed seated EOB exercises prior to returning to supine in bed    Transfers        General  transfer comment: Pt requested not to get OOB 2/2 to "feeling sleepy."      Balance Overall balance assessment: Needs assistance Sitting-balance support: Feet supported Sitting balance-Leahy Scale: Good         Cognition Arousal/Alertness: Awake/alert Behavior During Therapy: WFL for tasks assessed/performed Overall Cognitive Status: Within Functional Limits for tasks assessed      General Comments: Pt is A and O x 4. cooperative. likes to joke around      Exercises General Exercises - Lower Extremity Quad Sets: AROM;10 reps Gluteal Sets: AROM;10 reps Long Arc Quad: Seated;AROM;10 reps Hip ABduction/ADduction: AROM;10 reps Straight Leg Raises: AAROM;10 reps Hip Flexion/Marching: Seated;10 reps        Pertinent Vitals/Pain Pain Assessment: No/denies pain Pain Score: 0-No pain           PT Goals (current goals can now be found in the care plan section) Acute Rehab PT Goals Patient Stated Goal: to get better Progress towards PT goals: Progressing toward goals    Frequency    Min 2X/week      PT Plan Frequency needs to be updated    Co-evaluation     PT goals addressed during session: Mobility/safety with mobility;Balance;Proper use of DME;Strengthening/ROM        AM-PAC PT "6 Clicks" Mobility   Outcome Measure  Help needed turning from your back to your side while in a flat bed without using bedrails?: A Little Help needed moving from lying on your back to sitting on the side of a flat bed without using bedrails?: A Little  Help needed moving to and from a bed to a chair (including a wheelchair)?: A Little Help needed standing up from a chair using your arms (e.g., wheelchair or bedside chair)?: A Lot Help needed to walk in hospital room?: Total Help needed climbing 3-5 steps with a railing? : Total 6 Click Score: 13    End of Session   Activity Tolerance: Patient tolerated treatment well Patient left: in bed;with call bell/phone within  reach;with bed alarm set Nurse Communication: Mobility status PT Visit Diagnosis: Other abnormalities of gait and mobility (R26.89);Muscle weakness (generalized) (M62.81);History of falling (Z91.81)     Time: 7619-5093 PT Time Calculation (min) (ACUTE ONLY): 13 min  Charges:  $Therapeutic Exercise: 8-22 mins                     Jetta Lout PTA 01/06/21, 3:04 PM

## 2021-01-06 NOTE — Progress Notes (Signed)
PROGRESS NOTE    Brad Santiago  TFT:[id number] DOB: 09/09/1940 DOA: 12/27/2020 PCP: Dorothey Baseman, MD   Brief Narrative: Taken from prior notes. Patient admitted 12/27/2020 with syncope and hypoglycemic episode and found to have a left ankle fracture.  On 12/29/2020 patient had external fixator to the left ankle by Dr. Okey Dupre.  He wanted to wait 7 to 10 days for ORIF procedures secondary to blisters on the skin, which started improving and now going for ORIF on 01/08/2021. Patient had a bed offer at [facility].  Subjective: Patient was seen and examined today.  According to patient pain is bearable.  No other complaints.  Assessment & Plan:   Active Problems:   Closed left ankle fracture   Type 2 diabetes mellitus with hyperlipidemia (HCC)   Essential hypertension   Hyperlipidemia   Hypothyroidism   Acute kidney injury superimposed on CKD (HCC)   Acute metabolic encephalopathy   Cat scratch   Hyperkalemia   Depression   Weakness   Rash   Anemia  Closed left ankle fracture.  On 12/29/2020 patient had external hardware placed to stabilize fracture by Dr. Okey Dupre.  Initially recommended 7 to 10 days for skin to improve prior to definitive procedure.  Now ORIF is planned for 01/08/2021 as skin started improving. -Continue with pain management  Bilateral lower extremity cat scratches.  Patient received 5 days of Zithromax.  Erythematous rash on the back with itching.  Improving with triamcinolone and Atarax. Most likely secondary to contact dermatitis and excessive sweating.  Type 2 diabetes mellitus with hyperlipidemia.  A1c of 6.1. -Continue home dose of Actos -Continue with atorvastatin  AKI with CKD stage II.  Resolved.  Creatinine at baseline. -Monitor renal function. -Avoid nephrotoxins  Iron deficiency anemia. -Continue iron supplement  Hypothyroidism. -Continue levothyroxine   Objective: Vitals:   01/06/21 0004 01/06/21 0540 01/06/21 1249 01/06/21 1543  BP:  (!) 120/55 112/62 (!) 104/54 (!) 110/54  Pulse: 65 60 (!) 58 64  Resp: 18 17 18 16   Temp: 98 F (36.7 C) 97.7 F (36.5 C) 98.9 F (37.2 C) 97.7 F (36.5 C)  TempSrc:  Oral    SpO2: 96% 97% 97% 98%  Weight:      Height:        Intake/Output Summary (Last 24 hours) at 01/06/2021 1755 Last data filed at 01/06/2021 1024 Gross per 24 hour  Intake 240 ml  Output --  Net 240 ml   Filed Weights   12/27/20 1751 01/05/21 0407  Weight: 104.3 kg 102.5 kg    Examination:  General exam: Appears calm and comfortable  Respiratory system: Clear to auscultation. Respiratory effort normal. Cardiovascular system: S1 & S2 heard, RRR. No JVD, murmurs, rubs, gallops or clicks. Gastrointestinal system: Soft, nontender, nondistended, bowel sounds positive. Central nervous system: Alert and oriented. No focal neurological deficits.Symmetric 5 x 5 power. Extremities: 1+ edema of left foot, no cyanosis, pulses intact and symmetrical. Psychiatry: Judgement and insight appear normal. Mood & affect appropriate.    DVT prophylaxis: Lovenox Code Status: Full Family Communication: Discussed with patient Disposition Plan:  Status is: Inpatient  Remains inpatient appropriate because:Inpatient level of care appropriate due to severity of illness   Dispo: The patient is from: Home              Anticipated d/c is to: SNF              Patient currently is not medically stable to d/c.   Difficult to place  patient No               Level of care: Med-Surg  All the records are reviewed and case discussed with Care Management/Social Worker. Management plans discussed with the patient, nursing and they are in agreement.  Consultants:   Orthopedic surgery  Procedures:  External fixator placement  Antimicrobials:   Data Reviewed: I have personally reviewed following labs and imaging studies  CBC: Recent Labs  Lab 12/31/20 0543 01/01/21 0719 01/04/21 0433  WBC 8.7 6.5 8.2  HGB 8.5* 9.5* 8.8*   HCT 26.4* 29.3* 26.9*  MCV 97.8 96.7 96.1  PLT 229 253 284   Basic Metabolic Panel: Recent Labs  Lab 12/31/20 0543 01/01/21 0719 01/04/21 0433  NA 139 138 136  K 4.5 4.1 4.0  CL 109 107 104  CO2 23 23 26   GLUCOSE 136* 126* 131*  BUN 35* 24* 26*  CREATININE 1.31* 1.11 1.14  CALCIUM 8.1* 8.3* 8.0*   GFR: Estimated Creatinine Clearance: 64 mL/min (by C-G formula based on SCr of 1.14 mg/dL). Liver Function Tests: No results for input(s): AST, ALT, ALKPHOS, BILITOT, PROT, ALBUMIN in the last 168 hours. No results for input(s): LIPASE, AMYLASE in the last 168 hours. No results for input(s): AMMONIA in the last 168 hours. Coagulation Profile: No results for input(s): INR, PROTIME in the last 168 hours. Cardiac Enzymes: No results for input(s): CKTOTAL, CKMB, CKMBINDEX, TROPONINI in the last 168 hours. BNP (last 3 results) No results for input(s): PROBNP in the last 8760 hours. HbA1C: No results for input(s): HGBA1C in the last 72 hours. CBG: Recent Labs  Lab 01/05/21 1606 01/05/21 2100 01/06/21 0837 01/06/21 1226 01/06/21 1623  GLUCAP 145* 178* 120* 126* 158*   Lipid Profile: No results for input(s): CHOL, HDL, LDLCALC, TRIG, CHOLHDL, LDLDIRECT in the last 72 hours. Thyroid Function Tests: No results for input(s): TSH, T4TOTAL, FREET4, T3FREE, THYROIDAB in the last 72 hours. Anemia Panel: Recent Labs    01/04/21 0433  FERRITIN 50   Sepsis Labs: No results for input(s): PROCALCITON, LATICACIDVEN in the last 168 hours.  Recent Results (from the past 240 hour(s))  SARS CORONAVIRUS 2 (TAT 6-24 HRS) Nasopharyngeal Nasopharyngeal Swab     Status: None   Collection Time: 12/27/20  9:16 PM   Specimen: Nasopharyngeal Swab  Result Value Ref Range Status   SARS Coronavirus 2 NEGATIVE NEGATIVE Final    Comment: (NOTE) SARS-CoV-2 target nucleic acids are NOT DETECTED.  The SARS-CoV-2 RNA is generally detectable in upper and lower respiratory specimens during the acute  phase of infection. Negative results do not preclude SARS-CoV-2 infection, do not rule out co-infections with other pathogens, and should not be used as the sole basis for treatment or other patient management decisions. Negative results must be combined with clinical observations, patient history, and epidemiological information. The expected result is Negative.  Fact Sheet for Patients: 12/29/20  Fact Sheet for Healthcare Providers: HairSlick.no  This test is not yet approved or cleared by the [place] FDA and  has been authorized for detection and/or diagnosis of SARS-CoV-2 by FDA under an Emergency Use Authorization (EUA). This EUA will remain  in effect (meaning this test can be used) for the duration of the COVID-19 declaration under Se ction 564(b)(1) of the Act, 21 U.S.C. section 360bbb-3(b)(1), unless the authorization is terminated or revoked sooner.  Performed at [facility], [address] [place], [place]   Surgical PCR screen     Status:  None   Collection Time: 12/29/20  7:45 AM   Specimen: Nasal Mucosa; Nasal Swab  Result Value Ref Range Status   MRSA, PCR NEGATIVE NEGATIVE Final   Staphylococcus aureus NEGATIVE NEGATIVE Final    Comment: (NOTE) The Xpert SA Assay (FDA approved for NASAL specimens in patients 30 years of age and older), is one component of a comprehensive surveillance program. It is not intended to diagnose infection nor to guide or monitor treatment. Performed at [facility], [address] [place], [place] [address]      Radiology Studies: No results found.  Scheduled Meds: . atorvastatin  80 mg Oral QHS  . enoxaparin (LOVENOX) injection  30 mg Subcutaneous Q24H  . escitalopram  20 mg Oral Daily  . feeding supplement  237 mL Oral BID BM  . ferrous sulfate  325 mg Oral BID WC  . insulin aspart  0-5 Units Subcutaneous QHS  . insulin aspart   0-9 Units Subcutaneous TID WC  . levothyroxine  25 mcg Oral Q0600  . pioglitazone  30 mg Oral Daily  . triamcinolone cream   Topical TID   Continuous Infusions:None   LOS: 9 days   Time spent: 35 minutes. More than 50% of the time was spent in counseling/coordination of care  Arnetha Courser, MD [facility]  If 7PM-7AM, please contact night-coverage [email or web address]  01/06/2021, 5:55 PM   This record has been created using Conservation officer, historic buildings. Errors have been sought and corrected,but may not always be located. Such creation errors do not reflect on the standard of care.

## 2021-01-07 ENCOUNTER — Inpatient Hospital Stay: Payer: Medicare HMO

## 2021-01-07 DIAGNOSIS — W5503XA Scratched by cat, initial encounter: Secondary | ICD-10-CM | POA: Diagnosis not present

## 2021-01-07 DIAGNOSIS — S82852G Displaced trimalleolar fracture of left lower leg, subsequent encounter for closed fracture with delayed healing: Secondary | ICD-10-CM

## 2021-01-07 DIAGNOSIS — D509 Iron deficiency anemia, unspecified: Secondary | ICD-10-CM | POA: Diagnosis not present

## 2021-01-07 DIAGNOSIS — N179 Acute kidney failure, unspecified: Secondary | ICD-10-CM | POA: Diagnosis not present

## 2021-01-07 LAB — GLUCOSE, CAPILLARY
Glucose-Capillary: 125 mg/dL — ABNORMAL HIGH (ref 70–99)
Glucose-Capillary: 146 mg/dL — ABNORMAL HIGH (ref 70–99)
Glucose-Capillary: 161 mg/dL — ABNORMAL HIGH (ref 70–99)
Glucose-Capillary: 195 mg/dL — ABNORMAL HIGH (ref 70–99)
Glucose-Capillary: 171 mg/dL — ABNORMAL HIGH (ref 70–99)

## 2021-01-07 MED ORDER — CEFAZOLIN SODIUM-DEXTROSE 2-4 GM/100ML-% IV SOLN
2.0000 g | INTRAVENOUS | Status: AC
  Administered 2021-01-08: 2 g via INTRAVENOUS

## 2021-01-07 MED ORDER — CEFAZOLIN (ANCEF) 1 G IV SOLR: 2.0000 g | INTRAVENOUS | Status: DC

## 2021-01-07 NOTE — Progress Notes (Signed)
Physical Therapy Treatment Patient Details Name: Brad Santiago MRN: [id number] DOB: Feb 08, 1941 Today's Date: 01/07/2021    History of Present Illness Brad Santiago is a 80 y.o. male with medical history significant for hypertension, non-insulin-dependent diabetes mellitus, hypothyroid, hyperlipidemia, peripheral neuropathy, presents to the emergency department for chief concerns of passing out. Patient states he was sitting down on his chair when he felt weak in the last thing he remembered was seeing the coffee table about to catch his chin as he fell forward. He woke up on the floor.  He states he does not remember anything else.  Patient sustained a bimalleolar L ankle fracture. S/P external fixation.    PT Comments    Pt was long sitting in bed upon arriving. He is A and oriented. " I'm going for surgery tomorrow." Pt requested to attempt to have BM on BSC. Lateral transfer to/from [facility]. Min assist required for safety. Pt has successful BM but also gets very nauseous and has small vomiting episode. RN aware. Pt returned to bed and performed several there ex (listed below). At conclusion of session, pt was in bed with LE elevated and call bell in reach. Acute PT will continue to follow and progress as able.    Follow Up Recommendations  SNF;Supervision for mobility/OOB     Equipment Recommendations  None recommended by PT;Other (comment)       Precautions / Restrictions Precautions Precautions: Fall Required Braces or Orthoses: Other Brace Other Brace: external fixator Restrictions Weight Bearing Restrictions: Yes LLE Weight Bearing: Non weight bearing    Mobility  Bed Mobility Overal bed mobility: Modified Independent Bed Mobility: Supine to Sit;Sit to Supine     Supine to sit: Supervision Sit to supine: Supervision   General bed mobility comments: HOB elevated and use of bedrails    Transfers Overall transfer level: Needs assistance Equipment used: Rolling walker (2  wheeled) Transfers: Lateral/Scoot Transfers Sit to Stand: From elevated surface;Min assist        Lateral/Scoot Transfers: Min assist General transfer comment: Pt was able to get on/off BSC but does require asistance and constant vcs for NWB  Ambulation/Gait      General Gait Details: unable/unsafe       Balance Overall balance assessment: Needs assistance Sitting-balance support: Feet supported Sitting balance-Leahy Scale: Good     Standing balance support: Bilateral upper extremity supported;During functional activity Standing balance-Leahy Scale: Fair Standing balance comment: heavy reliance on RW       Cognition Arousal/Alertness: Awake/alert Behavior During Therapy: WFL for tasks assessed/performed Overall Cognitive Status: Within Functional Limits for tasks assessed          General Comments: Pt A and O x 4. "I really want to try to get to [facility] toilet      Exercises General Exercises - Lower Extremity Quad Sets: AROM;10 reps Gluteal Sets: AROM;10 reps Long Arc Quad: Seated;AROM;10 reps Hip ABduction/ADduction: AROM;10 reps Straight Leg Raises: AAROM;10 reps Hip Flexion/Marching: Seated;10 reps        Pertinent Vitals/Pain Pain Assessment: No/denies pain Pain Score: 0-No pain           PT Goals (current goals can now be found in the care plan section) Acute Rehab PT Goals Patient Stated Goal: have surgery and then go home Progress towards PT goals: Progressing toward goals    Frequency    Min 2X/week      PT Plan Current plan remains appropriate    Co-evaluation     PT goals  addressed during session: Mobility/safety with mobility;Balance;Proper use of DME;Strengthening/ROM        AM-PAC PT "6 Clicks" Mobility   Outcome Measure  Help needed turning from your back to your side while in a flat bed without using bedrails?: A Little Help needed moving from lying on your back to sitting on the side of a flat bed without using  bedrails?: A Little Help needed moving to and from a bed to a chair (including a wheelchair)?: A Little Help needed standing up from a chair using your arms (e.g., wheelchair or bedside chair)?: A Lot Help needed to walk in hospital room?: Total Help needed climbing 3-5 steps with a railing? : Total 6 Click Score: 13    End of Session Equipment Utilized During Treatment: Gait belt Activity Tolerance: Patient tolerated treatment well Patient left: in bed;with call bell/phone within reach;with bed alarm set Nurse Communication: Mobility status PT Visit Diagnosis: Other abnormalities of gait and mobility (R26.89);Muscle weakness (generalized) (M62.81);History of falling (Z91.81)     Time: 2620-3559 PT Time Calculation (min) (ACUTE ONLY): 25 min  Charges:  $Therapeutic Exercise: 8-22 mins $Therapeutic Activity: 8-22 mins                     Jetta Lout PTA 01/07/21, 10:25 AM

## 2021-01-07 NOTE — Progress Notes (Signed)
Occupational Therapy Treatment Patient Details Name: Brad Santiago MRN: [id number] DOB: November 07, 1940 Today's Date: 01/07/2021    History of present illness Brad Santiago is a 80 y.o. male with medical history significant for hypertension, non-insulin-dependent diabetes mellitus, hypothyroid, hyperlipidemia, peripheral neuropathy, presents to the emergency department for chief concerns of passing out. Patient states he was sitting down on his chair when he felt weak in the last thing he remembered was seeing the coffee table about to catch his chin as he fell forward. He woke up on the floor.  He states he does not remember anything else.  Patient sustained a bimalleolar L ankle fracture. S/P external fixation.   OT comments  Upon entering the room, pt supine in bed and agreeable to OT intervention. Pt reports he has not been feeling well and was nauseated earlier in the day. Pt agreeable to B UE strengthening exercises with use of level 2 resistive bands. Pt performing 3 sets of 10 chest pulls with min cuing for technique. Pt then receives phone call and OT asked pt to call person back or therapist would end session. Pt asking therapist to come back at another time therefore session was terminated. Pt remains in bed with all needs within reach and bed alarm activated.     Follow Up Recommendations  SNF    Equipment Recommendations  3 in 1 bedside commode;Tub/shower seat;Other (comment)       Precautions / Restrictions Precautions Precautions: Fall Required Braces or Orthoses: Other Brace Other Brace: external fixator Restrictions Weight Bearing Restrictions: Yes LLE Weight Bearing: Non weight bearing       Mobility Bed Mobility               General bed mobility comments: HOB elevated and use of bedrails           ADL either performed or assessed with clinical judgement        Vision Patient Visual Report: No change from baseline            Cognition Arousal/Alertness:  Awake/alert Behavior During Therapy: WFL for tasks assessed/performed Overall Cognitive Status: Within Functional Limits for tasks assessed                                                     Pertinent Vitals/ Pain       Pain Assessment: Faces Faces Pain Scale: Hurts a little bit Pain Location: L LE Pain Descriptors / Indicators: Discomfort;Sore Pain Intervention(s): Monitored during session;Limited activity within patient's tolerance         Frequency  Min 1X/week        Progress Toward Goals  OT Goals(current goals can now be found in the care plan section)  Progress towards OT goals: Progressing toward goals  Acute Rehab OT Goals Patient Stated Goal: have surgery and then go home OT Goal Formulation: With patient Time For Goal Achievement: 01/13/21 Potential to Achieve Goals: Good  Plan Discharge plan remains appropriate       AM-PAC OT "6 Clicks" Daily Activity     Outcome Measure   Help from another person eating meals?: None Help from another person taking care of personal grooming?: A Little Help from another person toileting, which includes using toliet, bedpan, or urinal?: A Lot Help from another person bathing (including washing, rinsing, drying)?: A Lot  Help from another person to put on and taking off regular upper body clothing?: A Little Help from another person to put on and taking off regular lower body clothing?: A Lot 6 Click Score: 16    End of Session    OT Visit Diagnosis: Unsteadiness on feet (R26.81);Muscle weakness (generalized) (M62.81);History of falling (Z91.81)   Activity Tolerance Patient tolerated treatment well   Patient Left in bed;with call bell/phone within reach;with bed alarm set   Nurse Communication Mobility status        Time: 1610-9604 OT Time Calculation (min): 15 min  Charges: OT General Charges $OT Visit: 1 Visit OT Treatments $Therapeutic Exercise: 8-22 mins  Jackquline Denmark, MS, OTR/L  , CBIS ascom [phone]  01/07/21, 4:17 PM

## 2021-01-07 NOTE — Progress Notes (Addendum)
PROGRESS NOTE    Brad Santiago  VVO:[id number] DOB: 1941/04/09 DOA: 12/27/2020 PCP: Dorothey Baseman, MD    Chief Complaint  Patient presents with  . Ankle Pain  . Fall         Brief Narrative:  Patient admitted 12/27/2020 with syncope and hypoglycemic episode and found to have a left ankle fracture. On 12/29/2020 patient had external fixator to the left ankle by Dr. Okey Dupre. He wanted to wait 7 to 10 days for ORIF procedures secondary to blisters on the skin, which started improving and now going for ORIF on 01/08/2021. Patient had a bed offer at [facility].    Assessment & Plan:   Active Problems:   Closed left ankle fracture   Type 2 diabetes mellitus with hyperlipidemia (HCC)   Essential hypertension   Hyperlipidemia   Hypothyroidism   Acute kidney injury superimposed on CKD (HCC)   Acute metabolic encephalopathy   Cat scratch   Hyperkalemia   Depression   Weakness   Rash   Anemia   Closed trimalleolar fracture of left ankle   Syncope and collapse  1 closed left trimalleolar ankle fracture -Status post external fixator hardware placed to stabilize fracture per Dr. Okey Dupre. -Initially recommended 7 to 10 days for skin to improve prior to definitive procedure. -ORIF scheduled for tomorrow 01/08/2021 as skin has improved. -Continue current pain management. -Per orthopedics.  2.  Bilateral lower extremity cat scratches -Status post 5 days azithromycin.  No further antibiotics needed.  3.  Erythematous rash on the back with itching -Likely secondary to contact dermatitis and excessive sweating. -Improved on current regimen of triamcinolone cream and Atarax.  4.  Well-controlled type 2 diabetes mellitus with hyperlipidemia -Hemoglobin A1c 6.1 (12/27/2020) -CBG 125 this morning. -Discontinue Actos -SSI. -Continue statin.  5.  Acute kidney injury on chronic kidney disease stage II -Resolved.  6.  Iron deficiency anemia -Last hemoglobin noted at 8.8  (01/04/2021). -Repeat labs in the morning.  7.  Hypothyroidism -Synthroid   DVT prophylaxis: Lovenox Code Status: Full Family Communication: Updated patient.  No family at bedside. Disposition:   Status is: Inpatient    Dispo: The patient is from: Home              Anticipated d/c is to: TBD              Patient currently awaiting ORIF left ankle 01/08/2021.  Not stable for discharge.   Difficult to place patient undetermined       Consultants:   Orthopedics: Dr. Okey Dupre 12/28/2020  Wound care RN Evlyn Kanner 12/31/2020  Procedures:   CT head CT maxillofacial 12/27/2020  Plain films of the left ankle 12/27/2020, 12/29/2020, 01/07/2021  Left ankle spanning external fixator per Dr. Okey Dupre 12/29/2020  Antimicrobials:  Anti-infectives (From admission, onward)   Start     Dose/Rate Route Frequency Ordered Stop   01/08/21 0600  ceFAZolin (ANCEF) powder 2 g  Status:  Discontinued        2 g Other To Surgery 01/07/21 1658 01/07/21 1703   01/08/21 0600  ceFAZolin (ANCEF) IVPB 2g/100 mL premix        2 g 200 mL/hr over 30 Minutes Intravenous On call to O.R. 01/07/21 1703 01/09/21 0559   12/31/20 1000  amoxicillin-clavulanate (AUGMENTIN) 875-125 MG per tablet 1 tablet  Status:  Discontinued        1 tablet Oral Every 12 hours 12/30/20 1204 12/30/20 1606   12/31/20 1000  azithromycin (ZITHROMAX) tablet 250 mg       "  Followed by" Linked Group Details   250 mg Oral Daily 12/30/20 1606 01/03/21 0822   12/30/20 1700  azithromycin (ZITHROMAX) tablet 500 mg       "Followed by" Linked Group Details   500 mg Oral Daily 12/30/20 1606 12/30/20 1810   12/29/20 2000  ceFAZolin (ANCEF) 1 g in sodium chloride 0.9 % 100 mL IVPB        1 g 200 mL/hr over 30 Minutes Intravenous Every 8 hours 12/29/20 1551 12/30/20 1402   12/29/20 0000  ceFAZolin (ANCEF) powder 2 g        2 g Other To Surgery 12/28/20 1751 12/29/20 1257        Subjective: On phone ordering lunch, No CP, No SOB, No abd  pain  Objective: Vitals:   01/07/21 0006 01/07/21 0451 01/07/21 0500 01/07/21 0804  BP: 118/60 116/61  109/61  Pulse: 65 65  61  Resp: 16 15  18   Temp: 98.5 F (36.9 C) 98.1 F (36.7 C)  98.4 F (36.9 C)  TempSrc: Oral Oral    SpO2: 98% 96%  94%  Weight:   101 kg   Height:        Intake/Output Summary (Last 24 hours) at 01/07/2021 1157 Last data filed at 01/07/2021 03/09/2021 Gross per 24 hour  Intake --  Output 550 ml  Net -550 ml   Filed Weights   12/27/20 1751 01/05/21 0407 01/07/21 0500  Weight: 104.3 kg 102.5 kg 101 kg    Examination:  General exam: Appears calm and comfortable  Respiratory system: Clear to auscultation. Respiratory effort normal. Cardiovascular system: S1 & S2 heard, RRR. No JVD, murmurs, rubs, gallops or clicks. No pedal edema. Gastrointestinal system: Abdomen is nondistended, soft and nontender. No organomegaly or masses felt. Normal bowel sounds heard. Central nervous system: Alert and oriented. No focal neurological deficits. Extremities: Left lower extremity and external fixator.  Trace to 1+ pedal edema in the left foot. Skin: Improving rash on back.  Improving blisters on left lower extremity with excoriations noted. Psychiatry: Judgement and insight appear normal. Mood & affect appropriate.     Data Reviewed: I have personally reviewed following labs and imaging studies  CBC: Recent Labs  Lab 01/01/21 0719 01/04/21 0433  WBC 6.5 8.2  HGB 9.5* 8.8*  HCT 29.3* 26.9*  MCV 96.7 96.1  PLT 253 284    Basic Metabolic Panel: Recent Labs  Lab 01/01/21 0719 01/04/21 0433  NA 138 136  K 4.1 4.0  CL 107 104  CO2 23 26  GLUCOSE 126* 131*  BUN 24* 26*  CREATININE 1.11 1.14  CALCIUM 8.3* 8.0*    GFR: Estimated Creatinine Clearance: 63.6 mL/min (by C-G formula based on SCr of 1.14 mg/dL).  Liver Function Tests: No results for input(s): AST, ALT, ALKPHOS, BILITOT, PROT, ALBUMIN in the last 168 hours.  CBG: Recent Labs  Lab  01/06/21 0837 01/06/21 1226 01/06/21 1623 01/06/21 2124 01/07/21 0808  GLUCAP 120* 126* 158* 157* 125*     Recent Results (from the past 240 hour(s))  Surgical PCR screen     Status: None   Collection Time: 12/29/20  7:45 AM   Specimen: Nasal Mucosa; Nasal Swab  Result Value Ref Range Status   MRSA, PCR NEGATIVE NEGATIVE Final   Staphylococcus aureus NEGATIVE NEGATIVE Final    Comment: (NOTE) The Xpert SA Assay (FDA approved for NASAL specimens in patients 3 years of age and older), is one component of a comprehensive surveillance program. It is  not intended to diagnose infection nor to guide or monitor treatment. Performed at [facility], [address] [place], [place] [address]          Radiology Studies: DG Ankle 2 Views Left  Result Date: 01/07/2021 CLINICAL DATA:  Preoperative evaluation. EXAM: LEFT ANKLE - 2 VIEW COMPARISON:  12/29/2020. FINDINGS: External fixation noted with hardware noted in the distal tibia and calcaneus. Medial malleolar and distal fibular fractures again noted. IMPRESSION: External fixation left ankle. Electronically Signed   By: Maisie Fus  Register   On: 01/07/2021 08:36        Scheduled Meds: . atorvastatin  80 mg Oral QHS  . enoxaparin (LOVENOX) injection  30 mg Subcutaneous Q24H  . escitalopram  20 mg Oral Daily  . feeding supplement  237 mL Oral BID BM  . ferrous sulfate  325 mg Oral BID WC  . insulin aspart  0-5 Units Subcutaneous QHS  . insulin aspart  0-9 Units Subcutaneous TID WC  . levothyroxine  25 mcg Oral Q0600  . pioglitazone  30 mg Oral Daily  . triamcinolone cream   Topical TID   Continuous Infusions:   LOS: 10 days    Time spent: 35 minutes    Ramiro Harvest, MD [facility]   To contact the attending provider between 7A-7P or the covering provider during after hours 7P-7A, please log into the web site [email or web address] and access using universal [place] password for that web site. If you  do not have the password, please call the hospital operator.  01/07/2021, 11:57 AM

## 2021-01-08 ENCOUNTER — Encounter: Payer: Self-pay | Admitting: Internal Medicine

## 2021-01-08 ENCOUNTER — Inpatient Hospital Stay: Payer: Medicare HMO | Admitting: Anesthesiology

## 2021-01-08 ENCOUNTER — Encounter
Admission: EM | Disposition: A | Discharge status: Skilled Nursing Facility | Payer: Self-pay | Source: Home / Self Care | Attending: Internal Medicine

## 2021-01-08 ENCOUNTER — Inpatient Hospital Stay: Payer: Medicare HMO

## 2021-01-08 DIAGNOSIS — N189 Chronic kidney disease, unspecified: Secondary | ICD-10-CM | POA: Diagnosis not present

## 2021-01-08 DIAGNOSIS — S82892S Other fracture of left lower leg, sequela: Secondary | ICD-10-CM | POA: Diagnosis not present

## 2021-01-08 DIAGNOSIS — N179 Acute kidney failure, unspecified: Secondary | ICD-10-CM | POA: Diagnosis not present

## 2021-01-08 HISTORY — PX: ORIF ANKLE FRACTURE: SHX5408

## 2021-01-08 LAB — BASIC METABOLIC PANEL
Anion gap: 10 (ref 5–15)
BUN: 26 mg/dL — ABNORMAL HIGH (ref 8–23)
CO2: 24 mmol/L (ref 22–32)
Calcium: 8.4 mg/dL — ABNORMAL LOW (ref 8.9–10.3)
Chloride: 102 mmol/L (ref 98–111)
Creatinine, Ser: 1.14 mg/dL (ref 0.61–1.24)
GFR, Estimated: >60 mL/min (ref >60)
Glucose, Bld: 131 mg/dL — ABNORMAL HIGH (ref 70–99)
Potassium: 3.9 mmol/L (ref 3.5–5.1)
Sodium: 136 mmol/L (ref 135–145)

## 2021-01-08 LAB — CBC WITH DIFFERENTIAL/PLATELET
Abs Immature Granulocytes: 0.25 10*3/uL — ABNORMAL HIGH (ref 0.00–0.07)
Basophils Absolute: 0.1 10*3/uL (ref 0.0–0.1)
Basophils Relative: 1 %
Eosinophils Absolute: 0.6 10*3/uL — ABNORMAL HIGH (ref 0.0–0.5)
Eosinophils Relative: 7 %
HCT: 30.3 % — ABNORMAL LOW (ref 39.0–52.0)
Hemoglobin: 9.9 g/dL — ABNORMAL LOW (ref 13.0–17.0)
Immature Granulocytes: 3 %
Lymphocytes Relative: 21 %
Lymphs Abs: 2 10*3/uL (ref 0.7–4.0)
MCH: 31.7 pg (ref 26.0–34.0)
MCHC: 32.7 g/dL (ref 30.0–36.0)
MCV: 97.1 fL (ref 80.0–100.0)
Monocytes Absolute: 0.8 10*3/uL (ref 0.1–1.0)
Monocytes Relative: 8 %
Neutro Abs: 5.8 10*3/uL (ref 1.7–7.7)
Neutrophils Relative %: 60 %
Platelets: 398 10*3/uL (ref 150–400)
RBC: 3.12 MIL/uL — ABNORMAL LOW (ref 4.22–5.81)
RDW: 13.6 % (ref 11.5–15.5)
WBC: 9.5 10*3/uL (ref 4.0–10.5)
nRBC: 0 % (ref 0.0–0.2)

## 2021-01-08 LAB — GLUCOSE, CAPILLARY
Glucose-Capillary: 142 mg/dL — ABNORMAL HIGH (ref 70–99)
Glucose-Capillary: 161 mg/dL — ABNORMAL HIGH (ref 70–99)
Glucose-Capillary: 212 mg/dL — ABNORMAL HIGH (ref 70–99)
Glucose-Capillary: 183 mg/dL — ABNORMAL HIGH (ref 70–99)

## 2021-01-08 SURGERY — OPEN REDUCTION INTERNAL FIXATION (ORIF) ANKLE FRACTURE
Anesthesia: General | Site: Ankle | Laterality: Left

## 2021-01-08 MED ORDER — FENTANYL CITRATE (PF) 100 MCG/2ML IJ SOLN
INTRAMUSCULAR | Status: AC
  Administered 2021-01-08: 25 ug via INTRAVENOUS
  Filled 2021-01-08: qty 2

## 2021-01-08 MED ORDER — ONDANSETRON HCL 4 MG/2ML IJ SOLN
4.0000 mg | Freq: Once | INTRAMUSCULAR | Status: AC | PRN
  Administered 2021-01-08: 4 mg via INTRAVENOUS

## 2021-01-08 MED ORDER — NEOMYCIN-POLYMYXIN B GU 40-200000 IR SOLN
Status: DC | PRN
  Administered 2021-01-08: 4 mL

## 2021-01-08 MED ORDER — LACTATED RINGERS IV SOLN: INTRAVENOUS | Status: DC | PRN

## 2021-01-08 MED ORDER — ONDANSETRON HCL 4 MG/2ML IJ SOLN: 4.0000 mg | Freq: Four times a day (QID) | INTRAMUSCULAR | Status: DC | PRN

## 2021-01-08 MED ORDER — FENTANYL CITRATE (PF) 100 MCG/2ML IJ SOLN
25.0000 ug | INTRAMUSCULAR | Status: DC | PRN
  Administered 2021-01-08 (×2): 25 ug via INTRAVENOUS

## 2021-01-08 MED ORDER — ONDANSETRON HCL 4 MG/2ML IJ SOLN
INTRAMUSCULAR | Status: AC
  Filled 2021-01-08: qty 2

## 2021-01-08 MED ORDER — CEFAZOLIN SODIUM-DEXTROSE 2-4 GM/100ML-% IV SOLN
2.0000 g | Freq: Three times a day (TID) | INTRAVENOUS | Status: AC
  Administered 2021-01-08 – 2021-01-09 (×3): 2 g via INTRAVENOUS
  Filled 2021-01-08 (×3): qty 100

## 2021-01-08 MED ORDER — FENTANYL CITRATE (PF) 100 MCG/2ML IJ SOLN
INTRAMUSCULAR | Status: DC | PRN
  Administered 2021-01-08: 50 ug via INTRAVENOUS
  Administered 2021-01-08 (×2): 25 ug via INTRAVENOUS

## 2021-01-08 MED ORDER — ROCURONIUM BROMIDE 100 MG/10ML IV SOLN
INTRAVENOUS | Status: DC | PRN
  Administered 2021-01-08: 50 mg via INTRAVENOUS
  Administered 2021-01-08: 10 mg via INTRAVENOUS

## 2021-01-08 MED ORDER — SUGAMMADEX SODIUM 200 MG/2ML IV SOLN
INTRAVENOUS | Status: DC | PRN
  Administered 2021-01-08: 200 mg via INTRAVENOUS

## 2021-01-08 MED ORDER — EPHEDRINE SULFATE 50 MG/ML IJ SOLN
INTRAMUSCULAR | Status: DC | PRN
  Administered 2021-01-08: 10 mg via INTRAVENOUS

## 2021-01-08 MED ORDER — DOCUSATE SODIUM 100 MG PO CAPS
100.0000 mg | ORAL_CAPSULE | Freq: Two times a day (BID) | ORAL | Status: DC
  Administered 2021-01-08 – 2021-01-11 (×7): 100 mg via ORAL
  Filled 2021-01-08 (×8): qty 1

## 2021-01-08 MED ORDER — PROPOFOL 10 MG/ML IV BOLUS
INTRAVENOUS | Status: AC
  Filled 2021-01-08: qty 20

## 2021-01-08 MED ORDER — LIDOCAINE HCL (PF) 1 % IJ SOLN
INTRAMUSCULAR | Status: AC
  Filled 2021-01-08: qty 30

## 2021-01-08 MED ORDER — PROPOFOL 10 MG/ML IV BOLUS
INTRAVENOUS | Status: DC | PRN
  Administered 2021-01-08: 100 mg via INTRAVENOUS

## 2021-01-08 MED ORDER — LIDOCAINE HCL (CARDIAC) PF 100 MG/5ML IV SOSY
PREFILLED_SYRINGE | INTRAVENOUS | Status: DC | PRN
  Administered 2021-01-08: 100 mg via INTRAVENOUS

## 2021-01-08 MED ORDER — FENTANYL CITRATE (PF) 100 MCG/2ML IJ SOLN
INTRAMUSCULAR | Status: AC
  Filled 2021-01-08: qty 2

## 2021-01-08 MED ORDER — LIDOCAINE HCL 1 % IJ SOLN
INTRAMUSCULAR | Status: DC | PRN
  Administered 2021-01-08: 40 mL via INTRAMUSCULAR

## 2021-01-08 MED ORDER — ONDANSETRON HCL 4 MG PO TABS
4.0000 mg | ORAL_TABLET | Freq: Four times a day (QID) | ORAL | Status: DC | PRN
Start: 2021-01-08 — End: 2021-01-11

## 2021-01-08 SURGICAL SUPPLY — 62 items
BIT DRILL 2 CANN GRADUATED (BIT) ×2 IMPLANT
BIT DRILL 2.5 CANN STRL (BIT) ×2 IMPLANT
BIT DRILL 2.6 CANN (BIT) ×2 IMPLANT
BLADE SURG 15 STRL LF DISP TIS (BLADE) ×1 IMPLANT
BLADE SURG 15 STRL SS (BLADE) ×1
BNDG COHESIVE 4X5 TAN STRL (GAUZE/BANDAGES/DRESSINGS) ×2 IMPLANT
BNDG ELASTIC 4X5.8 VLCR NS LF (GAUZE/BANDAGES/DRESSINGS) ×4 IMPLANT
BNDG ESMARK 6X12 TAN STRL LF (GAUZE/BANDAGES/DRESSINGS) ×2 IMPLANT
COVER WAND RF STERILE (DRAPES) ×2 IMPLANT
CUFF TOURN SGL QUICK 24 (TOURNIQUET CUFF)
CUFF TOURN SGL QUICK 30 (TOURNIQUET CUFF) ×1
CUFF TRNQT CYL 24X4X16.5-23 (TOURNIQUET CUFF) IMPLANT
CUFF TRNQT CYL 30X4X21-28X (TOURNIQUET CUFF) ×1 IMPLANT
DRAPE FLUOR MINI C-ARM 54X84 (DRAPES) ×2 IMPLANT
DRAPE INCISE IOBAN 66X45 STRL (DRAPES) ×2 IMPLANT
DRAPE U-SHAPE 47X51 STRL (DRAPES) ×2 IMPLANT
DURAPREP 26ML APPLICATOR (WOUND CARE) ×4 IMPLANT
ELECT REM PT RETURN 9FT ADLT (ELECTROSURGICAL) ×2
ELECTRODE REM PT RTRN 9FT ADLT (ELECTROSURGICAL) ×1 IMPLANT
GAUZE SPONGE 4X4 12PLY STRL (GAUZE/BANDAGES/DRESSINGS) ×8 IMPLANT
GAUZE XEROFORM 1X8 LF (GAUZE/BANDAGES/DRESSINGS) ×2 IMPLANT
GAUZE XEROFORM 5X9 LF (GAUZE/BANDAGES/DRESSINGS) ×2 IMPLANT
GLOVE SURG ENC MOIS LTX SZ7.5 (GLOVE) ×4 IMPLANT
GLOVE SURG UNDER POLY LF SZ7.5 (GLOVE) ×4 IMPLANT
GOWN STRL REUS TWL 2XL XL LVL4 (GOWN DISPOSABLE) ×2 IMPLANT
GOWN STRL REUS W/ TWL LRG LVL3 (GOWN DISPOSABLE) ×1 IMPLANT
GOWN STRL REUS W/TWL LRG LVL3 (GOWN DISPOSABLE) ×1
K-WIRE THREADED TIP 1.35 (Wire) ×4 IMPLANT
KIT TURNOVER KIT A (KITS) ×2 IMPLANT
KWIRE THREADED TIP 1.35 (Wire) ×2 IMPLANT
LABEL OR SOLS (LABEL) ×2 IMPLANT
MANIFOLD NEPTUNE II (INSTRUMENTS) ×2 IMPLANT
NS IRRIG 1000ML POUR BTL (IV SOLUTION) ×2 IMPLANT
PACK EXTREMITY ARMC (MISCELLANEOUS) ×2 IMPLANT
PAD ABD DERMACEA PRESS 5X9 (GAUZE/BANDAGES/DRESSINGS) ×12 IMPLANT
PAD CAST CTTN 4X4 STRL (SOFTGOODS) ×5 IMPLANT
PADDING CAST COTTON 4X4 STRL (SOFTGOODS) ×5
PLATE DIST FB LT 5H (Plate) ×2 IMPLANT
PLATE LOCK MED HOOK 3H (Plate) ×2 IMPLANT
SCREW BN T10 FT 20X2.7XST CORT (Screw) ×1 IMPLANT
SCREW CANNULATED 4.0X40MM (Screw) ×2 IMPLANT
SCREW CORT 2.7X20 (Screw) ×1 IMPLANT
SCREW CORT 3.5X40 LP ANKLE (Screw) ×4 IMPLANT
SCREW LOCK T10 FT 18X2.7X (Screw) ×2 IMPLANT
SCREW LOCKING 2.7X14MM (Screw) ×2 IMPLANT
SCREW LOCKING 2.7X16MM (Screw) ×2 IMPLANT
SCREW LOCKING 2.7X18MM (Screw) ×2 IMPLANT
SCREW LOW PROFILE 3.5X14 (Screw) ×4 IMPLANT
SCREW LOW PROFILE 3.5X16 (Screw) ×2 IMPLANT
SPLINT CAST 1 STEP 4X30 (MISCELLANEOUS) IMPLANT
SPLINT FAST PLASTER 5X30 (CAST SUPPLIES) ×1
SPLINT PLASTER CAST FAST 5X30 (CAST SUPPLIES) ×1 IMPLANT
SPONGE LAP 18X18 RF (DISPOSABLE) ×2 IMPLANT
STAPLER SKIN PROX 35W (STAPLE) ×2 IMPLANT
STOCKINETTE STRL 6IN 960660 (GAUZE/BANDAGES/DRESSINGS) ×2 IMPLANT
STRIP CLOSURE SKIN 1/2X4 (GAUZE/BANDAGES/DRESSINGS) ×4 IMPLANT
SUT ETHILON 2 0 FS 18 (SUTURE) ×6 IMPLANT
SUT VIC AB 0 CT1 36 (SUTURE) ×6 IMPLANT
SUT VIC AB 2-0 SH 27 (SUTURE) ×2
SUT VIC AB 2-0 SH 27XBRD (SUTURE) ×2 IMPLANT
SYR 30ML LL (SYRINGE) ×2 IMPLANT
TAPE PAPER 2X10 WHT MICROPORE (GAUZE/BANDAGES/DRESSINGS) ×2 IMPLANT

## 2021-01-08 NOTE — TOC Progression Note (Signed)
Transition of Care [facility]) - Progression Note    Patient Details  Name: Brad Santiago MRN: [id number] Date of Birth: [birth year]  [facility]) CM/SW Contact  Maree Krabbe, LCSW Phone Number: 01/08/2021, 12:46 PM  Clinical Narrative:   Pt managed by regular [employer]-- CSW reached out to [place] and they are restarting auth.         Expected Discharge Plan and Services                                                 Social Determinants of Health (SDOH) Interventions    Readmission Risk Interventions No flowsheet data found.

## 2021-01-08 NOTE — Transfer of Care (Signed)
Immediate Anesthesia Transfer of Care Note  Patient: Brad Santiago  Procedure(s) Performed: OPEN REDUCTION INTERNAL FIXATION (ORIF) TRIMALLEOLAR ANKLE FRACTURE; Removal of External Fixation (Left Ankle)  Patient Location: PACU  Anesthesia Type:General  Level of Consciousness: awake and confused  Airway & Oxygen Therapy: Patient Spontanous Breathing and Patient connected to face mask oxygen  Post-op Assessment: Report given to RN and Post -op Vital signs reviewed and stable  Post vital signs: Reviewed and stable  Last Vitals:  Vitals Value Taken Time  BP 134/55 01/08/21 1039  Temp 36.6 C 01/08/21 1039  Pulse 68 01/08/21 1043  Resp 18 01/08/21 1043  SpO2 99 % 01/08/21 1043  Vitals shown include unvalidated device data.  Last Pain:  Vitals:   01/08/21 0735  TempSrc: Temporal  PainSc: 1       Patients Stated Pain Goal: 0 (01/06/21 0921)  Complications: No complications documented.

## 2021-01-08 NOTE — Plan of Care (Signed)

## 2021-01-08 NOTE — Progress Notes (Signed)
PROGRESS NOTE    Brad Santiago  WGN:[id number] DOB: 09/22/1940 DOA: 12/27/2020 PCP: Dorothey Baseman, MD    Chief Complaint  Patient presents with  . Ankle Pain  . Fall         Brief Narrative:  Patient admitted 12/27/2020 with syncope and hypoglycemic episode and found to have a left ankle fracture. On 12/29/2020 patient had external fixator to the left ankle by Dr. Okey Dupre. He wanted to wait 7 to 10 days for ORIF procedures secondary to blisters on the skin, which started improving and now going for ORIF on 01/08/2021. Patient had a bed offer at [facility].  Assessment & Plan:   Active Problems:   Closed left ankle fracture   Type 2 diabetes mellitus with hyperlipidemia (HCC)   Essential hypertension   Hyperlipidemia   Hypothyroidism   Acute kidney injury superimposed on CKD (HCC)   Acute metabolic encephalopathy   Cat scratch   Hyperkalemia   Depression   Weakness   Rash   Anemia   Closed trimalleolar fracture of left ankle   Syncope and collapse  1 closed left trimalleolar ankle fracture -Status post external fixator hardware placed to stabilize fracture per Dr. Okey Dupre. -waited for skin wound to improve prior to definitive procedure. Plan: --ORIF today -Continue current pain management. --PT  2.  Bilateral lower extremity cat scratches -Status post 5 days azithromycin.  No further antibiotics needed.  3.  Erythematous rash on the back with itching -Likely secondary to contact dermatitis and excessive sweating. -Improved on current regimen of triamcinolone cream and Atarax. --cont triamcinolone cream and atarax  4.  Well-controlled type 2 diabetes mellitus with hyperlipidemia -Hemoglobin A1c 6.1 (12/27/2020) -Discontinue Actos --SSI -Continue statin.  5.  Acute kidney injury on chronic kidney disease stage II -Resolved.  6.  Iron deficiency anemia --anemia workup  7.  Hypothyroidism --cont Synthroid   DVT prophylaxis: Lovenox Code Status:  Full Family Communication:  Disposition:   Status is: Inpatient    Dispo: The patient is from: Home              Anticipated d/c is to: SNF   Consultants:   Orthopedics: Dr. Okey Dupre 12/28/2020  Wound care RN Evlyn Kanner 12/31/2020  Procedures:   CT head CT maxillofacial 12/27/2020  Plain films of the left ankle 12/27/2020, 12/29/2020, 01/07/2021  Left ankle spanning external fixator per Dr. Okey Dupre 12/29/2020  Antimicrobials:  Anti-infectives (From admission, onward)   Start     Dose/Rate Route Frequency Ordered Stop   01/08/21 1400  ceFAZolin (ANCEF) IVPB 2g/100 mL premix        2 g 200 mL/hr over 30 Minutes Intravenous Every 8 hours 01/08/21 1128 01/09/21 1359   01/08/21 0600  ceFAZolin (ANCEF) powder 2 g  Status:  Discontinued        2 g Other To Surgery 01/07/21 1658 01/07/21 1703   01/08/21 0600  ceFAZolin (ANCEF) IVPB 2g/100 mL premix        2 g 200 mL/hr over 30 Minutes Intravenous On call to O.R. 01/07/21 1703 01/08/21 0747   12/31/20 1000  amoxicillin-clavulanate (AUGMENTIN) 875-125 MG per tablet 1 tablet  Status:  Discontinued        1 tablet Oral Every 12 hours 12/30/20 1204 12/30/20 1606   12/31/20 1000  azithromycin (ZITHROMAX) tablet 250 mg       "Followed by" Linked Group Details   250 mg Oral Daily 12/30/20 1606 01/03/21 0822   12/30/20 1700  azithromycin (ZITHROMAX) tablet  500 mg       "Followed by" Linked Group Details   500 mg Oral Daily 12/30/20 1606 12/30/20 1810   12/29/20 2000  ceFAZolin (ANCEF) 1 g in sodium chloride 0.9 % 100 mL IVPB        1 g 200 mL/hr over 30 Minutes Intravenous Every 8 hours 12/29/20 1551 12/30/20 1402   12/29/20 0000  ceFAZolin (ANCEF) powder 2 g        2 g Other To Surgery 12/28/20 1751 12/29/20 1257       Subjective: ORIF today.  After the surgery, pt was somnolent, but intermittently complaining of pain in his foot.  Asked for some soup.   Objective: Vitals:   01/08/21 1115 01/08/21 1135 01/08/21 1422 01/08/21  1642  BP: 131/64 (!) 135/55 (!) 125/54 (!) 129/51  Pulse: 66 63 84 79  Resp: 13 18  18   Temp:  97.9 F (36.6 C)  97.8 F (36.6 C)  TempSrc:      SpO2: 100% 100% 98% 97%  Weight:      Height:        Intake/Output Summary (Last 24 hours) at 01/08/2021 1647 Last data filed at 01/08/2021 1119 Gross per 24 hour  Intake 300 ml  Output 900 ml  Net -600 ml   Filed Weights   12/27/20 1751 01/05/21 0407 01/07/21 0500  Weight: 104.3 kg 102.5 kg 101 kg    Examination:  Constitutional: NAD, somnolent but arousable HEENT: conjunctivae and lids normal, EOMI CV: No cyanosis.   RESP: normal respiratory effort, on RA Extremities: left foot wrapped, left toes swollen SKIN: warm, dry   Data Reviewed: I have personally reviewed following labs and imaging studies  CBC: Recent Labs  Lab 01/04/21 0433 01/08/21 0442  WBC 8.2 9.5  NEUTROABS  --  5.8  HGB 8.8* 9.9*  HCT 26.9* 30.3*  MCV 96.1 97.1  PLT 284 398    Basic Metabolic Panel: Recent Labs  Lab 01/04/21 0433 01/08/21 0442  NA 136 136  K 4.0 3.9  CL 104 102  CO2 26 24  GLUCOSE 131* 131*  BUN 26* 26*  CREATININE 1.14 1.14  CALCIUM 8.0* 8.4*    GFR: Estimated Creatinine Clearance: 63.6 mL/min (by C-G formula based on SCr of 1.14 mg/dL).  Liver Function Tests: No results for input(s): AST, ALT, ALKPHOS, BILITOT, PROT, ALBUMIN in the last 168 hours.  CBG: Recent Labs  Lab 01/07/21 1618 01/07/21 2102 01/08/21 1042 01/08/21 1141 01/08/21 1645  GLUCAP 195* 171* 142* 161* 212*     No results found for this or any previous visit (from the past 240 hour(s)).       Radiology Studies: DG Ankle 2 Views Left  Result Date: 01/08/2021 CLINICAL DATA:  Surgery. EXAM: DG C-ARM 1-60 MIN; LEFT ANKLE - 2 VIEW FLUOROSCOPY TIME:  Fluoroscopy Time:  55 seconds. Number of Acquired Spot Images: 3 COMPARISON:  Jan 07, 2021. FINDINGS: Three C-arm fluoroscopic images were obtained intraoperatively and submitted for post operative  interpretation. These images demonstrate plate and screw fixation of the distal fibula and plate and screw fixation of the medial malleolus with additional single screw through the medial malleolar fracture. Improved alignment, near anatomic. Please see the performing provider's procedural report for further detail. IMPRESSION: Intraoperative fluoroscopy, as detailed above. Electronically Signed   By: Jan 09, 2021 MD   On: 01/08/2021 11:36   DG Ankle 2 Views Left  Result Date: 01/07/2021 CLINICAL DATA:  Preoperative evaluation. EXAM: LEFT ANKLE -  2 VIEW COMPARISON:  12/29/2020. FINDINGS: External fixation noted with hardware noted in the distal tibia and calcaneus. Medial malleolar and distal fibular fractures again noted. IMPRESSION: External fixation left ankle. Electronically Signed   By: Maisie Fus  Register   On: 01/07/2021 08:36   DG C-Arm 1-60 Min  Result Date: 01/08/2021 CLINICAL DATA:  Surgery. EXAM: DG C-ARM 1-60 MIN; LEFT ANKLE - 2 VIEW FLUOROSCOPY TIME:  Fluoroscopy Time:  55 seconds. Number of Acquired Spot Images: 3 COMPARISON:  Jan 07, 2021. FINDINGS: Three C-arm fluoroscopic images were obtained intraoperatively and submitted for post operative interpretation. These images demonstrate plate and screw fixation of the distal fibula and plate and screw fixation of the medial malleolus with additional single screw through the medial malleolar fracture. Improved alignment, near anatomic. Please see the performing provider's procedural report for further detail. IMPRESSION: Intraoperative fluoroscopy, as detailed above. Electronically Signed   By: Feliberto Harts MD   On: 01/08/2021 11:36   DG C-Arm 1-60 Min  Result Date: 01/08/2021 CLINICAL DATA:  Surgery. EXAM: DG C-ARM 1-60 MIN; LEFT ANKLE - 2 VIEW FLUOROSCOPY TIME:  Fluoroscopy Time:  55 seconds. Number of Acquired Spot Images: 3 COMPARISON:  Jan 07, 2021. FINDINGS: Three C-arm fluoroscopic images were obtained intraoperatively and submitted  for post operative interpretation. These images demonstrate plate and screw fixation of the distal fibula and plate and screw fixation of the medial malleolus with additional single screw through the medial malleolar fracture. Improved alignment, near anatomic. Please see the performing provider's procedural report for further detail. IMPRESSION: Intraoperative fluoroscopy, as detailed above. Electronically Signed   By: Feliberto Harts MD   On: 01/08/2021 11:36        Scheduled Meds: . atorvastatin  80 mg Oral QHS  . docusate sodium  100 mg Oral BID  . enoxaparin (LOVENOX) injection  30 mg Subcutaneous Q24H  . escitalopram  20 mg Oral Daily  . feeding supplement  237 mL Oral BID BM  . ferrous sulfate  325 mg Oral BID WC  . insulin aspart  0-5 Units Subcutaneous QHS  . insulin aspart  0-9 Units Subcutaneous TID WC  . levothyroxine  25 mcg Oral Q0600  . ondansetron      . triamcinolone cream   Topical TID   Continuous Infusions: .  ceFAZolin (ANCEF) IV 2 g (01/08/21 1409)     LOS: 11 days    Darlin Priestly, MD [facility]   To contact the attending provider between 7A-7P or the covering provider during after hours 7P-7A, please log into the web site [email or web address] and access using universal [place] password for that web site. If you do not have the password, please call the hospital operator.  01/08/2021, 4:47 PM

## 2021-01-08 NOTE — Care Management Important Message (Signed)
Important Message  Patient Details  Name: Brad Santiago MRN: [id number] Date of Birth: [birth year]   Medicare Important Message Given:  Yes     Olegario Messier A Lisandra Mathisen 01/08/2021, 2:35 PM

## 2021-01-08 NOTE — Evaluation (Signed)
Occupational Therapy Re-Evaluation Patient Details Name: Brad Santiago MRN: [id number] DOB: [birth year] Today's Date: 01/08/2021    History of Present Illness Brad Santiago is a 80 y.o. male with medical history significant for hypertension, non-insulin-dependent diabetes mellitus, hypothyroid, hyperlipidemia, peripheral neuropathy, presents to the emergency department for chief concerns of passing out. Patient states he was sitting down on his chair when he felt weak in the last thing he remembered was seeing the coffee table about to catch his chin as he fell forward. He woke up on the floor.  He states he does not remember anything else.  Patient sustained a bimalleolar L ankle fracture. S/P external fixation on 4/26. Now with new therapy orders s/p ex-fix removal and ORIF 5/6.   Clinical Impression   Pt seen for OT Re-evaluation this date in setting of prolonged acute hospitalization initially with external fixator to L ankle, and now POD #0 s/p ORIF. Pt Presents this date with some drowsiness impacting assessment as well as post-op pain and decreased activity tolerance superimposed on some baseline weakness. On assessment this date, pt requires: SETUP to MIN A for seated UB ADLs, MAX A For seated LB ADLs (TOTAL on post op L LE, MIN A for R LE). transfer not attempted at this time 2/2 pain and drowsiness. MIN/MOD for bed mobility and MOD A +2 for repositioning. Pt left with all needs met and in reach with RN presenting to assess and PT presenting to assess pt and assist with further repositioning needs. Will continue to follow acutely. Continue to anticipate that pt will require f/u therapy services in STR setting upon d/c from hospital to achieve his highest potential with ADL/ADL mobility safety/INDEP.     Follow Up Recommendations  SNF    Equipment Recommendations  3 in 1 bedside commode;Tub/shower seat;Other (comment)    Recommendations for Other Services       Precautions / Restrictions  Precautions Precautions: Fall Restrictions Weight Bearing Restrictions: Yes LLE Weight Bearing: Non weight bearing      Mobility Bed Mobility Overal bed mobility: Modified Independent Bed Mobility: Supine to Sit;Sit to Supine     Supine to sit: Min guard;Min assist;HOB elevated Sit to supine: Mod assist;HOB elevated   General bed mobility comments: increased assist for sit to sup for LEs.    Transfers                 General transfer comment: deferred d/t pain and drowsiness today    Balance Overall balance assessment: Needs assistance Sitting-balance support: Feet supported;Single extremity supported Sitting balance-Leahy Scale: Fair Sitting balance - Comments: some UE support utilized as pt is somewhat drowsy/fatigued and less sturdy in static sitting       Standing balance comment: deferred                           ADL either performed or assessed with clinical judgement   ADL Overall ADL's : Needs assistance/impaired                                       General ADL Comments: SETUP to MIN A for seated UB ADLs, MAX A For seated LB ADLs (TOTAL on post op L LE, MIN A for R LE). transfer not attempted at this time. MIN/MOD for bed mobility.     Vision Patient Visual Report: No change from  baseline       Perception     Praxis      Pertinent Vitals/Pain Pain Assessment: 0-10 Pain Score: 6  Pain Location: L LE Pain Descriptors / Indicators: Grimacing;Operative site guarding;Tender Pain Intervention(s): Limited activity within patient's tolerance;Monitored during session;Repositioned     Hand Dominance     Extremity/Trunk Assessment Upper Extremity Assessment Upper Extremity Assessment: Overall WFL for tasks assessed;Generalized weakness (ROM WFL, MMT grossly 4-/5, grip somewhat decreased versus initial evaluation, but appears to be more d/t after effects of surgical sedation medication versus actual strength loss)    Lower Extremity Assessment Lower Extremity Assessment: Defer to PT evaluation;RLE deficits/detail;LLE deficits/detail RLE Deficits / Details: primarily WFL, somewhat limited hip rotation (WNL given age) RLE Sensation: history of peripheral neuropathy LLE Deficits / Details: limited sensation to L toes, ROM limited d/t bandaging. Pesents with fair amount of edema in L LE. LLE: Unable to fully assess due to pain LLE Sensation: history of peripheral neuropathy;decreased light touch LLE Coordination: decreased fine motor;decreased gross motor   Cervical / Trunk Assessment Cervical / Trunk Assessment: Normal   Communication Communication Communication: No difficulties   Cognition Arousal/Alertness: Awake/alert Behavior During Therapy: WFL for tasks assessed/performed Overall Cognitive Status: Within Functional Limits for tasks assessed                                 General Comments: Pt is oriented and easily rousable, but somewhat drowsy being POD #0 ORIF. Pt able to follow all commands appropriately   General Comments       Exercises Other Exercises Other Exercises: OT re-educates pt on role and rationale for OT services in acute setting following an orthopedic surgery as well as importance of OOB activity for prevention of skin breakdown and muscle atrophy. Pt with moderate reception, but somewhat limited 2/2 post-op drowsiness.   Shoulder Instructions      Home Living Family/patient expects to be discharged to:: Private residence Living Arrangements: Alone Available Help at Discharge: Family;Available PRN/intermittently (son lives within 10 miles) Type of Home: House Home Access: Ramped entrance     Home Layout: One level               Home Equipment: Environmental consultant - 2 wheels;Wheelchair - manual;Cane - single point   Additional Comments: patient has one step to get into bathroom.      Prior Functioning/Environment Level of Independence: Independent with  assistive device(s)                 OT Problem List: Decreased strength;Decreased range of motion;Decreased activity tolerance;Impaired balance (sitting and/or standing);Decreased knowledge of use of DME or AE;Impaired sensation;Increased edema      OT Treatment/Interventions: Self-care/ADL training;DME and/or AE instruction;Therapeutic activities;Balance training;Therapeutic exercise;Patient/family education    OT Goals(Current goals can be found in the care plan section) Acute Rehab OT Goals Patient Stated Goal: have surgery and get stronger OT Goal Formulation: With patient Time For Goal Achievement: 01/22/21 Potential to Achieve Goals: Good  OT Frequency: Min 1X/week   Barriers to D/C:            Co-evaluation              AM-PAC OT "6 Clicks" Daily Activity     Outcome Measure Help from another person eating meals?: None Help from another person taking care of personal grooming?: A Little Help from another person toileting, which includes using toliet, bedpan, or  urinal?: A Lot Help from another person bathing (including washing, rinsing, drying)?: A Lot Help from another person to put on and taking off regular upper body clothing?: A Little Help from another person to put on and taking off regular lower body clothing?: A Lot 6 Click Score: 16   End of Session Nurse Communication: Mobility status  Activity Tolerance: Patient tolerated treatment well Patient left: in bed;with call bell/phone within reach;with nursing/sitter in room (with RN assessing pt and PT presenting to assess and assist with repositioning.)  OT Visit Diagnosis: Unsteadiness on feet (R26.81);Muscle weakness (generalized) (M62.81);History of falling (Z91.81)                Time: 2458-0998 OT Time Calculation (min): 16 min Charges:  OT General Charges $OT Visit: 1 Visit OT Evaluation $OT Eval Moderate Complexity: 1 Mod OT Treatments $Self Care/Home Management : 8-22 mins  Gerrianne Scale,  MS, OTR/L ascom [phone] 01/08/21, 3:34 PM

## 2021-01-08 NOTE — Progress Notes (Signed)
Upon arrival to floor, patient awake, alert to name, place, year , aware of president, were he lives.  Total IVF's in pacu for total liter for procedure.

## 2021-01-08 NOTE — Op Note (Signed)
01/08/2021  10:33 AM  PATIENT:  Brad Santiago    PRE-OPERATIVE DIAGNOSIS:  Y92.446K Displaced bimalleolar fracture of left lower leg, init  POST-OPERATIVE DIAGNOSIS:  Same  PROCEDURE:  OPEN REDUCTION INTERNAL FIXATION (ORIF) TRIMALLEOLAR ANKLE FRACTURE; Removal of External Fixation  SURGEON:  Renee Harder, MD  ASSISTANT: Roland Rack, PA  ANESTHESIA:   General  PREOPERATIVE INDICATIONS:  Brad Santiago is a  80 y.o. male with a diagnosis of S82.842A Displaced bimalleolar fracture of left lower leg, init who failed conservative measures and elected for surgical management.    I discussed the risks and benefits of surgery. The risks include but are not limited to infection, bleeding requiring blood transfusion, nerve or blood vessel injury, joint stiffness or loss of motion, persistent pain, weakness or instability, malunion, nonunion and hardware failure and the need for further surgery. Medical risks include but are not limited to DVT and pulmonary embolism, myocardial infarction, stroke, pneumonia, respiratory failure and death. Patient understood these risks and wished to proceed.   OPERATIVE IMPLANTS: Arthrex distal fibula plate, medial malleolus hook plate  OPERATIVE FINDINGS: comminuted distal fibula fracture and comminuted medial malleolus fracture  OPERATIVE PROCEDURE:   Patient was met in the preoperative area. The operative leg was signed my initials and the word yes according the hospital's correct site of surgery protocol.  The patient was brought to the operating room where the patient underwent general anesthesia. The patient was placed supine on the operative table. A bump was placed under the operative hip. A tourniquet was applied to the operative thigh.  The lower extremity was prepped and draped in a sterile fashion. A timeout was performed to verify the patient's name, date of birth, medical record number, correct site of surgery and correct procedure to be performed. It was  also used to verify the patient received antibiotics, and that all appropriate instruments, implants and radiographic studies were available in the room. Once all in attendance were in agreement, the case began.  The external fixator was removed after sterile prep in the standard fashion.  The left lower extremities then reprepped and draped prior to starting ORIF.  The left lower extremity was exsanguinated with an Esmarch. The tourniquet was inflated to 275 mmHg. This was applied for a total of 90 minutes. A lateral incision was made over the fibula. The subcutaneous tissues were dissected with the Metzenbaum scissor and pickup. Care was taken to avoid injury to the superficial peroneal nerve. The lateral malleolus fracture was identified and irrigated and fracture hematoma was removed. Soft tissue was removed from the fracture site using a periosteal elevator.  Fracture reduction was difficult due to comminution at the fracture site and poor quality of the bone.  It was unable to hold reduction clamps.  Decision was made to place a distal fibular locking plate and fixed it distally followed by reduction and proximal fixation.  Locking screws were placed distally to allow for nice fixation of the plate.  Proximal cortical screws were placed.  Fluoroscopy was utilized throughout this portion of the procedure to ensure appropriate fracture reduction, plate placement, screw length and trajectory.  Once the lateral malleolus was plated, the attention was turned to the medial ankle. A small vertical incision was made over the tip of the medial malleolus.  Soft tissue was dissected with some with the Metzenbaum scissor and pickup. The fracture of the medial malleolus was identified.  It was also noted to be comminuted.  Decision was made to place  a hook plate.  This was tamped into the bone and a proximal cortical screw was utilized to bring the plate to bone.  Next a homerun screw was placed which was partially  threaded to allow for compression of the fracture site.  An additional proximal cortical screw was placed.  Fluoroscopy ensured appropriate plate placement and fracture reduction, screw length and trajectory.   There was a small posterior malleolus fragment, which reduced nicely after fixation of the medial and lateral malleolus. A stress test of the right ankle was then performed under fluoroscopy.  This test did not reveal any syndesmotic injury or opening of the medial clear space.  The medial and lateral incisions were then copiously irrigated. The subcutaneous tissue was closed with 0 Vicryl, 2-0 Vicryl and the skin was closed with 2-0 nylon. A dry sterile dressing was applied along with an AO splint. The patient's ankle was positioned in neutral. The pateint was then awoken from anesthesia, transferred to hospital bed and brought to the PACU in stable condition. I was scrubbed and present the entire case and all sharp and instrument counts were correct at conclusion the case.  The patient's family was not available after the procedure and there were no phone numbers in the chart.  We will try to reach the nurse to get in touch with the family postoperatively.  Postop plan: Patient will be nonweightbearing on the left lower extremity.  He will be instructed for strict elevation and "toes above the nose" to reduce swelling.  He will be allowed to resume DVT prophylaxis starting postop day 1.  He will remain in the splint for the next 2 to 3 weeks until follow-up in clinic, where splint will be removed, sutures will be removed and we will obtain x-rays.  He will be discharged per the primary hospitalist service.   Renee Harder

## 2021-01-08 NOTE — Progress Notes (Signed)
Pt to OR via bed stable without complaint at this time.

## 2021-01-08 NOTE — Anesthesia Preprocedure Evaluation (Signed)
Anesthesia Evaluation  Patient identified by MRN, date of birth, ID band Patient awake    Reviewed: Allergy & Precautions, NPO status , Patient's Chart, lab work & pertinent test results  History of Anesthesia Complications Negative for: history of anesthetic complications  Airway Mallampati: II  TM Distance: >3 FB Neck ROM: Full    Dental  (+) Edentulous Upper, Edentulous Lower   Pulmonary neg pulmonary ROS, neg sleep apnea, neg COPD,    breath sounds clear to auscultation- rhonchi (-) wheezing      Cardiovascular hypertension, Pt. on medications (-) CAD, (-) Past MI, (-) Cardiac Stents and (-) CABG  Rhythm:Regular Rate:Normal - Systolic murmurs and - Diastolic murmurs    Neuro/Psych neg Seizures PSYCHIATRIC DISORDERS Depression negative neurological ROS     GI/Hepatic negative GI ROS, Neg liver ROS,   Endo/Other  diabetes, Oral Hypoglycemic AgentsHypothyroidism   Renal/GU Renal InsufficiencyRenal disease     Musculoskeletal negative musculoskeletal ROS (+)   Abdominal (+) + obese,   Peds  Hematology negative hematology ROS (+) anemia ,   Anesthesia Other Findings Past Medical History: No date: Diabetes (HCC) No date: Hyperlipemia No date: Hypertension   Reproductive/Obstetrics                             Anesthesia Physical  Anesthesia Plan  ASA: III  Anesthesia Plan: General   Post-op Pain Management:  Regional for Post-op pain   Induction: Intravenous  PONV Risk Score and Plan: 1 and Ondansetron  Airway Management Planned: Oral ETT  Additional Equipment:   Intra-op Plan:   Post-operative Plan: Extubation in OR  Informed Consent: I have reviewed the patients History and Physical, chart, labs and discussed the procedure including the risks, benefits and alternatives for the proposed anesthesia with the patient or authorized representative who has indicated his/her  understanding and acceptance.     Dental advisory given  Plan Discussed with: CRNA and Anesthesiologist  Anesthesia Plan Comments:         Anesthesia Quick Evaluation

## 2021-01-08 NOTE — Progress Notes (Signed)
PT Cancellation Note  Patient Details Name: Brad Santiago MRN: [id number] DOB: 01/10/1941   Cancelled Treatment:     PT attempt. Pt off floor for surgery. Will re-evaluate post surgery and continue to follow per POC.    Rushie Chestnut 01/08/2021, 8:08 AM

## 2021-01-08 NOTE — Anesthesia Procedure Notes (Signed)
Procedure Name: Intubation Date/Time: 01/08/2021 7:52 AM Performed by: Danelle Berry, CRNA Pre-anesthesia Checklist: Patient identified, Emergency Drugs available, Suction available and Patient being monitored Patient Re-evaluated:Patient Re-evaluated prior to induction Oxygen Delivery Method: Circle system utilized Preoxygenation: Pre-oxygenation with 100% oxygen Induction Type: IV induction Ventilation: Mask ventilation without difficulty Laryngoscope Size: McGraph and 3 Grade View: Grade I Tube type: Oral Tube size: 7.5 mm Number of attempts: 1 Airway Equipment and Method: Stylet and Oral airway Placement Confirmation: ETT inserted through vocal cords under direct vision,  positive ETCO2 and breath sounds checked- equal and bilateral Tube secured with: Tape Dental Injury: Teeth and Oropharynx as per pre-operative assessment

## 2021-01-08 NOTE — Evaluation (Signed)
Physical Therapy Evaluation Patient Details Name: Brad Santiago MRN: [id number] DOB: October 05, 1940 Today's Date: 01/08/2021   History of Present Illness  Brad Santiago is a 80 y.o. male with medical history significant for hypertension, non-insulin-dependent diabetes mellitus, hypothyroid, hyperlipidemia, peripheral neuropathy, presents to the emergency department for chief concerns of passing out. Patient states he was sitting down on his chair when he felt weak in the last thing he remembered was seeing the coffee table about to catch his chin as he fell forward. He woke up on the floor.  He states he does not remember anything else.  Patient sustained a bimalleolar L ankle fracture. S/P external fixation on 4/26. Now with new therapy orders s/p ex-fix removal and ORIF 5/6.  Clinical Impression  Patient received in bed, had just gotten up to side of bed with OT. Patient is having more pain than prior to today's surgery, requiring increased assistance with bed mobility, but continues to move well. Assisted in scooted up to head of bed, able to raise L LE against gravity. Patient will continue to benefit from skilled PT while here to improve functional mobility and independence.     Follow Up Recommendations SNF;Supervision for mobility/OOB    Equipment Recommendations  Other (comment);None recommended by PT (to be determined)    Recommendations for Other Services       Precautions / Restrictions Precautions Precautions: Fall Restrictions Weight Bearing Restrictions: Yes LLE Weight Bearing: Non weight bearing      Mobility  Bed Mobility Overal bed mobility: Needs Assistance Bed Mobility: Supine to Sit;Sit to Supine     Supine to sit: HOB elevated;Min guard;Min assist Sit to supine: Mod assist;HOB elevated   General bed mobility comments: increased assist for sit to sup for LEs due to increased pain since surgery.    Transfers                 General transfer comment: deferred  d/t pain and drowsiness today  Ambulation/Gait                Stairs            Wheelchair Mobility    Modified Rankin (Stroke Patients Only)       Balance Overall balance assessment: Needs assistance Sitting-balance support: Feet supported Sitting balance-Leahy Scale: Fair Sitting balance - Comments: some UE support utilized as pt is somewhat drowsy/fatigued and less sturdy in static sitting       Standing balance comment: deferred                             Pertinent Vitals/Pain Pain Assessment: Faces Pain Score: 6  Faces Pain Scale: Hurts little more Pain Location: L LE Pain Descriptors / Indicators: Grimacing;Operative site guarding;Sore Pain Intervention(s): Monitored during session;Repositioned;Premedicated before session    Home Living Family/patient expects to be discharged to:: Skilled nursing facility Living Arrangements: Alone Available Help at Discharge: Family;Available PRN/intermittently Type of Home: House Home Access: Ramped entrance     Home Layout: One level Home Equipment: Walker - 2 wheels;Wheelchair - manual;Cane - single point Additional Comments: patient has one step to get into bathroom.    Prior Function Level of Independence: Independent with assistive device(s)               Hand Dominance        Extremity/Trunk Assessment   Upper Extremity Assessment Upper Extremity Assessment: Defer to OT evaluation  Lower Extremity Assessment Lower Extremity Assessment: LLE deficits/detail RLE Deficits / Details: primarily WFL, somewhat limited hip rotation (WNL given age) RLE Sensation: history of peripheral neuropathy LLE Deficits / Details: limited sensation to L toes, ROM limited d/t bandaging. Pesents with fair amount of edema in L LE. LLE: Unable to fully assess due to pain LLE Sensation: history of peripheral neuropathy;decreased light touch LLE Coordination: decreased gross motor    Cervical /  Trunk Assessment Cervical / Trunk Assessment: Normal  Communication   Communication: No difficulties  Cognition Arousal/Alertness: Awake/alert Behavior During Therapy: WFL for tasks assessed/performed Overall Cognitive Status: Within Functional Limits for tasks assessed                                 General Comments: Pt is oriented and easily rousable, but somewhat drowsy being POD #0 ORIF. Pt able to follow all commands appropriately      General Comments      Exercises Other Exercises Other Exercises: OT re-educates pt on role and rationale for OT services in acute setting following an orthopedic surgery as well as importance of OOB activity for prevention of skin breakdown and muscle atrophy. Pt with moderate reception, but somewhat limited 2/2 post-op drowsiness.   Assessment/Plan    PT Assessment Patient needs continued PT services  PT Problem List Decreased strength;Decreased activity tolerance;Decreased balance;Decreased mobility;Decreased knowledge of use of DME;Decreased knowledge of precautions;Decreased safety awareness;Pain       PT Treatment Interventions DME instruction;Therapeutic exercise;Gait training;Balance training;Functional mobility training;Therapeutic activities;Patient/family education    PT Goals (Current goals can be found in the Care Plan section)  Acute Rehab PT Goals Patient Stated Goal: to return home PT Goal Formulation: With patient Time For Goal Achievement: 01/22/21 Potential to Achieve Goals: Fair    Frequency 7X/week   Barriers to discharge Decreased caregiver support;Inaccessible home environment      Co-evaluation               AM-PAC PT "6 Clicks" Mobility  Outcome Measure Help needed turning from your back to your side while in a flat bed without using bedrails?: A Little Help needed moving from lying on your back to sitting on the side of a flat bed without using bedrails?: A Little Help needed moving to  and from a bed to a chair (including a wheelchair)?: A Lot Help needed standing up from a chair using your arms (e.g., wheelchair or bedside chair)?: A Lot Help needed to walk in hospital room?: Total Help needed climbing 3-5 steps with a railing? : Total 6 Click Score: 12    End of Session   Activity Tolerance: No increased pain Patient left: in bed;with call bell/phone within reach;with bed alarm set Nurse Communication: Mobility status PT Visit Diagnosis: Other abnormalities of gait and mobility (R26.89);Muscle weakness (generalized) (M62.81);History of falling (Z91.81);Pain Pain - Right/Left: Left Pain - part of body: Ankle and joints of foot    Time: 1450-1500 PT Time Calculation (min) (ACUTE ONLY): 10 min   Charges:   PT Evaluation $PT Re-evaluation: 1 Re-eval          Ymani Porcher, PT, GCS 01/08/21,4:10 PM

## 2021-01-09 DIAGNOSIS — S82892S Other fracture of left lower leg, sequela: Secondary | ICD-10-CM | POA: Diagnosis not present

## 2021-01-09 DIAGNOSIS — D509 Iron deficiency anemia, unspecified: Secondary | ICD-10-CM | POA: Diagnosis not present

## 2021-01-09 LAB — CBC
HCT: 25 % — ABNORMAL LOW (ref 39.0–52.0)
Hemoglobin: 8.2 g/dL — ABNORMAL LOW (ref 13.0–17.0)
MCH: 32.2 pg (ref 26.0–34.0)
MCHC: 32.8 g/dL (ref 30.0–36.0)
MCV: 98 fL (ref 80.0–100.0)
Platelets: 337 10*3/uL (ref 150–400)
RBC: 2.55 MIL/uL — ABNORMAL LOW (ref 4.22–5.81)
RDW: 13.7 % (ref 11.5–15.5)
WBC: 11.6 10*3/uL — ABNORMAL HIGH (ref 4.0–10.5)
nRBC: 0 % (ref 0.0–0.2)

## 2021-01-09 LAB — BASIC METABOLIC PANEL
Anion gap: 9 (ref 5–15)
BUN: 24 mg/dL — ABNORMAL HIGH (ref 8–23)
CO2: 23 mmol/L (ref 22–32)
Calcium: 7.9 mg/dL — ABNORMAL LOW (ref 8.9–10.3)
Chloride: 99 mmol/L (ref 98–111)
Creatinine, Ser: 1.23 mg/dL (ref 0.61–1.24)
GFR, Estimated: 59 mL/min — ABNORMAL LOW (ref >60)
Glucose, Bld: 164 mg/dL — ABNORMAL HIGH (ref 70–99)
Potassium: 4 mmol/L (ref 3.5–5.1)
Sodium: 131 mmol/L — ABNORMAL LOW (ref 135–145)

## 2021-01-09 LAB — IRON AND TIBC
Iron: 25 ug/dL — ABNORMAL LOW (ref 45–182)
Saturation Ratios: 9 % — ABNORMAL LOW (ref 17.9–39.5)
TIBC: 280 ug/dL (ref 250–450)
UIBC: 255 ug/dL

## 2021-01-09 LAB — GLUCOSE, CAPILLARY
Glucose-Capillary: 151 mg/dL — ABNORMAL HIGH (ref 70–99)
Glucose-Capillary: 162 mg/dL — ABNORMAL HIGH (ref 70–99)
Glucose-Capillary: 166 mg/dL — ABNORMAL HIGH (ref 70–99)
Glucose-Capillary: 219 mg/dL — ABNORMAL HIGH (ref 70–99)
Glucose-Capillary: 187 mg/dL — ABNORMAL HIGH (ref 70–99)

## 2021-01-09 LAB — FOLATE: Folate: 5.7 ng/mL — ABNORMAL LOW (ref >5.9)

## 2021-01-09 LAB — MAGNESIUM: Magnesium: 1.7 mg/dL (ref 1.7–2.4)

## 2021-01-09 LAB — VITAMIN B12: Vitamin B-12: 993 pg/mL — ABNORMAL HIGH (ref 180–914)

## 2021-01-09 MED ORDER — ENOXAPARIN SODIUM 40 MG/0.4ML IJ SOSY
40.0000 mg | PREFILLED_SYRINGE | INTRAMUSCULAR | Status: DC
  Administered 2021-01-09 – 2021-01-10 (×2): 40 mg via SUBCUTANEOUS
  Filled 2021-01-09 (×2): qty 0.4

## 2021-01-09 NOTE — Plan of Care (Signed)

## 2021-01-09 NOTE — Progress Notes (Signed)
PROGRESS NOTE    Brad Santiago  SWN:[id number] DOB: 11/13/1940 DOA: 12/27/2020 PCP: Dorothey Baseman, MD    Chief Complaint  Patient presents with  . Ankle Pain  . Fall         Brief Narrative:  Patient admitted 12/27/2020 with syncope and hypoglycemic episode and found to have a left ankle fracture. On 12/29/2020 patient had external fixator to the left ankle by Dr. Okey Dupre. He wanted to wait 7 to 10 days for ORIF procedures secondary to blisters on the skin, which started improving and now going for ORIF on 01/08/2021. Patient had a bed offer at [facility].  Assessment & Plan:   Active Problems:   Closed left ankle fracture   Type 2 diabetes mellitus with hyperlipidemia (HCC)   Essential hypertension   Hyperlipidemia   Hypothyroidism   Acute kidney injury superimposed on CKD (HCC)   Acute metabolic encephalopathy   Cat scratch   Hyperkalemia   Depression   Weakness   Rash   Anemia   Closed trimalleolar fracture of left ankle   Syncope and collapse  1 closed left trimalleolar ankle fracture S/p External fixation on 12/29/20 and ORFI on 01/08/21 -with Dr. Okey Dupre. -waited for skin wound to improve prior to definitive procedure. Plan: -Continue current pain management. --PT --rec discharge to SNF  2.  Bilateral lower extremity cat scratches -Status post 5 days azithromycin.  No further antibiotics needed.  3.  Erythematous rash on the back with itching -Likely secondary to contact dermatitis and excessive sweating. -Improved on current regimen of triamcinolone cream and Atarax. --cont triamcinolone cream and atarax  4.  Well-controlled type 2 diabetes mellitus with hyperlipidemia -Hemoglobin A1c 6.1 (12/27/2020) -Discontinue Actos --SSI -Continue statin.  5.  Acute kidney injury on chronic kidney disease stage II -Resolved.  6.  Iron deficiency anemia --anemia workup  7.  Hypothyroidism --cont Synthroid   DVT prophylaxis: Lovenox SQ Code Status:  Full code  Family Communication:  Status is: inpatient Dispo:   The patient is from: home Anticipated d/c is to: SNF Anticipated d/c date is: whenever bed available Patient currently is medically stable to d/c.   Consultants:   Orthopedics: Dr. Okey Dupre 12/28/2020  Wound care RN Evlyn Kanner 12/31/2020  Procedures:   CT head CT maxillofacial 12/27/2020  Plain films of the left ankle 12/27/2020, 12/29/2020, 01/07/2021  Left ankle spanning external fixator per Dr. Okey Dupre 12/29/2020  Antimicrobials:  Anti-infectives (From admission, onward)   Start     Dose/Rate Route Frequency Ordered Stop   01/08/21 1400  ceFAZolin (ANCEF) IVPB 2g/100 mL premix        2 g 200 mL/hr over 30 Minutes Intravenous Every 8 hours 01/08/21 1128 01/09/21 0603   01/08/21 0600  ceFAZolin (ANCEF) powder 2 g  Status:  Discontinued        2 g Other To Surgery 01/07/21 1658 01/07/21 1703   01/08/21 0600  ceFAZolin (ANCEF) IVPB 2g/100 mL premix        2 g 200 mL/hr over 30 Minutes Intravenous On call to O.R. 01/07/21 1703 01/08/21 0747   12/31/20 1000  amoxicillin-clavulanate (AUGMENTIN) 875-125 MG per tablet 1 tablet  Status:  Discontinued        1 tablet Oral Every 12 hours 12/30/20 1204 12/30/20 1606   12/31/20 1000  azithromycin (ZITHROMAX) tablet 250 mg       "Followed by" Linked Group Details   250 mg Oral Daily 12/30/20 1606 01/03/21 0822   12/30/20 1700  azithromycin (  ZITHROMAX) tablet 500 mg       "Followed by" Linked Group Details   500 mg Oral Daily 12/30/20 1606 12/30/20 1810   12/29/20 2000  ceFAZolin (ANCEF) 1 g in sodium chloride 0.9 % 100 mL IVPB        1 g 200 mL/hr over 30 Minutes Intravenous Every 8 hours 12/29/20 1551 12/30/20 1402   12/29/20 0000  ceFAZolin (ANCEF) powder 2 g        2 g Other To Surgery 12/28/20 1751 12/29/20 1257       Subjective: Reported pain present.  Had BM.   Objective: Vitals:   01/08/21 2130 01/09/21 0442 01/09/21 0836 01/09/21 1505  BP: 120/74 (!)  109/48 (!) 111/53 (!) 110/53  Pulse: 77 78 77 70  Resp: 18 17 16 16   Temp: 98.1 F (36.7 C) 98.2 F (36.8 C) 98.1 F (36.7 C) 98.1 F (36.7 C)  TempSrc:   Oral Oral  SpO2: 98% 93% 94% 95%  Weight:      Height:        Intake/Output Summary (Last 24 hours) at 01/09/2021 1654 Last data filed at 01/09/2021 1417 Gross per 24 hour  Intake 900 ml  Output 600 ml  Net 300 ml   Filed Weights   12/27/20 1751 01/05/21 0407 01/07/21 0500  Weight: 104.3 kg 102.5 kg 101 kg    Examination:  Constitutional: NAD, AAOx3 HEENT: conjunctivae and lids normal, EOMI CV: No cyanosis.   RESP: normal respiratory effort, on RA Extremities: left foot wrapped, left toes swollen SKIN: warm, dry Neuro: II - XII grossly intact.     Data Reviewed: I have personally reviewed following labs and imaging studies  CBC: Recent Labs  Lab 01/04/21 0433 01/08/21 0442 01/09/21 0454  WBC 8.2 9.5 11.6*  NEUTROABS  --  5.8  --   HGB 8.8* 9.9* 8.2*  HCT 26.9* 30.3* 25.0*  MCV 96.1 97.1 98.0  PLT 284 398 337    Basic Metabolic Panel: Recent Labs  Lab 01/04/21 0433 01/08/21 0442 01/09/21 0454  NA 136 136 131*  K 4.0 3.9 4.0  CL 104 102 99  CO2 26 24 23   GLUCOSE 131* 131* 164*  BUN 26* 26* 24*  CREATININE 1.14 1.14 1.23  CALCIUM 8.0* 8.4* 7.9*  MG  --   --  1.7    GFR: Estimated Creatinine Clearance: 58.9 mL/min (by C-G formula based on SCr of 1.23 mg/dL).  Liver Function Tests: No results for input(s): AST, ALT, ALKPHOS, BILITOT, PROT, ALBUMIN in the last 168 hours.  CBG: Recent Labs  Lab 01/08/21 2132 01/09/21 0823 01/09/21 1128 01/09/21 1148 01/09/21 1636  GLUCAP 183* 151* 162* 166* 219*     No results found for this or any previous visit (from the past 240 hour(s)).       Radiology Studies: DG Ankle 2 Views Left  Result Date: 01/08/2021 CLINICAL DATA:  Surgery. EXAM: DG C-ARM 1-60 MIN; LEFT ANKLE - 2 VIEW FLUOROSCOPY TIME:  Fluoroscopy Time:  55 seconds. Number of  Acquired Spot Images: 3 COMPARISON:  Jan 07, 2021. FINDINGS: Three C-arm fluoroscopic images were obtained intraoperatively and submitted for post operative interpretation. These images demonstrate plate and screw fixation of the distal fibula and plate and screw fixation of the medial malleolus with additional single screw through the medial malleolar fracture. Improved alignment, near anatomic. Please see the performing provider's procedural report for further detail. IMPRESSION: Intraoperative fluoroscopy, as detailed above. Electronically Signed   By: 03/10/2021  Yetta Barre MD   On: 01/08/2021 11:36   DG C-Arm 1-60 Min  Result Date: 01/08/2021 CLINICAL DATA:  Surgery. EXAM: DG C-ARM 1-60 MIN; LEFT ANKLE - 2 VIEW FLUOROSCOPY TIME:  Fluoroscopy Time:  55 seconds. Number of Acquired Spot Images: 3 COMPARISON:  Jan 07, 2021. FINDINGS: Three C-arm fluoroscopic images were obtained intraoperatively and submitted for post operative interpretation. These images demonstrate plate and screw fixation of the distal fibula and plate and screw fixation of the medial malleolus with additional single screw through the medial malleolar fracture. Improved alignment, near anatomic. Please see the performing provider's procedural report for further detail. IMPRESSION: Intraoperative fluoroscopy, as detailed above. Electronically Signed   By: Feliberto Harts MD   On: 01/08/2021 11:36   DG C-Arm 1-60 Min  Result Date: 01/08/2021 CLINICAL DATA:  Surgery. EXAM: DG C-ARM 1-60 MIN; LEFT ANKLE - 2 VIEW FLUOROSCOPY TIME:  Fluoroscopy Time:  55 seconds. Number of Acquired Spot Images: 3 COMPARISON:  Jan 07, 2021. FINDINGS: Three C-arm fluoroscopic images were obtained intraoperatively and submitted for post operative interpretation. These images demonstrate plate and screw fixation of the distal fibula and plate and screw fixation of the medial malleolus with additional single screw through the medial malleolar fracture. Improved alignment,  near anatomic. Please see the performing provider's procedural report for further detail. IMPRESSION: Intraoperative fluoroscopy, as detailed above. Electronically Signed   By: Feliberto Harts MD   On: 01/08/2021 11:36        Scheduled Meds: . atorvastatin  80 mg Oral QHS  . docusate sodium  100 mg Oral BID  . enoxaparin (LOVENOX) injection  40 mg Subcutaneous Q24H  . escitalopram  20 mg Oral Daily  . feeding supplement  237 mL Oral BID BM  . ferrous sulfate  325 mg Oral BID WC  . insulin aspart  0-5 Units Subcutaneous QHS  . insulin aspart  0-9 Units Subcutaneous TID WC  . levothyroxine  25 mcg Oral Q0600  . triamcinolone cream   Topical TID   Continuous Infusions:    LOS: 12 days    Darlin Priestly, MD [facility]   To contact the attending provider between 7A-7P or the covering provider during after hours 7P-7A, please log into the web site [email or web address] and access using universal [place] password for that web site. If you do not have the password, please call the hospital operator.  01/09/2021, 4:54 PM

## 2021-01-09 NOTE — Progress Notes (Signed)
Physical Therapy Treatment Patient Details Name: Brad Santiago MRN: [id number] DOB: April 07, 1941 Today's Date: 01/09/2021    History of Present Illness Brad Santiago is a 80 y.o. male with medical history significant for hypertension, non-insulin-dependent diabetes mellitus, hypothyroid, hyperlipidemia, peripheral neuropathy, presents to the emergency department for chief concerns of passing out. Patient states he was sitting down on his chair when he felt weak in the last thing he remembered was seeing the coffee table about to catch his chin as he fell forward. He woke up on the floor.  He states he does not remember anything else.  Patient sustained a bimalleolar L ankle fracture. S/P external fixation on 4/26. Now with new therapy orders s/p ex-fix removal and ORIF 5/6.    PT Comments    Pt was supine in bed with LLE elevated on three pillows upon arriving. Pt presents with increased stuttering speech versus previously observed. Per pt," I get like this with my stutter sometimes. You don't have to worry about it." RN is aware. Pt had external fixator  Removed yesterday and does report increased pain since. Endorses 7/10 pain. " My brother used a knee scooter when he hurt his foot. Do you think I can use one now?" Therapist did demonstrate to pt's how knee scooter works however pt is not safe enough or able to adhere to proper wt bearing in standing so did not trial. Pt did stand one time EOB to RW but was not functional due to inability to maintain NWB.  Therapist educated pt on importance of adhering to NWB to allow proper healing. Pt states understanding. Did not want to get OOB to recliner. Author recommends lateral scooting for transfers at this time. Pt did perform there ex at EOB and while in bed.See exercises listed below. Acute PT will continue to follow and progress as able per POC. Highly recommend DC to SNF to improve independence with ADLs.    Follow Up Recommendations  SNF     Equipment  Recommendations   (defer to next level of care)       Precautions / Restrictions Precautions Precautions: Fall Restrictions Weight Bearing Restrictions: Yes LLE Weight Bearing: Non weight bearing    Mobility  Bed Mobility Overal bed mobility: Needs Assistance Bed Mobility: Supine to Sit;Sit to Supine     Supine to sit: Min assist;HOB elevated Sit to supine: Mod assist;HOB elevated   General bed mobility comments: Pt required min assist to exit bed and mod assist with returning. mostly pain limiting.    Transfers Overall transfer level: Needs assistance Equipment used: Rolling walker (2 wheeled) Transfers: Sit to/from Stand Sit to Stand: From elevated surface;Mod assist         General transfer comment: Pt wanted to attempt standing but unwilling to sit in recliner. He has been performing lateral transfers but wanted to see if he could stand to use knee scooter. Not safe at this time to trial due to inability to maintain NWB status. recommend continues lateral transfers until wt bearing restrictions are removed.  Ambulation/Gait             General Gait Details: unable/unsafe       Balance Overall balance assessment: Needs assistance Sitting-balance support: Feet supported         Cognition Arousal/Alertness: Awake/alert Behavior During Therapy: WFL for tasks assessed/performed Overall Cognitive Status: Within Functional Limits for tasks assessed    General Comments: Pt is A and O however has more significant stutter today with  speak. Per pt, this is not unusual      Exercises General Exercises - Lower Extremity Quad Sets: AROM;10 reps Gluteal Sets: AROM;10 reps Long Arc Quad: Seated;AROM;10 reps Hip ABduction/ADduction: AAROM;10 reps Straight Leg Raises: AAROM;10 reps        Pertinent Vitals/Pain Pain Assessment: 0-10 Pain Score: 7  Faces Pain Scale: Hurts even more Pain Location: L LE Pain Descriptors / Indicators: Grimacing;Operative site  guarding;Sore Pain Intervention(s): Limited activity within patient's tolerance;Monitored during session;Premedicated before session;Repositioned           PT Goals (current goals can now be found in the care plan section) Acute Rehab PT Goals Patient Stated Goal: to return home after rehab Progress towards PT goals: Progressing toward goals    Frequency    7X/week      PT Plan Current plan remains appropriate    Co-evaluation     PT goals addressed during session: Mobility/safety with mobility;Balance;Proper use of DME;Strengthening/ROM        AM-PAC PT "6 Clicks" Mobility   Outcome Measure  Help needed turning from your back to your side while in a flat bed without using bedrails?: A Little Help needed moving from lying on your back to sitting on the side of a flat bed without using bedrails?: A Lot Help needed moving to and from a bed to a chair (including a wheelchair)?: A Lot Help needed standing up from a chair using your arms (e.g., wheelchair or bedside chair)?: A Lot Help needed to walk in hospital room?: Total Help needed climbing 3-5 steps with a railing? : Total 6 Click Score: 11    End of Session Equipment Utilized During Treatment: Gait belt Activity Tolerance: Patient limited by pain;Patient limited by fatigue Patient left: in bed;with call bell/phone within reach;with bed alarm set Nurse Communication: Mobility status PT Visit Diagnosis: Other abnormalities of gait and mobility (R26.89);Muscle weakness (generalized) (M62.81);History of falling (Z91.81);Pain Pain - Right/Left: Left Pain - part of body: Ankle and joints of foot     Time: 0950-1013 PT Time Calculation (min) (ACUTE ONLY): 23 min  Charges:  $Therapeutic Exercise: 8-22 mins $Therapeutic Activity: 8-22 mins                     Jetta Lout PTA 01/09/21, 10:30 AM

## 2021-01-09 NOTE — Progress Notes (Signed)
Subjective: 1 Day Post-Op Procedure(s) (LRB): OPEN REDUCTION INTERNAL FIXATION (ORIF) TRIMALLEOLAR ANKLE FRACTURE; Removal of External Fixation (Left) Patient is 1 day post ORIF of the left trimalleolar ankle fracture.  He is sitting up in bed and doing well.  Pain is minimal.  Has not been out of bed yet.  Hemoglobin stable.  Patient reports pain as mild.  Objective:   VITALS:   Vitals:   01/09/21 0442 01/09/21 0836  BP: (!) 109/48 (!) 111/53  Pulse: 78 77  Resp: 17 16  Temp: 98.2 F (36.8 C) 98.1 F (36.7 C)  SpO2: 93% 94%    Neurologically intact Sensation intact distally Incision: dressing C/D/I  LABS Recent Labs    01/08/21 0442 01/09/21 0454  HGB 9.9* 8.2*  HCT 30.3* 25.0*  WBC 9.5 11.6*  PLT 398 337    Recent Labs    01/08/21 0442 01/09/21 0454  NA 136 131*  K 3.9 4.0  BUN 26* 24*  CREATININE 1.14 1.23  GLUCOSE 131* 164*    No results for input(s): LABPT, INR in the last 72 hours.   Assessment/Plan: 1 Day Post-Op Procedure(s) (LRB): OPEN REDUCTION INTERNAL FIXATION (ORIF) TRIMALLEOLAR ANKLE FRACTURE; Removal of External Fixation (Left)   Advance diet Up with therapy Discharge to SNF

## 2021-01-09 NOTE — Plan of Care (Signed)
  Problem: Activity: Goal: Risk for activity intolerance will decrease Outcome: Progressing   Problem: Coping: Goal: Level of anxiety will decrease Outcome: Progressing   Problem: Nutrition: Goal: Adequate nutrition will be maintained Outcome: Progressing   Problem: Pain Managment: Goal: General experience of comfort will improve Outcome: Progressing   Problem: Safety: Goal: Ability to remain free from injury will improve Outcome: Progressing   Problem: Skin Integrity: Goal: Risk for impaired skin integrity will decrease Outcome: Progressing

## 2021-01-09 NOTE — Progress Notes (Signed)
PHARMACIST - PHYSICIAN COMMUNICATION  CONCERNING:  Enoxaparin (Lovenox) for DVT Prophylaxis    RECOMMENDATION: Patient was prescribed enoxaprin 40mg  q24 hours for VTE prophylaxis.   Filed Weights   12/27/20 1751 01/05/21 0407 01/07/21 0500  Weight: 104.3 kg (230 lb) 102.5 kg (225 lb 15.5 oz) 101 kg (222 lb 10.6 oz)    Body mass index is 30.2 kg/m.  Estimated Creatinine Clearance: 58.9 mL/min (by C-G formula based on SCr of 1.23 mg/dL).   Patient is candidate for enoxaparin 40mg  every 24 hours based on CrCl > 101ml/min or Weight > 45kg  DESCRIPTION: Pharmacy has adjusted enoxaparin dose per [facility] policy.  Patient is now receiving enoxaparin 40 mg every 24 hours    31m, PharmD Clinical Pharmacist  01/09/2021 9:11 AM

## 2021-01-09 NOTE — Anesthesia Postprocedure Evaluation (Signed)
Anesthesia Post Note  Patient: Brad Santiago  Procedure(s) Performed: OPEN REDUCTION INTERNAL FIXATION (ORIF) TRIMALLEOLAR ANKLE FRACTURE; Removal of External Fixation (Left Ankle)  Patient location during evaluation: PACU Anesthesia Type: General Level of consciousness: awake and alert and oriented Pain management: pain level controlled Vital Signs Assessment: post-procedure vital signs reviewed and stable Respiratory status: spontaneous breathing Cardiovascular status: blood pressure returned to baseline Anesthetic complications: no   No complications documented.   Last Vitals:  Vitals:   01/09/21 0836 01/09/21 1505  BP: (!) 111/53 (!) 110/53  Pulse: 77 70  Resp: 16 16  Temp: 36.7 C 36.7 C  SpO2: 94% 95%    Last Pain:  Vitals:   01/09/21 1505  TempSrc: Oral  PainSc:                  Sami Roes

## 2021-01-10 DIAGNOSIS — D509 Iron deficiency anemia, unspecified: Secondary | ICD-10-CM | POA: Diagnosis not present

## 2021-01-10 DIAGNOSIS — S82892S Other fracture of left lower leg, sequela: Secondary | ICD-10-CM | POA: Diagnosis not present

## 2021-01-10 LAB — GLUCOSE, CAPILLARY
Glucose-Capillary: 141 mg/dL — ABNORMAL HIGH (ref 70–99)
Glucose-Capillary: 181 mg/dL — ABNORMAL HIGH (ref 70–99)
Glucose-Capillary: 172 mg/dL — ABNORMAL HIGH (ref 70–99)
Glucose-Capillary: 191 mg/dL — ABNORMAL HIGH (ref 70–99)

## 2021-01-10 LAB — BASIC METABOLIC PANEL
Anion gap: 7 (ref 5–15)
BUN: 20 mg/dL (ref 8–23)
CO2: 24 mmol/L (ref 22–32)
Calcium: 8.1 mg/dL — ABNORMAL LOW (ref 8.9–10.3)
Chloride: 104 mmol/L (ref 98–111)
Creatinine, Ser: 1.21 mg/dL (ref 0.61–1.24)
GFR, Estimated: >60 mL/min (ref >60)
Glucose, Bld: 130 mg/dL — ABNORMAL HIGH (ref 70–99)
Potassium: 3.8 mmol/L (ref 3.5–5.1)
Sodium: 135 mmol/L (ref 135–145)

## 2021-01-10 LAB — CBC
HCT: 25.3 % — ABNORMAL LOW (ref 39.0–52.0)
Hemoglobin: 8.3 g/dL — ABNORMAL LOW (ref 13.0–17.0)
MCH: 31.6 pg (ref 26.0–34.0)
MCHC: 32.8 g/dL (ref 30.0–36.0)
MCV: 96.2 fL (ref 80.0–100.0)
Platelets: 335 10*3/uL (ref 150–400)
RBC: 2.63 MIL/uL — ABNORMAL LOW (ref 4.22–5.81)
RDW: 13.6 % (ref 11.5–15.5)
WBC: 11.3 10*3/uL — ABNORMAL HIGH (ref 4.0–10.5)
nRBC: 0 % (ref 0.0–0.2)

## 2021-01-10 LAB — MAGNESIUM: Magnesium: 1.8 mg/dL (ref 1.7–2.4)

## 2021-01-10 MED ORDER — MORPHINE SULFATE (PF) 2 MG/ML IV SOLN
2.0000 mg | Freq: Three times a day (TID) | INTRAVENOUS | Status: DC | PRN
  Administered 2021-01-11: 2 mg via INTRAVENOUS
  Filled 2021-01-10: qty 1

## 2021-01-10 MED ORDER — MORPHINE SULFATE (PF) 2 MG/ML IV SOLN: 2.0000 mg | Freq: Three times a day (TID) | INTRAVENOUS | Status: DC | PRN

## 2021-01-10 MED ORDER — FOLIC ACID 1 MG PO TABS
1.0000 mg | ORAL_TABLET | Freq: Every day | ORAL | Status: DC
  Administered 2021-01-10 – 2021-01-11 (×2): 1 mg via ORAL
  Filled 2021-01-10 (×2): qty 1

## 2021-01-10 NOTE — Progress Notes (Signed)
PROGRESS NOTE    Brad Santiago  BVQ:[id number] DOB: [birth year] DOA: 12/27/2020 PCP: Dorothey Baseman, MD    Chief Complaint  Patient presents with  . Ankle Pain  . Fall         Brief Narrative:  Patient admitted 12/27/2020 with syncope and hypoglycemic episode and found to have a left ankle fracture. On 12/29/2020 patient had external fixator to the left ankle by Dr. Okey Dupre. He wanted to wait 7 to 10 days for ORIF procedures secondary to blisters on the skin, which started improving and now going for ORIF on 01/08/2021. Patient had a bed offer at [facility].  Assessment & Plan:   Active Problems:   Closed left ankle fracture   Type 2 diabetes mellitus with hyperlipidemia (HCC)   Essential hypertension   Hyperlipidemia   Hypothyroidism   Acute kidney injury superimposed on CKD (HCC)   Acute metabolic encephalopathy   Cat scratch   Hyperkalemia   Depression   Weakness   Rash   Anemia   Closed trimalleolar fracture of left ankle   Syncope and collapse  1 closed left trimalleolar ankle fracture S/p External fixation on 12/29/20 and ORFI on 01/08/21 -with Dr. Okey Dupre. -waited for skin wound to improve prior to definitive procedure. Plan: -decrease frequency of PRN IV morphine, since pt was very sleepy --PT --rec discharge to SNF  2.  Bilateral lower extremity cat scratches -Status post 5 days azithromycin.  No further antibiotics needed.  3.  Erythematous rash on the back with itching -Likely secondary to contact dermatitis and excessive sweating. -Improved on current regimen of triamcinolone cream and Atarax. --cont triamcinolone cream and atarax PRN  4.  Well-controlled type 2 diabetes mellitus with hyperlipidemia -Hemoglobin A1c 6.1 (12/27/2020) -Discontinue Actos --SSI -Continue statin.  5.  Acute kidney injury on chronic kidney disease stage II -Resolved.  6.  Iron deficiency anemia --anemia workup shoed both iron and folate def --cont iron  suppl --start folic acid suppl via oral  7.  Hypothyroidism --cont Synthroid   DVT prophylaxis: Lovenox SQ Code Status: Full code  Family Communication:  Status is: inpatient Dispo:   The patient is from: home Anticipated d/c is to: SNF Anticipated d/c date is: whenever bed available Patient currently is medically stable to d/c.   Consultants:   Orthopedics: Dr. Okey Dupre 12/28/2020  Wound care RN Evlyn Kanner 12/31/2020  Procedures:   CT head CT maxillofacial 12/27/2020  Plain films of the left ankle 12/27/2020, 12/29/2020, 01/07/2021  Left ankle spanning external fixator per Dr. Okey Dupre 12/29/2020  Antimicrobials:  Anti-infectives (From admission, onward)   Start     Dose/Rate Route Frequency Ordered Stop   01/08/21 1400  ceFAZolin (ANCEF) IVPB 2g/100 mL premix        2 g 200 mL/hr over 30 Minutes Intravenous Every 8 hours 01/08/21 1128 01/09/21 0603   01/08/21 0600  ceFAZolin (ANCEF) powder 2 g  Status:  Discontinued        2 g Other To Surgery 01/07/21 1658 01/07/21 1703   01/08/21 0600  ceFAZolin (ANCEF) IVPB 2g/100 mL premix        2 g 200 mL/hr over 30 Minutes Intravenous On call to O.R. 01/07/21 1703 01/08/21 0747   12/31/20 1000  amoxicillin-clavulanate (AUGMENTIN) 875-125 MG per tablet 1 tablet  Status:  Discontinued        1 tablet Oral Every 12 hours 12/30/20 1204 12/30/20 1606   12/31/20 1000  azithromycin (ZITHROMAX) tablet 250 mg       "  Followed by" Linked Group Details   250 mg Oral Daily 12/30/20 1606 01/03/21 0822   12/30/20 1700  azithromycin (ZITHROMAX) tablet 500 mg       "Followed by" Linked Group Details   500 mg Oral Daily 12/30/20 1606 12/30/20 1810   12/29/20 2000  ceFAZolin (ANCEF) 1 g in sodium chloride 0.9 % 100 mL IVPB        1 g 200 mL/hr over 30 Minutes Intravenous Every 8 hours 12/29/20 1551 12/30/20 1402   12/29/20 0000  ceFAZolin (ANCEF) powder 2 g        2 g Other To Surgery 12/28/20 1751 12/29/20 1257       Subjective: Pt was  sleeping, and difficult to wake up, but nodded yes to asking if he had pain.  Looks like pt has been receiving more IV morphine than oral Percocet.   Objective: Vitals:   01/10/21 0502 01/10/21 0742 01/10/21 1630 01/10/21 2013  BP: (!) 97/52 (!) 103/58 (!) 104/50 (!) 113/56  Pulse: 66 64 61 63  Resp: 17 17 18 18   Temp: 98.5 F (36.9 C) 98 F (36.7 C)  98.5 F (36.9 C)  TempSrc:      SpO2: 93% 93% 96% 96%  Weight:      Height:        Intake/Output Summary (Last 24 hours) at 01/10/2021 2343 Last data filed at 01/10/2021 1855 Gross per 24 hour  Intake 720 ml  Output 425 ml  Net 295 ml   Filed Weights   12/27/20 1751 01/05/21 0407 01/07/21 0500  Weight: 104.3 kg 102.5 kg 101 kg    Examination:  Constitutional: NAD, sleeping, hard to wake CV: No cyanosis.   RESP: normal respiratory effort, on RA Extremities: left foot wrapped, left toes swollen SKIN: warm, dry   Data Reviewed: I have personally reviewed following labs and imaging studies  CBC: Recent Labs  Lab 01/04/21 0433 01/08/21 0442 01/09/21 0454 01/10/21 0459  WBC 8.2 9.5 11.6* 11.3*  NEUTROABS  --  5.8  --   --   HGB 8.8* 9.9* 8.2* 8.3*  HCT 26.9* 30.3* 25.0* 25.3*  MCV 96.1 97.1 98.0 96.2  PLT 284 398 337 335    Basic Metabolic Panel: Recent Labs  Lab 01/04/21 0433 01/08/21 0442 01/09/21 0454 01/10/21 0459  NA 136 136 131* 135  K 4.0 3.9 4.0 3.8  CL 104 102 99 104  CO2 26 24 23 24   GLUCOSE 131* 131* 164* 130*  BUN 26* 26* 24* 20  CREATININE 1.14 1.14 1.23 1.21  CALCIUM 8.0* 8.4* 7.9* 8.1*  MG  --   --  1.7 1.8    GFR: Estimated Creatinine Clearance: 59.9 mL/min (by C-G formula based on SCr of 1.21 mg/dL).  Liver Function Tests: No results for input(s): AST, ALT, ALKPHOS, BILITOT, PROT, ALBUMIN in the last 168 hours.  CBG: Recent Labs  Lab 01/09/21 2104 01/10/21 0818 01/10/21 1155 01/10/21 1628 01/10/21 2206  GLUCAP 187* 141* 181* 172* 191*     No results found for this or any  previous visit (from the past 240 hour(s)).       Radiology Studies: No results found.      Scheduled Meds: . atorvastatin  80 mg Oral QHS  . docusate sodium  100 mg Oral BID  . enoxaparin (LOVENOX) injection  40 mg Subcutaneous Q24H  . escitalopram  20 mg Oral Daily  . feeding supplement  237 mL Oral BID BM  . ferrous sulfate  325 mg Oral BID WC  . folic acid  1 mg Oral Daily  . insulin aspart  0-5 Units Subcutaneous QHS  . insulin aspart  0-9 Units Subcutaneous TID WC  . levothyroxine  25 mcg Oral Q0600  . triamcinolone cream   Topical TID   Continuous Infusions:    LOS: 13 days    Darlin Priestly, MD [facility]   To contact the attending provider between 7A-7P or the covering provider during after hours 7P-7A, please log into the web site [email or web address] and access using universal [place] password for that web site. If you do not have the password, please call the hospital operator.  01/10/2021, 11:43 PM

## 2021-01-10 NOTE — Progress Notes (Signed)
Physical Therapy Treatment Patient Details Name: Brad Santiago MRN: [id number] DOB: 07/26/1941 Today's Date: 01/10/2021    History of Present Illness Brad Santiago is a 80 y.o. male with medical history significant for hypertension, non-insulin-dependent diabetes mellitus, hypothyroid, hyperlipidemia, peripheral neuropathy, presents to the emergency department for chief concerns of passing out. Patient states he was sitting down on his chair when he felt weak in the last thing he remembered was seeing the coffee table about to catch his chin as he fell forward. He woke up on the floor.  He states he does not remember anything else.  Patient sustained a bimalleolar L ankle fracture. S/P external fixation on 4/26. Now with new therapy orders s/p ex-fix removal and ORIF 5/6.    PT Comments    Co-tx with OT.  For transfers/care.  Pt asking to get to commode.  Obtained drop arm commode for transfer.  To EOB with mod verbal cues for sequencing and encouragement to continue movement as he gets easily distracted talking.  Steady in sitting.  He is able to lateral scoot to drop arm recliner with occasional min assist +2 to clear hips and plastic seat with bare skin.  He is unable to have BM but does void.  OT in for bathing interventions.  He returns to bed after mobility with similar assist.      Follow Up Recommendations  SNF     Equipment Recommendations   (defer to next level of care)    Recommendations for Other Services       Precautions / Restrictions Precautions Precautions: Fall Restrictions Weight Bearing Restrictions: Yes LLE Weight Bearing: Non weight bearing    Mobility  Bed Mobility Overal bed mobility: Needs Assistance Bed Mobility: Supine to Sit;Sit to Supine     Supine to sit: Min assist Sit to supine: Mod assist   General bed mobility comments: increased time and cues    Transfers Overall transfer level: Needs assistance Equipment used: Rolling walker (2  wheeled) Transfers: Lateral/Scoot Transfers          Lateral/Scoot Transfers: Min assist;+2 physical assistance General transfer comment: lateral scoot to drop arm commode +2 assist to clear  Ambulation/Gait             General Gait Details: unable/unsafe   Stairs             Wheelchair Mobility    Modified Rankin (Stroke Patients Only)       Balance Overall balance assessment: Needs assistance Sitting-balance support: Feet supported Sitting balance-Leahy Scale: Fair                                      Cognition Arousal/Alertness: Awake/alert Behavior During Therapy: WFL for tasks assessed/performed Overall Cognitive Status: Within Functional Limits for tasks assessed                                        Exercises      General Comments        Pertinent Vitals/Pain Pain Assessment: Faces Faces Pain Scale: Hurts little more Pain Location: L LE Pain Descriptors / Indicators: Grimacing;Operative site guarding;Sore Pain Intervention(s): Limited activity within patient's tolerance;Monitored during session;Premedicated before session;Repositioned    Home Living  Prior Function            PT Goals (current goals can now be found in the care plan section) Progress towards PT goals: Progressing toward goals    Frequency    7X/week      PT Plan Current plan remains appropriate    Co-evaluation PT/OT/SLP Co-Evaluation/Treatment: Yes Reason for Co-Treatment: To address functional/ADL transfers;For patient/therapist safety PT goals addressed during session: Mobility/safety with mobility OT goals addressed during session: ADL's and self-care      AM-PAC PT "6 Clicks" Mobility   Outcome Measure  Help needed turning from your back to your side while in a flat bed without using bedrails?: A Little Help needed moving from lying on your back to sitting on the side of a flat bed  without using bedrails?: A Lot Help needed moving to and from a bed to a chair (including a wheelchair)?: A Lot Help needed standing up from a chair using your arms (e.g., wheelchair or bedside chair)?: A Lot Help needed to walk in hospital room?: Total Help needed climbing 3-5 steps with a railing? : Total 6 Click Score: 11    End of Session Equipment Utilized During Treatment: Gait belt Activity Tolerance: Patient limited by pain;Patient limited by fatigue Patient left: in bed;with call bell/phone within reach;with bed alarm set Nurse Communication: Mobility status PT Visit Diagnosis: Other abnormalities of gait and mobility (R26.89);Muscle weakness (generalized) (M62.81);History of falling (Z91.81);Pain Pain - Right/Left: Left Pain - part of body: Ankle and joints of foot     Time: [id number] PT Time Calculation (min) (ACUTE ONLY): 42 min  Charges:  $Therapeutic Activity: 23-37 mins                    Danielle Dess, PTA 01/10/21, 12:41 PM

## 2021-01-10 NOTE — Progress Notes (Signed)
Occupational Therapy Treatment Patient Details Name: Brad Santiago MRN: [id number] DOB: [birth year] Today's Date: 01/10/2021    History of present illness Brad Santiago is a 80 y.o. male with medical history significant for hypertension, non-insulin-dependent diabetes mellitus, hypothyroid, hyperlipidemia, peripheral neuropathy, presents to the emergency department for chief concerns of passing out. Patient states he was sitting down on his chair when he felt weak in the last thing he remembered was seeing the coffee table about to catch his chin as he fell forward. He woke up on the floor.  He states he does not remember anything else.  Patient sustained a bimalleolar L ankle fracture. S/P external fixation on 4/26. Now with new therapy orders s/p ex-fix removal and ORIF 5/6.   OT comments  Pt seen for OT/PT co-treatment this date to f/u re: safety with ADLs/ADL mobility. Pt requires MIN A +2 to scoot transfer from EOB sitting to commode. While seated on commode, OT engages pt in UB bathing/dressing in sitting with SETUP. MOD/MAX A for LB bathing, peri care, and dressing using sit/lateral lean technique with education from OT re: modification. Pt able to assist with leaning to his L elbow to allow for completion of posterior LB ADLs. Pt requires increased assist to scoot transfer back to bed d/t scooting towards post-op LE as well as slightly higher surface. Pt requires MOD A to get back in bed, OT attempts to educate re: use of leg lifter to assist with bed mobility and pt still requires MOD A to manage post op L LE. Pt left in bed with all needs met and in reach, PTA finishing session. Will continue to follow acutely. Continue to recommend STR f/u upon d/c from hospital.    Follow Up Recommendations  SNF    Equipment Recommendations  3 in 1 bedside commode;Tub/shower seat;Other (comment)    Recommendations for Other Services      Precautions / Restrictions Precautions Precautions:  Fall Restrictions Weight Bearing Restrictions: Yes LLE Weight Bearing: Non weight bearing       Mobility Bed Mobility Overal bed mobility: Needs Assistance Bed Mobility: Supine to Sit;Sit to Supine     Supine to sit: Min assist Sit to supine: Mod assist   General bed mobility comments: pt up to EOB sitting with PTA when OT enters session    Transfers Overall transfer level: Needs assistance Equipment used: Rolling walker (2 wheeled) Transfers: Lateral/Scoot Transfers Sit to Stand: From elevated surface;Mod assist        Lateral/Scoot Transfers: Min assist;+2 physical assistance General transfer comment: MIN A for scoot from bed to [facility] with drop arm, more assist required to get back to EOB d/t increased height as well as decreased firmness of surface and scooting towards pt's weaker LE.    Balance Overall balance assessment: Needs assistance Sitting-balance support: Feet supported Sitting balance-Leahy Scale: Fair Sitting balance - Comments: some UE support utilized as pt is somewhat drowsy/fatigued and less sturdy in static sitting       Standing balance comment: deferred                           ADL either performed or assessed with clinical judgement   ADL Overall ADL's : Needs assistance/impaired         Upper Body Bathing: Minimal assistance;Sitting   Lower Body Bathing: Maximal assistance;Sitting/lateral leans Lower Body Bathing Details (indicate cue type and reason): ed re: lateral lean technique and bottom budy to  perform LB bathing and posterior peri care effectively and MOD I Upper Body Dressing : Set up;Sitting   Lower Body Dressing: Maximal assistance;Sitting/lateral leans   Toilet Transfer: Moderate assistance;Maximal assistance;+2 for safety/equipment;+2 for physical assistance Toilet Transfer Details (indicate cue type and reason): scoot transfer from bed to drop-arm commode toward his R (good LE) he only requires CGA/MIN A +2, but  requires MOD/MAX A +2 to come from commode to bed towards his post op LE. Toileting- Clothing Manipulation and Hygiene: Maximal assistance;Sitting/lateral lean Toileting - Clothing Manipulation Details (indicate cue type and reason): ed re: lateral lean method             Vision Patient Visual Report: No change from baseline     Perception     Praxis      Cognition Arousal/Alertness: Awake/alert Behavior During Therapy: WFL for tasks assessed/performed Overall Cognitive Status: Within Functional Limits for tasks assessed                                 General Comments: Pt is A and O however has more significant stutter today with speak. Per pt, this is not unusual        Exercises Other Exercises Other Exercises: UB bathing/dressing in sitting with SETUP. MOD/MAX A for LB bathing, peri care, and dressing using sit/lateral lean technique with education from OT re: modification.   Shoulder Instructions       General Comments      Pertinent Vitals/ Pain       Pain Assessment: Faces Faces Pain Scale: Hurts little more Pain Location: L LE Pain Descriptors / Indicators: Grimacing;Operative site guarding;Sore Pain Intervention(s): Limited activity within patient's tolerance;Monitored during session  Home Living                                          Prior Functioning/Environment              Frequency  Min 2X/week        Progress Toward Goals  OT Goals(current goals can now be found in the care plan section)  Progress towards OT goals: Progressing toward goals  Acute Rehab OT Goals Patient Stated Goal: to return home after rehab OT Goal Formulation: With patient Time For Goal Achievement: 01/22/21 Potential to Achieve Goals: Good  Plan Discharge plan remains appropriate;Frequency needs to be updated    Co-evaluation    PT/OT/SLP Co-Evaluation/Treatment: Yes Reason for Co-Treatment: To address functional/ADL  transfers;For patient/therapist safety PT goals addressed during session: Mobility/safety with mobility OT goals addressed during session: ADL's and self-care      AM-PAC OT "6 Clicks" Daily Activity     Outcome Measure   Help from another person eating meals?: None Help from another person taking care of personal grooming?: A Little Help from another person toileting, which includes using toliet, bedpan, or urinal?: A Lot Help from another person bathing (including washing, rinsing, drying)?: A Lot Help from another person to put on and taking off regular upper body clothing?: A Little Help from another person to put on and taking off regular lower body clothing?: A Lot 6 Click Score: 16    End of Session Equipment Utilized During Treatment: Gait belt  OT Visit Diagnosis: Unsteadiness on feet (R26.81);Muscle weakness (generalized) (M62.81);History of falling (Z91.81)   Activity Tolerance Patient tolerated  treatment well   Patient Left in bed;with call bell/phone within reach;with nursing/sitter in room   Nurse Communication Mobility status        Time: 3428-7681 OT Time Calculation (min): 16 min  Charges: OT General Charges $OT Visit: 1 Visit OT Treatments $Self Care/Home Management : 8-22 mins  Gerrianne Scale, Mertens, OTR/L ascom [phone] 01/10/21, 2:10 PM

## 2021-01-10 NOTE — Progress Notes (Signed)
Subjective: 2 Days Post-Op Procedure(s) (LRB): OPEN REDUCTION INTERNAL FIXATION (ORIF) TRIMALLEOLAR ANKLE FRACTURE; Removal of External Fixation (Left) Patient is awake and sitting up in bed.  He is comfortable.  He has been up in a chair.  Dressings remain dry.  He is awaiting rehab placement.  Patient reports pain as mild.  Objective:   VITALS:   Vitals:   01/10/21 0502 01/10/21 0742  BP: (!) 97/52 (!) 103/58  Pulse: 66 64  Resp: 17 17  Temp: 98.5 F (36.9 C) 98 F (36.7 C)  SpO2: 93% 93%    Neurologically intact Incision: dressing C/D/I  LABS Recent Labs    01/08/21 0442 01/09/21 0454 01/10/21 0459  HGB 9.9* 8.2* 8.3*  HCT 30.3* 25.0* 25.3*  WBC 9.5 11.6* 11.3*  PLT 398 337 335    Recent Labs    01/08/21 0442 01/09/21 0454 01/10/21 0459  NA 136 131* 135  K 3.9 4.0 3.8  BUN 26* 24* 20  CREATININE 1.14 1.23 1.21  GLUCOSE 131* 164* 130*    No results for input(s): LABPT, INR in the last 72 hours.   Assessment/Plan: 2 Days Post-Op Procedure(s) (LRB): OPEN REDUCTION INTERNAL FIXATION (ORIF) TRIMALLEOLAR ANKLE FRACTURE; Removal of External Fixation (Left)   Up with therapy Discharge to SNF

## 2021-01-11 ENCOUNTER — Encounter: Payer: Self-pay | Admitting: Orthopaedic Surgery

## 2021-01-11 DIAGNOSIS — E119 Type 2 diabetes mellitus without complications: Secondary | ICD-10-CM | POA: Diagnosis not present

## 2021-01-11 DIAGNOSIS — W19XXXA Unspecified fall, initial encounter: Secondary | ICD-10-CM | POA: Diagnosis not present

## 2021-01-11 DIAGNOSIS — E162 Hypoglycemia, unspecified: Secondary | ICD-10-CM | POA: Diagnosis not present

## 2021-01-11 DIAGNOSIS — S82892S Other fracture of left lower leg, sequela: Secondary | ICD-10-CM

## 2021-01-11 DIAGNOSIS — R55 Syncope and collapse: Secondary | ICD-10-CM | POA: Diagnosis not present

## 2021-01-11 DIAGNOSIS — Z9889 Other specified postprocedural states: Secondary | ICD-10-CM | POA: Diagnosis not present

## 2021-01-11 DIAGNOSIS — R262 Difficulty in walking, not elsewhere classified: Secondary | ICD-10-CM | POA: Diagnosis not present

## 2021-01-11 DIAGNOSIS — E038 Other specified hypothyroidism: Secondary | ICD-10-CM | POA: Diagnosis not present

## 2021-01-11 DIAGNOSIS — M6281 Muscle weakness (generalized): Secondary | ICD-10-CM | POA: Diagnosis not present

## 2021-01-11 DIAGNOSIS — D509 Iron deficiency anemia, unspecified: Secondary | ICD-10-CM | POA: Diagnosis not present

## 2021-01-11 DIAGNOSIS — N179 Acute kidney failure, unspecified: Secondary | ICD-10-CM | POA: Diagnosis not present

## 2021-01-11 DIAGNOSIS — W5503XA Scratched by cat, initial encounter: Secondary | ICD-10-CM | POA: Diagnosis not present

## 2021-01-11 DIAGNOSIS — G9341 Metabolic encephalopathy: Secondary | ICD-10-CM | POA: Diagnosis not present

## 2021-01-11 DIAGNOSIS — E1159 Type 2 diabetes mellitus with other circulatory complications: Secondary | ICD-10-CM | POA: Diagnosis not present

## 2021-01-11 DIAGNOSIS — E785 Hyperlipidemia, unspecified: Secondary | ICD-10-CM | POA: Diagnosis not present

## 2021-01-11 DIAGNOSIS — Z4789 Encounter for other orthopedic aftercare: Secondary | ICD-10-CM | POA: Diagnosis not present

## 2021-01-11 DIAGNOSIS — S82892D Other fracture of left lower leg, subsequent encounter for closed fracture with routine healing: Secondary | ICD-10-CM | POA: Diagnosis not present

## 2021-01-11 DIAGNOSIS — D649 Anemia, unspecified: Secondary | ICD-10-CM | POA: Diagnosis not present

## 2021-01-11 DIAGNOSIS — F4320 Adjustment disorder, unspecified: Secondary | ICD-10-CM | POA: Diagnosis not present

## 2021-01-11 DIAGNOSIS — R41841 Cognitive communication deficit: Secondary | ICD-10-CM | POA: Diagnosis not present

## 2021-01-11 DIAGNOSIS — D508 Other iron deficiency anemias: Secondary | ICD-10-CM | POA: Diagnosis not present

## 2021-01-11 DIAGNOSIS — K5901 Slow transit constipation: Secondary | ICD-10-CM | POA: Diagnosis not present

## 2021-01-11 DIAGNOSIS — S82852A Displaced trimalleolar fracture of left lower leg, initial encounter for closed fracture: Secondary | ICD-10-CM | POA: Diagnosis not present

## 2021-01-11 DIAGNOSIS — S82842D Displaced bimalleolar fracture of left lower leg, subsequent encounter for closed fracture with routine healing: Secondary | ICD-10-CM | POA: Diagnosis not present

## 2021-01-11 DIAGNOSIS — S82392S Other fracture of lower end of left tibia, sequela: Secondary | ICD-10-CM | POA: Diagnosis not present

## 2021-01-11 DIAGNOSIS — R404 Transient alteration of awareness: Secondary | ICD-10-CM | POA: Diagnosis not present

## 2021-01-11 DIAGNOSIS — S82853A Displaced trimalleolar fracture of unspecified lower leg, initial encounter for closed fracture: Secondary | ICD-10-CM | POA: Diagnosis not present

## 2021-01-11 DIAGNOSIS — F32A Depression, unspecified: Secondary | ICD-10-CM | POA: Diagnosis not present

## 2021-01-11 DIAGNOSIS — E1142 Type 2 diabetes mellitus with diabetic polyneuropathy: Secondary | ICD-10-CM | POA: Diagnosis not present

## 2021-01-11 DIAGNOSIS — Z8781 Personal history of (healed) traumatic fracture: Secondary | ICD-10-CM

## 2021-01-11 DIAGNOSIS — R279 Unspecified lack of coordination: Secondary | ICD-10-CM | POA: Diagnosis not present

## 2021-01-11 DIAGNOSIS — Z743 Need for continuous supervision: Secondary | ICD-10-CM | POA: Diagnosis not present

## 2021-01-11 DIAGNOSIS — M1991 Primary osteoarthritis, unspecified site: Secondary | ICD-10-CM | POA: Diagnosis not present

## 2021-01-11 DIAGNOSIS — R278 Other lack of coordination: Secondary | ICD-10-CM | POA: Diagnosis not present

## 2021-01-11 DIAGNOSIS — I152 Hypertension secondary to endocrine disorders: Secondary | ICD-10-CM | POA: Diagnosis not present

## 2021-01-11 DIAGNOSIS — I10 Essential (primary) hypertension: Secondary | ICD-10-CM | POA: Diagnosis not present

## 2021-01-11 DIAGNOSIS — R2681 Unsteadiness on feet: Secondary | ICD-10-CM | POA: Diagnosis not present

## 2021-01-11 DIAGNOSIS — E782 Mixed hyperlipidemia: Secondary | ICD-10-CM | POA: Diagnosis not present

## 2021-01-11 DIAGNOSIS — E039 Hypothyroidism, unspecified: Secondary | ICD-10-CM | POA: Diagnosis not present

## 2021-01-11 LAB — BASIC METABOLIC PANEL
Anion gap: 5 (ref 5–15)
BUN: 21 mg/dL (ref 8–23)
CO2: 29 mmol/L (ref 22–32)
Calcium: 8.4 mg/dL — ABNORMAL LOW (ref 8.9–10.3)
Chloride: 102 mmol/L (ref 98–111)
Creatinine, Ser: 1.19 mg/dL (ref 0.61–1.24)
GFR, Estimated: >60 mL/min (ref >60)
Glucose, Bld: 123 mg/dL — ABNORMAL HIGH (ref 70–99)
Potassium: 4.3 mmol/L (ref 3.5–5.1)
Sodium: 136 mmol/L (ref 135–145)

## 2021-01-11 LAB — CBC
HCT: 26.5 % — ABNORMAL LOW (ref 39.0–52.0)
Hemoglobin: 8.4 g/dL — ABNORMAL LOW (ref 13.0–17.0)
MCH: 31.5 pg (ref 26.0–34.0)
MCHC: 31.7 g/dL (ref 30.0–36.0)
MCV: 99.3 fL (ref 80.0–100.0)
Platelets: 363 10*3/uL (ref 150–400)
RBC: 2.67 MIL/uL — ABNORMAL LOW (ref 4.22–5.81)
RDW: 13.7 % (ref 11.5–15.5)
WBC: 9.9 10*3/uL (ref 4.0–10.5)
nRBC: 0 % (ref 0.0–0.2)

## 2021-01-11 LAB — RESP PANEL BY RT-PCR (FLU A&B, COVID) ARPGX2
Influenza A by PCR: NEGATIVE
Influenza B by PCR: NEGATIVE
SARS Coronavirus 2 by RT PCR: NEGATIVE

## 2021-01-11 LAB — GLUCOSE, CAPILLARY
Glucose-Capillary: 121 mg/dL — ABNORMAL HIGH (ref 70–99)
Glucose-Capillary: 161 mg/dL — ABNORMAL HIGH (ref 70–99)

## 2021-01-11 LAB — MAGNESIUM: Magnesium: 1.8 mg/dL (ref 1.7–2.4)

## 2021-01-11 MED ORDER — FOLIC ACID 1 MG PO TABS
1.0000 mg | ORAL_TABLET | Freq: Every day | ORAL | Status: AC
Start: 2021-01-11 — End: ?

## 2021-01-11 MED ORDER — OXYCODONE-ACETAMINOPHEN 5-325 MG PO TABS: 1.0000 | ORAL_TABLET | ORAL | 0 refills | Status: AC | PRN

## 2021-01-11 MED ORDER — TRIAMCINOLONE ACETONIDE 0.1 % EX CREA: TOPICAL_CREAM | Freq: Three times a day (TID) | CUTANEOUS | 0 refills | Status: AC

## 2021-01-11 MED ORDER — DOCUSATE SODIUM 100 MG PO CAPS: 100.0000 mg | ORAL_CAPSULE | Freq: Two times a day (BID) | ORAL | 0 refills | Status: AC

## 2021-01-11 MED ORDER — ASPIRIN 325 MG PO TABS: 325.0000 mg | ORAL_TABLET | Freq: Every day | ORAL | 1 refills | Status: AC

## 2021-01-11 MED ORDER — FERROUS SULFATE 325 (65 FE) MG PO TABS: 325.0000 mg | ORAL_TABLET | Freq: Two times a day (BID) | ORAL | 3 refills | Status: AC

## 2021-01-11 MED ORDER — MAGNESIUM CITRATE PO SOLN
1.0000 | Freq: Once | ORAL | Status: AC
  Administered 2021-01-11: 1 via ORAL
  Filled 2021-01-11: qty 296

## 2021-01-11 MED ORDER — HYDROGEN PEROXIDE 3 % EX SOLN: 1.0000 "application " | CUTANEOUS | 0 refills | Status: AC | PRN

## 2021-01-11 MED ORDER — ENSURE ENLIVE PO LIQD: 237.0000 mL | Freq: Two times a day (BID) | ORAL | 12 refills | Status: AC

## 2021-01-11 MED ORDER — ASPIRIN 325 MG PO TABS
325.0000 mg | ORAL_TABLET | Freq: Every day | ORAL | Status: DC
  Administered 2021-01-11: 325 mg via ORAL
  Filled 2021-01-11: qty 1

## 2021-01-11 NOTE — Plan of Care (Signed)

## 2021-01-11 NOTE — Progress Notes (Addendum)
Physical Therapy Treatment Patient Details Name: Brad Santiago MRN: [id number] DOB: 07-20-1941 Today's Date: 01/11/2021    History of Present Illness Brad Santiago is a 80 y.o. male with medical history significant for hypertension, non-insulin-dependent diabetes mellitus, hypothyroid, hyperlipidemia, peripheral neuropathy, presents to the emergency department for chief concerns of passing out. Patient states he was sitting down on his chair when he felt weak in the last thing he remembered was seeing the coffee table about to catch his chin as he fell forward. He woke up on the floor.  He states he does not remember anything else.  Patient sustained a bimalleolar L ankle fracture. S/P external fixation on 4/26. Now with new therapy orders s/p ex-fix removal and ORIF 5/6.    PT Comments    Pt to EOB with min a x 1 and increased time.  He is able to sit 25 minutes EOB while talking about family/pets but as he fatigues he does lean on knees for support.  Cues for exercises in sitting and to sit upright but easily distracted from task by stories after a few moments.  He declines transfer to recliner stating he hopes to discharge to SNF today.  He is able to lateral scoot left/right with min a x 1 in sitting.   Pt does demonstrate some confusion during session.  He does joke a lot so it is difficult to tell at times.  He asked why I was here so late being 9:00 and he doesn't like to work with therapy after 4:00.  Initially thought he was joking but on further questioning he thought it was 9:00 at night.  Orientated to time and had him look outside to bright sunshine.  "Oh it is, I get mixed up like that some times."  He may have more cognitive deficits than he initially appears and uses humor/joking to cover.     Follow Up Recommendations  SNF     Equipment Recommendations       Recommendations for Other Services       Precautions / Restrictions Precautions Precautions: Fall Restrictions Weight  Bearing Restrictions: Yes LLE Weight Bearing: Non weight bearing    Mobility  Bed Mobility Overal bed mobility: Needs Assistance Bed Mobility: Supine to Sit;Sit to Supine     Supine to sit: Min assist Sit to supine: Mod assist        Transfers                    Ambulation/Gait                 Stairs             Wheelchair Mobility    Modified Rankin (Stroke Patients Only)       Balance Overall balance assessment: Needs assistance Sitting-balance support: Feet supported Sitting balance-Leahy Scale: Fair Sitting balance - Comments: some UE support utilized as pt is somewhat drowsy/fatigued and less sturdy in static sitting       Standing balance comment: deferred                            Cognition Arousal/Alertness: Awake/alert Behavior During Therapy: WFL for tasks assessed/performed Overall Cognitive Status: Within Functional Limits for tasks assessed  Exercises Other Exercises Other Exercises: sitting EOB x 25 minutes    General Comments        Pertinent Vitals/Pain Pain Assessment: Faces Faces Pain Scale: Hurts even more Pain Location: L LE - at ankle with mobility Pain Descriptors / Indicators: Grimacing;Operative site guarding;Sore Pain Intervention(s): Limited activity within patient's tolerance;Monitored during session    Home Living                      Prior Function            PT Goals (current goals can now be found in the care plan section) Progress towards PT goals: Progressing toward goals    Frequency    7X/week      PT Plan Current plan remains appropriate    Co-evaluation              AM-PAC PT "6 Clicks" Mobility   Outcome Measure  Help needed turning from your back to your side while in a flat bed without using bedrails?: A Little Help needed moving from lying on your back to sitting on the side of a flat bed  without using bedrails?: A Little Help needed moving to and from a bed to a chair (including a wheelchair)?: A Lot Help needed standing up from a chair using your arms (e.g., wheelchair or bedside chair)?: A Lot Help needed to walk in hospital room?: Total Help needed climbing 3-5 steps with a railing? : Total 6 Click Score: 12    End of Session Equipment Utilized During Treatment: Gait belt Activity Tolerance: Patient limited by pain;Patient limited by fatigue Patient left: in bed;with call bell/phone within reach;with bed alarm set Nurse Communication: Mobility status PT Visit Diagnosis: Other abnormalities of gait and mobility (R26.89);Muscle weakness (generalized) (M62.81);History of falling (Z91.81);Pain Pain - Right/Left: Left Pain - part of body: Ankle and joints of foot     Time: 6378-5885 PT Time Calculation (min) (ACUTE ONLY): 32 min  Charges:  $Therapeutic Exercise: 8-22 mins $Therapeutic Activity: 8-22 mins                    Danielle Dess, PTA 01/11/21, 11:06 AM

## 2021-01-11 NOTE — TOC Transition Note (Signed)
Transition of Care [facility]) - CM/SW Discharge Note   Patient Details  Name: Brad Santiago MRN: [id number] Date of Birth: 1941-02-07  Transition of Care [facility]) CM/SW Contact:  Maree Krabbe, LCSW Phone Number: 01/11/2021, 12:31 PM   Clinical Narrative:   Clinical Social Worker facilitated patient discharge including contacting patient family and facility to confirm patient discharge plans.  Clinical information faxed to facility and family agreeable with plan.  CSW arranged ambulance transport via [facility] to [facility] (room [address]  RN to call [phone] for report prior to discharge.    Final next level of care: Skilled Nursing Facility Barriers to Discharge: No Barriers Identified   Patient Goals and CMS Choice        Discharge Placement              Patient chooses bed at:  [name]) Patient to be transferred to facility by: [facility]   Patient and family notified of of transfer: 01/11/21  Discharge Plan and Services                                     Social Determinants of Health (SDOH) Interventions     Readmission Risk Interventions No flowsheet data found.

## 2021-01-11 NOTE — Discharge Summary (Signed)
Physician Discharge Summary  Brad Santiago EPP:[id number] DOB: Jul 15, 1941 DOA: 12/27/2020  PCP: Dorothey Baseman, MD  Admit date: 12/27/2020 Discharge date: 01/11/2021  Admitted From: Home Disposition: SNF  Recommendations for Outpatient Follow-up:  1. Follow up with PCP in 1-2 weeks 2. Follow-up with orthopedic surgery 3. Please obtain BMP/CBC in one week 4. Please follow up on the following pending results: None  Home Health: No Equipment/Devices: Rolling walker Discharge Condition: Stable CODE STATUS: Full Diet recommendation: Heart Healthy / Carb Modified   Brief/Interim Summary: Patient admitted 12/27/2020 with syncope and hypoglycemic episode and found to have a left ankle fracture. On 12/29/2020 patient had external fixator to the left ankle by Dr. Okey Dupre. He wanted to wait 7 to 10 days for ORIF procedures secondary to blisters on the skin,which started improving and he underwent ORIF on 01/08/2021.  Tolerated the procedure well. Pain is well controlled on the current regimen.  Patient is being discharged to rehab for further management and will follow up with orthopedic surgery as an outpatient. Patient is discharged on full dose of aspirin as DVT prophylaxis, he will follow-up with orthopedic surgery for the duration and can resume his home dose of aspirin 81 mg once finished with full dose.  Patient had bilateral lower extremity cat scratches for which he was treated for 5 days of azithromycin.  No other concerns.  Patient also developed erythematous rash on his back with some pruritus, thought to be due to contact dermatitis and excessive sweating.  Improved with triamcinolone cream.  He can also use Atarax as needed.  Patient has well-controlled diabetes mellitus with A1c of 6.1.  He will continue his home regimen and follow-up with his primary care provider for further recommendations.  Patient developed anemia, anemia panel with deficiency of both iron and folate.  Patient  will continue with supplements.  Patient has an history of hypothyroidism and will continue home dose of Synthroid and follow-up with his primary care provider.  Discharge Diagnoses:  Active Problems:   Closed left ankle fracture   Type 2 diabetes mellitus with hyperlipidemia (HCC)   Essential hypertension   Hyperlipidemia   Hypothyroidism   Acute kidney injury superimposed on CKD (HCC)   Acute metabolic encephalopathy   Cat scratch   Hyperkalemia   Depression   Weakness   Rash   Anemia   Closed trimalleolar fracture of left ankle   Syncope and collapse   Status post open reduction with internal fixation (ORIF) of fracture of ankle   Hypoglycemia   Discharge Instructions  Discharge Instructions    Diet - low sodium heart healthy   Complete by: As directed    Discharge wound care:   Complete by: As directed    Cleanse with NS, pat gently dry. Cover with folded pieces of antimicrobial, nonadherent gauze (xeroform, Lawson # 294).  Top with dry gauze dressings and secure with Kerlix roll gauze/paper tape.  Place no tape on skin.  Change daily.   Increase activity slowly   Complete by: As directed      Allergies as of 01/11/2021      Reactions   Sulfa Antibiotics       Medication List    STOP taking these medications   amLODipine 2.5 MG tablet Commonly known as: NORVASC   aspirin EC 81 MG tablet Replaced by: aspirin 325 MG tablet   lisinopril 5 MG tablet Commonly known as: ZESTRIL     TAKE these medications   aspirin 325 MG tablet Take  1 tablet (325 mg total) by mouth daily. Start taking on: Jan 12, 2021 Replaces: aspirin EC 81 MG tablet   atorvastatin 80 MG tablet Commonly known as: LIPITOR Take 80 mg by mouth daily at 6 PM.   docusate sodium 100 MG capsule Commonly known as: COLACE Take 1 capsule (100 mg total) by mouth 2 (two) times daily.   escitalopram 20 MG tablet Commonly known as: LEXAPRO Take 20 mg by mouth daily.   feeding supplement  Liqd Take 237 mLs by mouth 2 (two) times daily between meals.   ferrous sulfate 325 (65 FE) MG tablet Take 1 tablet (325 mg total) by mouth 2 (two) times daily with a meal.   folic acid 1 MG tablet Commonly known as: FOLVITE Take 1 tablet (1 mg total) by mouth daily.   gabapentin 300 MG capsule Commonly known as: NEURONTIN Take 600 mg by mouth at bedtime.   glipiZIDE-metformin 5-500 MG tablet Commonly known as: METAGLIP Take 2 tablets by mouth 2 (two) times daily before a meal.   hydrogen peroxide 3 % external solution Apply 1 application topically as needed.   levothyroxine 25 MCG tablet Commonly known as: SYNTHROID Take 25 mcg by mouth daily before breakfast.   magnesium oxide 400 MG tablet Commonly known as: MAG-OX Take 400 mg by mouth daily.   meloxicam 15 MG tablet Commonly known as: MOBIC Take 1 tablet by mouth daily.   oxyCODONE-acetaminophen 5-325 MG tablet Commonly known as: PERCOCET/ROXICET Take 1 tablet by mouth every 4 (four) hours as needed for moderate pain.   oxymetazoline 0.05 % nasal spray Commonly known as: AFRIN Place 1 spray into both nostrils daily.   pioglitazone 30 MG tablet Commonly known as: ACTOS Take 30 mg by mouth daily.   polyethylene glycol 17 g packet Commonly known as: MIRALAX / GLYCOLAX Take 17 g by mouth daily as needed.   triamcinolone cream 0.1 % Commonly known as: KENALOG Apply topically 3 (three) times daily.            Discharge Care Instructions  (From admission, onward)         Start     Ordered   01/11/21 0000  Discharge wound care:       Comments: Cleanse with NS, pat gently dry. Cover with folded pieces of antimicrobial, nonadherent gauze (xeroform, Lawson # 294).  Top with dry gauze dressings and secure with Kerlix roll gauze/paper tape.  Place no tape on skin.  Change daily.   01/11/21 1237          Contact information for follow-up providers    Dorothey Baseman, MD. Schedule an appointment as soon  as possible for a visit.   Specialty: Family Medicine Contact information: [address] [place] [address] [phone]        Ross Marcus, MD. Schedule an appointment as soon as possible for a visit in 1 week(s).   Specialty: Orthopedic Surgery Contact information: [address] [place] [address] [phone]            Contact information for after-discharge care    Destination    [facility] .   Service: Skilled Nursing Contact information: [address] [place] [address] [phone]                 Allergies  Allergen Reactions  . Sulfa Antibiotics     Consultations:  Orthopedic surgery.  Procedures/Studies: DG Ankle 2 Views Left  Result Date: 01/08/2021  CLINICAL DATA:  Surgery. EXAM: DG C-ARM 1-60 MIN; LEFT ANKLE - 2 VIEW FLUOROSCOPY TIME:  Fluoroscopy Time:  55 seconds. Number of Acquired Spot Images: 3 COMPARISON:  Jan 07, 2021. FINDINGS: Three C-arm fluoroscopic images were obtained intraoperatively and submitted for post operative interpretation. These images demonstrate plate and screw fixation of the distal fibula and plate and screw fixation of the medial malleolus with additional single screw through the medial malleolar fracture. Improved alignment, near anatomic. Please see the performing provider's procedural report for further detail. IMPRESSION: Intraoperative fluoroscopy, as detailed above. Electronically Signed   By: Feliberto HartsFrederick S Jones MD   On: 01/08/2021 11:36   DG Ankle 2 Views Left  Result Date: 01/07/2021 CLINICAL DATA:  Preoperative evaluation. EXAM: LEFT ANKLE - 2 VIEW COMPARISON:  12/29/2020. FINDINGS: External fixation noted with hardware noted in the distal tibia and calcaneus. Medial malleolar and distal fibular fractures again noted. IMPRESSION: External fixation left ankle. Electronically Signed   By: Maisie Fushomas  Register   On: 01/07/2021 08:36   DG Ankle 2 Views  Left  Result Date: 12/29/2020 CLINICAL DATA:  Left ankle external fixation. EXAM: LEFT ANKLE - 2 VIEW; DG C-ARM 1-60 MIN COMPARISON:  Radiograph 12/27/2020 FINDINGS: Four fluoroscopic spot views obtained in the operating room during fixator placement. Pins in the tibia and calcaneus. Improved alignment of distal tibia and fibular fractures. Fluoroscopy time and dose not provided by technologist. IMPRESSION: Intraoperative fluoroscopy during external fixator placement. Electronically Signed   By: Narda RutherfordMelanie  Sanford M.D.   On: 12/29/2020 15:08   DG Ankle 2 Views Left  Result Date: 12/27/2020 CLINICAL DATA:  Postreduction left ankle fractures. EXAM: LEFT ANKLE - 2 VIEW COMPARISON:  12/27/2020 FINDINGS: Interval placement of cast material which obscures some bone detail. Comminuted fractures are demonstrated in the medial, lateral, and posterior malleolus. Improved alignment of the fracture fragments postreduction but there is still about 6 mm lateral displacement of the talus and medial malleolar fragment with respect to the tibia, resulting in a gap in the articular surface. Degenerative changes in the intertarsal joints. IMPRESSION: Comminuted fractures of the medial, lateral, and posterior malleolus with improved alignment postreduction but persistent lateral displacement of the talus and medial malleolar fragment with respect to the tibia. Electronically Signed   By: Burman NievesWilliam  Stevens M.D.   On: 12/27/2020 20:05   DG Ankle Complete Left  Result Date: 12/27/2020 CLINICAL DATA:  Ankle pain and swelling after fall EXAM: LEFT ANKLE COMPLETE - 3+ VIEW COMPARISON:  None. FINDINGS: Displaced fracture of the medial malleolus with apparent preservation of the medial clear space. Minimally displaced fracture of the posterior tibial plafond. Displaced fracture of the distal fibula with apparent preservation of the lateral clear space. Diffuse soft tissue swelling about the ankle. IMPRESSION: Displaced trimalleolar  fracture with diffuse soft tissue swelling about the ankle. Electronically Signed   By: Maudry MayhewJeffrey  Waltz MD   On: 12/27/2020 18:30   CT Head Wo Contrast  Result Date: 12/27/2020 CLINICAL DATA:  Fall EXAM: CT HEAD WITHOUT CONTRAST CT MAXILLOFACIAL WITHOUT CONTRAST TECHNIQUE: Multidetector CT imaging of the head and maxillofacial structures were performed using the standard protocol without intravenous contrast. Multiplanar CT image reconstructions of the maxillofacial structures were also generated. COMPARISON:  None. FINDINGS: CT HEAD FINDINGS Brain: There is no mass, hemorrhage or extra-axial collection. There is generalized atrophy without lobar predilection. There is hypoattenuation of the periventricular white matter, most commonly indicating chronic ischemic microangiopathy. Vascular: No hyperdense vessel or unexpected vascular calcification. Skull: The visualized skull  base, calvarium and extracranial soft tissues are normal. CT MAXILLOFACIAL FINDINGS Osseous: --Complex facial fracture types: No LeFort, zygomaticomaxillary complex or nasoorbitoethmoidal fracture. --Simple fracture types: None. --Mandible, hard palate and teeth: No acute abnormality. Two areas of cystic change in the maxilla at the roots of absent incisors. Orbits: The globes and optic nerves are intact. Normal extraocular muscles and intraorbital fat. Sinuses: No acute finding. Soft tissues: Normal visualized extracranial soft tissues. IMPRESSION: 1. Chronic ischemic microangiopathy and generalized atrophy without acute intracranial abnormality. 2. No facial fracture. Electronically Signed   By: Deatra Robinson M.D.   On: 12/27/2020 19:13   DG C-Arm 1-60 Min  Result Date: 01/08/2021 CLINICAL DATA:  Surgery. EXAM: DG C-ARM 1-60 MIN; LEFT ANKLE - 2 VIEW FLUOROSCOPY TIME:  Fluoroscopy Time:  55 seconds. Number of Acquired Spot Images: 3 COMPARISON:  Jan 07, 2021. FINDINGS: Three C-arm fluoroscopic images were obtained intraoperatively and  submitted for post operative interpretation. These images demonstrate plate and screw fixation of the distal fibula and plate and screw fixation of the medial malleolus with additional single screw through the medial malleolar fracture. Improved alignment, near anatomic. Please see the performing provider's procedural report for further detail. IMPRESSION: Intraoperative fluoroscopy, as detailed above. Electronically Signed   By: Feliberto Harts MD   On: 01/08/2021 11:36   DG C-Arm 1-60 Min  Result Date: 01/08/2021 CLINICAL DATA:  Surgery. EXAM: DG C-ARM 1-60 MIN; LEFT ANKLE - 2 VIEW FLUOROSCOPY TIME:  Fluoroscopy Time:  55 seconds. Number of Acquired Spot Images: 3 COMPARISON:  Jan 07, 2021. FINDINGS: Three C-arm fluoroscopic images were obtained intraoperatively and submitted for post operative interpretation. These images demonstrate plate and screw fixation of the distal fibula and plate and screw fixation of the medial malleolus with additional single screw through the medial malleolar fracture. Improved alignment, near anatomic. Please see the performing provider's procedural report for further detail. IMPRESSION: Intraoperative fluoroscopy, as detailed above. Electronically Signed   By: Feliberto Harts MD   On: 01/08/2021 11:36   DG C-Arm 1-60 Min  Result Date: 12/29/2020 CLINICAL DATA:  Left ankle external fixation. EXAM: LEFT ANKLE - 2 VIEW; DG C-ARM 1-60 MIN COMPARISON:  Radiograph 12/27/2020 FINDINGS: Four fluoroscopic spot views obtained in the operating room during fixator placement. Pins in the tibia and calcaneus. Improved alignment of distal tibia and fibular fractures. Fluoroscopy time and dose not provided by technologist. IMPRESSION: Intraoperative fluoroscopy during external fixator placement. Electronically Signed   By: Narda Rutherford M.D.   On: 12/29/2020 15:08   DG C-Arm 1-60 Min  Result Date: 12/29/2020 CLINICAL DATA:  Left ankle external fixation. EXAM: LEFT ANKLE - 2 VIEW;  DG C-ARM 1-60 MIN COMPARISON:  Radiograph 12/27/2020 FINDINGS: Four fluoroscopic spot views obtained in the operating room during fixator placement. Pins in the tibia and calcaneus. Improved alignment of distal tibia and fibular fractures. Fluoroscopy time and dose not provided by technologist. IMPRESSION: Intraoperative fluoroscopy during external fixator placement. Electronically Signed   By: Narda Rutherford M.D.   On: 12/29/2020 15:08   [place] OR NERVE BLOCK-IMAGE ONLY [facility])  Result Date: 12/29/2020 There is no interpretation for this exam.  This order is for images obtained during a surgical procedure.  Please See "Surgeries" Tab for more information regarding the procedure.   CT Maxillofacial Wo Contrast  Result Date: 12/27/2020 CLINICAL DATA:  Fall EXAM: CT HEAD WITHOUT CONTRAST CT MAXILLOFACIAL WITHOUT CONTRAST TECHNIQUE: Multidetector CT imaging of the head and maxillofacial structures were performed using the standard protocol  without intravenous contrast. Multiplanar CT image reconstructions of the maxillofacial structures were also generated. COMPARISON:  None. FINDINGS: CT HEAD FINDINGS Brain: There is no mass, hemorrhage or extra-axial collection. There is generalized atrophy without lobar predilection. There is hypoattenuation of the periventricular white matter, most commonly indicating chronic ischemic microangiopathy. Vascular: No hyperdense vessel or unexpected vascular calcification. Skull: The visualized skull base, calvarium and extracranial soft tissues are normal. CT MAXILLOFACIAL FINDINGS Osseous: --Complex facial fracture types: No LeFort, zygomaticomaxillary complex or nasoorbitoethmoidal fracture. --Simple fracture types: None. --Mandible, hard palate and teeth: No acute abnormality. Two areas of cystic change in the maxilla at the roots of absent incisors. Orbits: The globes and optic nerves are intact. Normal extraocular muscles and intraorbital fat. Sinuses: No acute finding. Soft  tissues: Normal visualized extracranial soft tissues. IMPRESSION: 1. Chronic ischemic microangiopathy and generalized atrophy without acute intracranial abnormality. 2. No facial fracture. Electronically Signed   By: Deatra Robinson M.D.   On: 12/27/2020 19:13    Subjective: Patient was seen and examined today.  No new complaint.  He is ready to go to SNF for further rehab.  He was constipated for the last few days which resolved with magnesium citrate.  Discharge Exam: Vitals:   01/11/21 0522 01/11/21 0813  BP: (!) 106/52 (!) 119/58  Pulse: 64 70  Resp: 20 18  Temp: 98.7 F (37.1 C) 98.2 F (36.8 C)  SpO2: 96% 95%   Vitals:   01/10/21 1630 01/10/21 2013 01/11/21 0522 01/11/21 0813  BP: (!) 104/50 (!) 113/56 (!) 106/52 (!) 119/58  Pulse: 61 63 64 70  Resp: Temp:  98.5 F (36.9 C) 98.7 F (37.1 C) 98.2 F (36.8 C)  TempSrc:      SpO2: 96% 96% 96% 95%  Weight:      Height:        General: Pt is alert, awake, not in acute distress Cardiovascular: RRR, S1/S2 +, no rubs, no gallops Respiratory: CTA bilaterally, no wheezing, no rhonchi Abdominal: Soft, NT, ND, bowel sounds + Extremities: no edema, no cyanosis, left leg and foot with Ace wrap.   The results of significant diagnostics from this hospitalization (including imaging, microbiology, ancillary and laboratory) are listed below for reference.    Microbiology: No results found for this or any previous visit (from the past 240 hour(s)).   Labs: BNP (last 3 results) No results for input(s): BNP in the last 8760 hours. Basic Metabolic Panel: Recent Labs  Lab 01/08/21 0442 01/09/21 0454 01/10/21 0459 01/11/21 0425  NA 136 131* 135 136  K 3.9 4.0 3.8 4.3  CL 102 99 104 102  CO2 GLUCOSE 131* 164* 130* 123*  BUN 26* 24* 20 21  CREATININE 1.14 1.23 1.21 1.19  CALCIUM 8.4* 7.9* 8.1* 8.4*  MG  --  1.7 1.8 1.8   Liver Function Tests: No results for input(s): AST, ALT, ALKPHOS, BILITOT,  PROT, ALBUMIN in the last 168 hours. No results for input(s): LIPASE, AMYLASE in the last 168 hours. No results for input(s): AMMONIA in the last 168 hours. CBC: Recent Labs  Lab 01/08/21 0442 01/09/21 0454 01/10/21 0459 01/11/21 0425  WBC 9.5 11.6* 11.3* 9.9  NEUTROABS 5.8  --   --   --   HGB 9.9* 8.2* 8.3* 8.4*  HCT 30.3* 25.0* 25.3* 26.5*  MCV 97.1 98.0 96.2 99.3  PLT 398 337 335 363   Cardiac Enzymes: No results for input(s): CKTOTAL, CKMB, CKMBINDEX,  TROPONINI in the last 168 hours. BNP: Invalid input(s): POCBNP CBG: Recent Labs  Lab 01/10/21 1155 01/10/21 1628 01/10/21 2206 01/11/21 0737 01/11/21 1205  GLUCAP 181* 172* 191* 121* 161*   D-Dimer No results for input(s): DDIMER in the last 72 hours. Hgb A1c No results for input(s): HGBA1C in the last 72 hours. Lipid Profile No results for input(s): CHOL, HDL, LDLCALC, TRIG, CHOLHDL, LDLDIRECT in the last 72 hours. Thyroid function studies No results for input(s): TSH, T4TOTAL, T3FREE, THYROIDAB in the last 72 hours.  Invalid input(s): FREET3 Anemia work up Recent Labs    01/09/21 0454  VITAMINB12 993*  FOLATE 5.7*  TIBC 280  IRON 25*   Urinalysis    Component Value Date/Time   COLORURINE Yellow 06/14/2012 1228   APPEARANCEUR Cloudy 06/14/2012 1228   LABSPEC 1.017 06/14/2012 1228   PHURINE 6.0 06/14/2012 1228   GLUCOSEU 150 mg/dL 16/06/9603 5409   HGBUR 3+ 06/14/2012 1228   BILIRUBINUR Negative 06/14/2012 1228   KETONESUR Trace 06/14/2012 1228   PROTEINUR 100 mg/dL 81/19/1478 2956   NITRITE Negative 06/14/2012 1228   LEUKOCYTESUR 3+ 06/14/2012 1228   Sepsis Labs Invalid input(s): PROCALCITONIN,  WBC,  LACTICIDVEN Microbiology No results found for this or any previous visit (from the past 240 hour(s)).  Time coordinating discharge: Over 30 minutes  SIGNED:  Arnetha Courser, MD  [facility] 01/11/2021, 12:39 PM  If 7PM-7AM, please contact night-coverage [email or web address]  This record has  been created using Conservation officer, historic buildings. Errors have been sought and corrected,but may not always be located. Such creation errors do not reflect on the standard of care.

## 2021-01-11 NOTE — Progress Notes (Signed)
Nutrition Follow-up  DOCUMENTATION CODES:  Obesity unspecified  INTERVENTION:   Continue current diet as ordered  Ensure Enlive po BID, each supplement provides 350 kcal and 20 grams of protein  NUTRITION DIAGNOSIS:  Increased nutrient needs related to post-op healing as evidenced by estimated needs.  GOAL:  Patient will meet greater than or equal to 90% of their needs  MONITOR:  PO intake,Supplement acceptance  REASON FOR ASSESSMENT:  Consult Hip fracture protocol  ASSESSMENT:  Pt presented to ED after passing out at home. Found to be hypoglycemic in ED and imaging suggestive of an ankle fracture. At baseline, lives at home with his 2 cats and uses a walker to ambulate. PMH includes HTN, DM type 2, hypothyroid, HLD.  Pt being assisted with personal care at the time of assessment. Insurance authorization was received and pt will dc today to receive rehab prior to going home. Noted an increase in oral intake since last assessment. Pt consuming 1 ensure each day per RN documentation. Would recommend continuing at SNF to continue to encourage wound healing and adequate nutrition.     4/26 - Op, Left ankle spanning external fixator 4/27 - Op, ORIF of the ankle, removal of external fixation  Average Meal Intake: . 4/26-5/1: 58% intake x 13 recorded meals . 5/2-5/9: 81% intake x 14 recorded meals  Relevant Scheduled Meds: . atorvastatin  80 mg Oral QHS  . docusate sodium  100 mg Oral BID  . ferrous sulfate  325 mg Oral BID WC  . folic acid  1 mg Oral Daily  . insulin aspart  0-5 Units Subcutaneous QHS  . insulin aspart  0-9 Units Subcutaneous TID WC  . magnesium citrate  1 Bottle Oral Once   Relevant PRN Meds: ondansetron, polyethylene glycol  Labs reviewed:  BUN 26  SBG ranges from 121-191 mg/dL over the last 24 hours  Last HgbA1c 6.1 (4/24)  NUTRITION - FOCUSED PHYSICAL EXAM: Flowsheet Row Most Recent Value  Orbital Region No depletion  Upper Arm Region No  depletion  Thoracic and Lumbar Region No depletion  Buccal Region No depletion  Temple Region No depletion  Clavicle Bone Region No depletion  Clavicle and Acromion Bone Region No depletion  Scapular Bone Region No depletion  Dorsal Hand Mild depletion  Patellar Region No depletion  Anterior Thigh Region No depletion  Posterior Calf Region No depletion  Edema (RD Assessment) Mild  Hair Reviewed  Eyes Reviewed  Mouth Reviewed  Skin Reviewed  Nails Reviewed     Diet Order:   Diet Order            Diet - low sodium heart healthy           Diet Carb Modified Fluid consistency: Thin; Room service appropriate? Yes  Diet effective now                EDUCATION NEEDS:  No education needs have been identified at this time  Skin:  Skin Assessment: Skin Integrity Issues: Skin Integrity Issues:: Incisions,Other (Comment) Incisions: ankle Other: scratches to the right and left leg  Last BM:  5/9, type 2, per RN report  Height:  Ht Readings from Last 1 Encounters:  12/27/20 6' (1.829 m)    Weight:  Wt Readings from Last 1 Encounters:  01/07/21 101 kg    Ideal Body Weight:  80.9 kg  BMI:  Body mass index is 30.2 kg/m.  Estimated Nutritional Needs:   Kcal:  1900-2100 kcal/d  Protein:  95-105  g  Fluid:  >2L/d   Greig Castilla, RD, LDN Clinical Dietitian Pager on Amion

## 2021-01-11 NOTE — Progress Notes (Signed)
Pt discharged to [facility], report called to [facility]. All instructions provided with questions answered.   BP 108/61 (BP Location: Left Arm)   Pulse 64   Temp 97.8 F (36.6 C)   Resp 18   Ht 6' (1.829 m)   Wt 101 kg   SpO2 96%   BMI 30.20 kg/m

## 2021-01-11 NOTE — Plan of Care (Signed)
  Problem: Education: Goal: Knowledge of General Education information will improve Description: Including pain rating scale, medication(s)/side effects and non-pharmacologic comfort measures 01/11/2021 1432 by Jules Schick, RN Outcome: Completed/Met 01/11/2021 0936 by Jules Schick, RN Outcome: Progressing   Problem: Health Behavior/Discharge Planning: Goal: Ability to manage health-related needs will improve 01/11/2021 1432 by Jules Schick, RN Outcome: Completed/Met 01/11/2021 0936 by Jules Schick, RN Outcome: Progressing   Problem: Clinical Measurements: Goal: Ability to maintain clinical measurements within normal limits will improve 01/11/2021 1432 by Jules Schick, RN Outcome: Completed/Met 01/11/2021 0936 by Jules Schick, RN Outcome: Progressing Goal: Will remain free from infection 01/11/2021 1432 by Jules Schick, RN Outcome: Completed/Met 01/11/2021 0936 by Jules Schick, RN Outcome: Progressing Goal: Diagnostic test results will improve 01/11/2021 1432 by Jules Schick, RN Outcome: Completed/Met 01/11/2021 0936 by Jules Schick, RN Outcome: Progressing Goal: Respiratory complications will improve 01/11/2021 1432 by Jules Schick, RN Outcome: Completed/Met 01/11/2021 0936 by Jules Schick, RN Outcome: Progressing Goal: Cardiovascular complication will be avoided 01/11/2021 1432 by Jules Schick, RN Outcome: Completed/Met 01/11/2021 0936 by Jules Schick, RN Outcome: Progressing   Problem: Activity: Goal: Risk for activity intolerance will decrease 01/11/2021 1432 by Jules Schick, RN Outcome: Completed/Met 01/11/2021 0936 by Jules Schick, RN Outcome: Progressing   Problem: Nutrition: Goal: Adequate nutrition will be maintained 01/11/2021 1432 by Jules Schick, RN Outcome: Completed/Met 01/11/2021 0936 by Jules Schick, RN Outcome: Progressing   Problem: Coping: Goal: Level of anxiety will decrease 01/11/2021  1432 by Jules Schick, RN Outcome: Completed/Met 01/11/2021 0936 by Jules Schick, RN Outcome: Progressing   Problem: Elimination: Goal: Will not experience complications related to bowel motility 01/11/2021 1432 by Jules Schick, RN Outcome: Completed/Met 01/11/2021 0936 by Jules Schick, RN Outcome: Progressing Goal: Will not experience complications related to urinary retention 01/11/2021 1432 by Jules Schick, RN Outcome: Completed/Met 01/11/2021 0936 by Jules Schick, RN Outcome: Progressing   Problem: Pain Managment: Goal: General experience of comfort will improve 01/11/2021 1432 by Jules Schick, RN Outcome: Completed/Met 01/11/2021 0936 by Jules Schick, RN Outcome: Progressing   Problem: Safety: Goal: Ability to remain free from injury will improve 01/11/2021 1432 by Jules Schick, RN Outcome: Completed/Met 01/11/2021 0936 by Jules Schick, RN Outcome: Progressing   Problem: Skin Integrity: Goal: Risk for impaired skin integrity will decrease 01/11/2021 1432 by Jules Schick, RN Outcome: Completed/Met 01/11/2021 0936 by Jules Schick, RN Outcome: Progressing

## 2021-01-11 NOTE — Plan of Care (Signed)
  Problem: Clinical Measurements: Goal: Will remain free from infection Outcome: Progressing   Problem: Activity: Goal: Risk for activity intolerance will decrease Outcome: Progressing   Problem: Nutrition: Goal: Adequate nutrition will be maintained Outcome: Progressing   Problem: Coping: Goal: Level of anxiety will decrease Outcome: Progressing   Problem: Elimination: Goal: Will not experience complications related to bowel motility Outcome: Progressing   Problem: Pain Managment: Goal: General experience of comfort will improve Outcome: Progressing   Problem: Safety: Goal: Ability to remain free from injury will improve Outcome: Progressing   Problem: Skin Integrity: Goal: Risk for impaired skin integrity will decrease Outcome: Progressing   

## 2021-01-11 NOTE — TOC Progression Note (Signed)
Transition of Care [facility]) - Progression Note    Patient Details  Name: Brad Santiago MRN: [id number] Date of Birth: 1941/08/12  [facility] of [facility]) CM/SW Contact  Maree Krabbe, LCSW Phone Number: 01/11/2021, 11:30 AM  Clinical Narrative:   Berkley Harvey obtained.         Expected Discharge Plan and Services                                                 Social Determinants of Health (SDOH) Interventions    Readmission Risk Interventions No flowsheet data found.

## 2021-01-12 DIAGNOSIS — E1159 Type 2 diabetes mellitus with other circulatory complications: Secondary | ICD-10-CM | POA: Diagnosis not present

## 2021-01-12 DIAGNOSIS — R262 Difficulty in walking, not elsewhere classified: Secondary | ICD-10-CM | POA: Diagnosis not present

## 2021-01-12 DIAGNOSIS — S82853A Displaced trimalleolar fracture of unspecified lower leg, initial encounter for closed fracture: Secondary | ICD-10-CM | POA: Diagnosis not present

## 2021-01-12 DIAGNOSIS — I152 Hypertension secondary to endocrine disorders: Secondary | ICD-10-CM | POA: Diagnosis not present

## 2021-01-14 DIAGNOSIS — E119 Type 2 diabetes mellitus without complications: Secondary | ICD-10-CM | POA: Diagnosis not present

## 2021-01-14 DIAGNOSIS — S82892D Other fracture of left lower leg, subsequent encounter for closed fracture with routine healing: Secondary | ICD-10-CM | POA: Diagnosis not present

## 2021-01-14 DIAGNOSIS — I10 Essential (primary) hypertension: Secondary | ICD-10-CM | POA: Diagnosis not present

## 2021-01-14 DIAGNOSIS — E782 Mixed hyperlipidemia: Secondary | ICD-10-CM | POA: Diagnosis not present

## 2021-01-14 DIAGNOSIS — E039 Hypothyroidism, unspecified: Secondary | ICD-10-CM | POA: Diagnosis not present

## 2021-01-14 DIAGNOSIS — I152 Hypertension secondary to endocrine disorders: Secondary | ICD-10-CM | POA: Diagnosis not present

## 2021-01-14 DIAGNOSIS — E1159 Type 2 diabetes mellitus with other circulatory complications: Secondary | ICD-10-CM | POA: Diagnosis not present

## 2021-01-14 DIAGNOSIS — M6281 Muscle weakness (generalized): Secondary | ICD-10-CM | POA: Diagnosis not present

## 2021-01-14 DIAGNOSIS — D649 Anemia, unspecified: Secondary | ICD-10-CM | POA: Diagnosis not present

## 2021-01-14 DIAGNOSIS — R2681 Unsteadiness on feet: Secondary | ICD-10-CM | POA: Diagnosis not present

## 2021-01-19 DIAGNOSIS — M6281 Muscle weakness (generalized): Secondary | ICD-10-CM | POA: Diagnosis not present

## 2021-01-19 DIAGNOSIS — R262 Difficulty in walking, not elsewhere classified: Secondary | ICD-10-CM | POA: Diagnosis not present

## 2021-01-19 DIAGNOSIS — E119 Type 2 diabetes mellitus without complications: Secondary | ICD-10-CM | POA: Diagnosis not present

## 2021-01-19 DIAGNOSIS — E785 Hyperlipidemia, unspecified: Secondary | ICD-10-CM | POA: Diagnosis not present

## 2021-01-19 DIAGNOSIS — S82892D Other fracture of left lower leg, subsequent encounter for closed fracture with routine healing: Secondary | ICD-10-CM | POA: Diagnosis not present

## 2021-01-19 DIAGNOSIS — E782 Mixed hyperlipidemia: Secondary | ICD-10-CM | POA: Diagnosis not present

## 2021-01-19 DIAGNOSIS — I10 Essential (primary) hypertension: Secondary | ICD-10-CM | POA: Diagnosis not present

## 2021-01-19 DIAGNOSIS — S82853A Displaced trimalleolar fracture of unspecified lower leg, initial encounter for closed fracture: Secondary | ICD-10-CM | POA: Diagnosis not present

## 2021-01-19 DIAGNOSIS — E039 Hypothyroidism, unspecified: Secondary | ICD-10-CM | POA: Diagnosis not present

## 2021-01-19 DIAGNOSIS — R2681 Unsteadiness on feet: Secondary | ICD-10-CM | POA: Diagnosis not present

## 2021-01-19 DIAGNOSIS — D649 Anemia, unspecified: Secondary | ICD-10-CM | POA: Diagnosis not present

## 2021-01-20 DIAGNOSIS — N179 Acute kidney failure, unspecified: Secondary | ICD-10-CM | POA: Diagnosis not present

## 2021-01-20 DIAGNOSIS — E1159 Type 2 diabetes mellitus with other circulatory complications: Secondary | ICD-10-CM | POA: Diagnosis not present

## 2021-01-20 DIAGNOSIS — I152 Hypertension secondary to endocrine disorders: Secondary | ICD-10-CM | POA: Diagnosis not present

## 2021-01-21 DIAGNOSIS — I10 Essential (primary) hypertension: Secondary | ICD-10-CM | POA: Diagnosis not present

## 2021-01-21 DIAGNOSIS — R2681 Unsteadiness on feet: Secondary | ICD-10-CM | POA: Diagnosis not present

## 2021-01-21 DIAGNOSIS — S82892D Other fracture of left lower leg, subsequent encounter for closed fracture with routine healing: Secondary | ICD-10-CM | POA: Diagnosis not present

## 2021-01-21 DIAGNOSIS — E039 Hypothyroidism, unspecified: Secondary | ICD-10-CM | POA: Diagnosis not present

## 2021-01-21 DIAGNOSIS — E119 Type 2 diabetes mellitus without complications: Secondary | ICD-10-CM | POA: Diagnosis not present

## 2021-01-21 DIAGNOSIS — M6281 Muscle weakness (generalized): Secondary | ICD-10-CM | POA: Diagnosis not present

## 2021-01-21 DIAGNOSIS — E782 Mixed hyperlipidemia: Secondary | ICD-10-CM | POA: Diagnosis not present

## 2021-01-21 DIAGNOSIS — D649 Anemia, unspecified: Secondary | ICD-10-CM | POA: Diagnosis not present

## 2021-01-25 DIAGNOSIS — S82842D Displaced bimalleolar fracture of left lower leg, subsequent encounter for closed fracture with routine healing: Secondary | ICD-10-CM | POA: Diagnosis not present

## 2021-01-26 DIAGNOSIS — S82892D Other fracture of left lower leg, subsequent encounter for closed fracture with routine healing: Secondary | ICD-10-CM | POA: Diagnosis not present

## 2021-01-26 DIAGNOSIS — M6281 Muscle weakness (generalized): Secondary | ICD-10-CM | POA: Diagnosis not present

## 2021-01-26 DIAGNOSIS — E782 Mixed hyperlipidemia: Secondary | ICD-10-CM | POA: Diagnosis not present

## 2021-01-26 DIAGNOSIS — R2681 Unsteadiness on feet: Secondary | ICD-10-CM | POA: Diagnosis not present

## 2021-01-26 DIAGNOSIS — I10 Essential (primary) hypertension: Secondary | ICD-10-CM | POA: Diagnosis not present

## 2021-01-26 DIAGNOSIS — E119 Type 2 diabetes mellitus without complications: Secondary | ICD-10-CM | POA: Diagnosis not present

## 2021-01-26 DIAGNOSIS — E039 Hypothyroidism, unspecified: Secondary | ICD-10-CM | POA: Diagnosis not present

## 2021-01-26 DIAGNOSIS — D649 Anemia, unspecified: Secondary | ICD-10-CM | POA: Diagnosis not present

## 2021-02-02 DIAGNOSIS — E119 Type 2 diabetes mellitus without complications: Secondary | ICD-10-CM | POA: Diagnosis not present

## 2021-02-02 DIAGNOSIS — M6281 Muscle weakness (generalized): Secondary | ICD-10-CM | POA: Diagnosis not present

## 2021-02-02 DIAGNOSIS — I10 Essential (primary) hypertension: Secondary | ICD-10-CM | POA: Diagnosis not present

## 2021-02-02 DIAGNOSIS — S82892D Other fracture of left lower leg, subsequent encounter for closed fracture with routine healing: Secondary | ICD-10-CM | POA: Diagnosis not present

## 2021-02-02 DIAGNOSIS — R2681 Unsteadiness on feet: Secondary | ICD-10-CM | POA: Diagnosis not present

## 2021-02-02 DIAGNOSIS — E039 Hypothyroidism, unspecified: Secondary | ICD-10-CM | POA: Diagnosis not present

## 2021-02-02 DIAGNOSIS — E782 Mixed hyperlipidemia: Secondary | ICD-10-CM | POA: Diagnosis not present

## 2021-02-02 DIAGNOSIS — D649 Anemia, unspecified: Secondary | ICD-10-CM | POA: Diagnosis not present

## 2021-02-09 DIAGNOSIS — E782 Mixed hyperlipidemia: Secondary | ICD-10-CM | POA: Diagnosis not present

## 2021-02-09 DIAGNOSIS — E119 Type 2 diabetes mellitus without complications: Secondary | ICD-10-CM | POA: Diagnosis not present

## 2021-02-09 DIAGNOSIS — I10 Essential (primary) hypertension: Secondary | ICD-10-CM | POA: Diagnosis not present

## 2021-02-09 DIAGNOSIS — M6281 Muscle weakness (generalized): Secondary | ICD-10-CM | POA: Diagnosis not present

## 2021-02-09 DIAGNOSIS — S82892D Other fracture of left lower leg, subsequent encounter for closed fracture with routine healing: Secondary | ICD-10-CM | POA: Diagnosis not present

## 2021-02-09 DIAGNOSIS — D649 Anemia, unspecified: Secondary | ICD-10-CM | POA: Diagnosis not present

## 2021-02-09 DIAGNOSIS — E039 Hypothyroidism, unspecified: Secondary | ICD-10-CM | POA: Diagnosis not present

## 2021-02-09 DIAGNOSIS — R2681 Unsteadiness on feet: Secondary | ICD-10-CM | POA: Diagnosis not present

## 2021-02-11 DIAGNOSIS — M6281 Muscle weakness (generalized): Secondary | ICD-10-CM | POA: Diagnosis not present

## 2021-02-11 DIAGNOSIS — E782 Mixed hyperlipidemia: Secondary | ICD-10-CM | POA: Diagnosis not present

## 2021-02-11 DIAGNOSIS — E039 Hypothyroidism, unspecified: Secondary | ICD-10-CM | POA: Diagnosis not present

## 2021-02-11 DIAGNOSIS — R2681 Unsteadiness on feet: Secondary | ICD-10-CM | POA: Diagnosis not present

## 2021-02-11 DIAGNOSIS — E119 Type 2 diabetes mellitus without complications: Secondary | ICD-10-CM | POA: Diagnosis not present

## 2021-02-11 DIAGNOSIS — I10 Essential (primary) hypertension: Secondary | ICD-10-CM | POA: Diagnosis not present

## 2021-02-11 DIAGNOSIS — S82892D Other fracture of left lower leg, subsequent encounter for closed fracture with routine healing: Secondary | ICD-10-CM | POA: Diagnosis not present

## 2021-02-11 DIAGNOSIS — D649 Anemia, unspecified: Secondary | ICD-10-CM | POA: Diagnosis not present

## 2021-02-16 DIAGNOSIS — E039 Hypothyroidism, unspecified: Secondary | ICD-10-CM | POA: Diagnosis not present

## 2021-02-16 DIAGNOSIS — I10 Essential (primary) hypertension: Secondary | ICD-10-CM | POA: Diagnosis not present

## 2021-02-16 DIAGNOSIS — M6281 Muscle weakness (generalized): Secondary | ICD-10-CM | POA: Diagnosis not present

## 2021-02-16 DIAGNOSIS — R2681 Unsteadiness on feet: Secondary | ICD-10-CM | POA: Diagnosis not present

## 2021-02-16 DIAGNOSIS — E119 Type 2 diabetes mellitus without complications: Secondary | ICD-10-CM | POA: Diagnosis not present

## 2021-02-16 DIAGNOSIS — D649 Anemia, unspecified: Secondary | ICD-10-CM | POA: Diagnosis not present

## 2021-02-16 DIAGNOSIS — S82892D Other fracture of left lower leg, subsequent encounter for closed fracture with routine healing: Secondary | ICD-10-CM | POA: Diagnosis not present

## 2021-02-16 DIAGNOSIS — E782 Mixed hyperlipidemia: Secondary | ICD-10-CM | POA: Diagnosis not present

## 2021-02-23 DIAGNOSIS — E119 Type 2 diabetes mellitus without complications: Secondary | ICD-10-CM | POA: Diagnosis not present

## 2021-02-23 DIAGNOSIS — E782 Mixed hyperlipidemia: Secondary | ICD-10-CM | POA: Diagnosis not present

## 2021-02-23 DIAGNOSIS — I10 Essential (primary) hypertension: Secondary | ICD-10-CM | POA: Diagnosis not present

## 2021-02-23 DIAGNOSIS — S82892D Other fracture of left lower leg, subsequent encounter for closed fracture with routine healing: Secondary | ICD-10-CM | POA: Diagnosis not present

## 2021-02-23 DIAGNOSIS — M6281 Muscle weakness (generalized): Secondary | ICD-10-CM | POA: Diagnosis not present

## 2021-02-23 DIAGNOSIS — R2681 Unsteadiness on feet: Secondary | ICD-10-CM | POA: Diagnosis not present

## 2021-02-23 DIAGNOSIS — E039 Hypothyroidism, unspecified: Secondary | ICD-10-CM | POA: Diagnosis not present

## 2021-02-23 DIAGNOSIS — D649 Anemia, unspecified: Secondary | ICD-10-CM | POA: Diagnosis not present

## 2021-03-02 DIAGNOSIS — I10 Essential (primary) hypertension: Secondary | ICD-10-CM | POA: Diagnosis not present

## 2021-03-02 DIAGNOSIS — M6281 Muscle weakness (generalized): Secondary | ICD-10-CM | POA: Diagnosis not present

## 2021-03-02 DIAGNOSIS — E782 Mixed hyperlipidemia: Secondary | ICD-10-CM | POA: Diagnosis not present

## 2021-03-02 DIAGNOSIS — S82892D Other fracture of left lower leg, subsequent encounter for closed fracture with routine healing: Secondary | ICD-10-CM | POA: Diagnosis not present

## 2021-03-02 DIAGNOSIS — R2681 Unsteadiness on feet: Secondary | ICD-10-CM | POA: Diagnosis not present

## 2021-03-02 DIAGNOSIS — E119 Type 2 diabetes mellitus without complications: Secondary | ICD-10-CM | POA: Diagnosis not present

## 2021-03-02 DIAGNOSIS — D649 Anemia, unspecified: Secondary | ICD-10-CM | POA: Diagnosis not present

## 2021-03-02 DIAGNOSIS — E039 Hypothyroidism, unspecified: Secondary | ICD-10-CM | POA: Diagnosis not present

## 2021-03-04 DIAGNOSIS — I10 Essential (primary) hypertension: Secondary | ICD-10-CM | POA: Diagnosis not present

## 2021-03-04 DIAGNOSIS — D508 Other iron deficiency anemias: Secondary | ICD-10-CM | POA: Diagnosis not present

## 2021-03-04 DIAGNOSIS — E782 Mixed hyperlipidemia: Secondary | ICD-10-CM | POA: Diagnosis not present

## 2021-03-04 DIAGNOSIS — E1142 Type 2 diabetes mellitus with diabetic polyneuropathy: Secondary | ICD-10-CM | POA: Diagnosis not present

## 2021-03-04 DIAGNOSIS — S82392S Other fracture of lower end of left tibia, sequela: Secondary | ICD-10-CM | POA: Diagnosis not present

## 2021-03-04 DIAGNOSIS — E038 Other specified hypothyroidism: Secondary | ICD-10-CM | POA: Diagnosis not present

## 2021-03-04 DIAGNOSIS — M1991 Primary osteoarthritis, unspecified site: Secondary | ICD-10-CM | POA: Diagnosis not present

## 2021-03-09 DIAGNOSIS — R2681 Unsteadiness on feet: Secondary | ICD-10-CM | POA: Diagnosis not present

## 2021-03-09 DIAGNOSIS — E782 Mixed hyperlipidemia: Secondary | ICD-10-CM | POA: Diagnosis not present

## 2021-03-09 DIAGNOSIS — I10 Essential (primary) hypertension: Secondary | ICD-10-CM | POA: Diagnosis not present

## 2021-03-09 DIAGNOSIS — E039 Hypothyroidism, unspecified: Secondary | ICD-10-CM | POA: Diagnosis not present

## 2021-03-09 DIAGNOSIS — D649 Anemia, unspecified: Secondary | ICD-10-CM | POA: Diagnosis not present

## 2021-03-09 DIAGNOSIS — M6281 Muscle weakness (generalized): Secondary | ICD-10-CM | POA: Diagnosis not present

## 2021-03-09 DIAGNOSIS — E119 Type 2 diabetes mellitus without complications: Secondary | ICD-10-CM | POA: Diagnosis not present

## 2021-03-09 DIAGNOSIS — S82892D Other fracture of left lower leg, subsequent encounter for closed fracture with routine healing: Secondary | ICD-10-CM | POA: Diagnosis not present

## 2021-03-10 DIAGNOSIS — S82842D Displaced bimalleolar fracture of left lower leg, subsequent encounter for closed fracture with routine healing: Secondary | ICD-10-CM | POA: Diagnosis not present

## 2021-03-11 DIAGNOSIS — D649 Anemia, unspecified: Secondary | ICD-10-CM | POA: Diagnosis not present

## 2021-03-11 DIAGNOSIS — E119 Type 2 diabetes mellitus without complications: Secondary | ICD-10-CM | POA: Diagnosis not present

## 2021-03-11 DIAGNOSIS — M6281 Muscle weakness (generalized): Secondary | ICD-10-CM | POA: Diagnosis not present

## 2021-03-11 DIAGNOSIS — E038 Other specified hypothyroidism: Secondary | ICD-10-CM | POA: Diagnosis not present

## 2021-03-11 DIAGNOSIS — E1142 Type 2 diabetes mellitus with diabetic polyneuropathy: Secondary | ICD-10-CM | POA: Diagnosis not present

## 2021-03-11 DIAGNOSIS — S82892D Other fracture of left lower leg, subsequent encounter for closed fracture with routine healing: Secondary | ICD-10-CM | POA: Diagnosis not present

## 2021-03-11 DIAGNOSIS — S82392S Other fracture of lower end of left tibia, sequela: Secondary | ICD-10-CM | POA: Diagnosis not present

## 2021-03-11 DIAGNOSIS — D508 Other iron deficiency anemias: Secondary | ICD-10-CM | POA: Diagnosis not present

## 2021-03-11 DIAGNOSIS — M1991 Primary osteoarthritis, unspecified site: Secondary | ICD-10-CM | POA: Diagnosis not present

## 2021-03-11 DIAGNOSIS — E782 Mixed hyperlipidemia: Secondary | ICD-10-CM | POA: Diagnosis not present

## 2021-03-11 DIAGNOSIS — K5901 Slow transit constipation: Secondary | ICD-10-CM | POA: Diagnosis not present

## 2021-03-11 DIAGNOSIS — E039 Hypothyroidism, unspecified: Secondary | ICD-10-CM | POA: Diagnosis not present

## 2021-03-11 DIAGNOSIS — I10 Essential (primary) hypertension: Secondary | ICD-10-CM | POA: Diagnosis not present

## 2021-03-11 DIAGNOSIS — R2681 Unsteadiness on feet: Secondary | ICD-10-CM | POA: Diagnosis not present

## 2021-03-15 DIAGNOSIS — S82892D Other fracture of left lower leg, subsequent encounter for closed fracture with routine healing: Secondary | ICD-10-CM | POA: Diagnosis not present

## 2021-03-15 DIAGNOSIS — D649 Anemia, unspecified: Secondary | ICD-10-CM | POA: Diagnosis not present

## 2021-03-15 DIAGNOSIS — M6281 Muscle weakness (generalized): Secondary | ICD-10-CM | POA: Diagnosis not present

## 2021-03-15 DIAGNOSIS — R2681 Unsteadiness on feet: Secondary | ICD-10-CM | POA: Diagnosis not present

## 2021-03-15 DIAGNOSIS — E039 Hypothyroidism, unspecified: Secondary | ICD-10-CM | POA: Diagnosis not present

## 2021-03-15 DIAGNOSIS — E119 Type 2 diabetes mellitus without complications: Secondary | ICD-10-CM | POA: Diagnosis not present

## 2021-03-15 DIAGNOSIS — E782 Mixed hyperlipidemia: Secondary | ICD-10-CM | POA: Diagnosis not present

## 2021-03-15 DIAGNOSIS — I10 Essential (primary) hypertension: Secondary | ICD-10-CM | POA: Diagnosis not present

## 2021-03-23 DIAGNOSIS — E1169 Type 2 diabetes mellitus with other specified complication: Secondary | ICD-10-CM | POA: Diagnosis not present

## 2021-03-23 DIAGNOSIS — S82852D Displaced trimalleolar fracture of left lower leg, subsequent encounter for closed fracture with routine healing: Secondary | ICD-10-CM | POA: Diagnosis not present

## 2021-03-23 DIAGNOSIS — I129 Hypertensive chronic kidney disease with stage 1 through stage 4 chronic kidney disease, or unspecified chronic kidney disease: Secondary | ICD-10-CM | POA: Diagnosis not present

## 2021-03-23 DIAGNOSIS — E1122 Type 2 diabetes mellitus with diabetic chronic kidney disease: Secondary | ICD-10-CM | POA: Diagnosis not present

## 2021-03-23 DIAGNOSIS — E782 Mixed hyperlipidemia: Secondary | ICD-10-CM | POA: Diagnosis not present

## 2021-03-23 DIAGNOSIS — D631 Anemia in chronic kidney disease: Secondary | ICD-10-CM | POA: Diagnosis not present

## 2021-03-23 DIAGNOSIS — N189 Chronic kidney disease, unspecified: Secondary | ICD-10-CM | POA: Diagnosis not present

## 2021-03-23 DIAGNOSIS — E1142 Type 2 diabetes mellitus with diabetic polyneuropathy: Secondary | ICD-10-CM | POA: Diagnosis not present

## 2021-03-23 DIAGNOSIS — F32A Depression, unspecified: Secondary | ICD-10-CM | POA: Diagnosis not present

## 2021-03-29 DIAGNOSIS — I129 Hypertensive chronic kidney disease with stage 1 through stage 4 chronic kidney disease, or unspecified chronic kidney disease: Secondary | ICD-10-CM | POA: Diagnosis not present

## 2021-03-29 DIAGNOSIS — N189 Chronic kidney disease, unspecified: Secondary | ICD-10-CM | POA: Diagnosis not present

## 2021-03-29 DIAGNOSIS — E1169 Type 2 diabetes mellitus with other specified complication: Secondary | ICD-10-CM | POA: Diagnosis not present

## 2021-03-29 DIAGNOSIS — E1142 Type 2 diabetes mellitus with diabetic polyneuropathy: Secondary | ICD-10-CM | POA: Diagnosis not present

## 2021-03-29 DIAGNOSIS — F32A Depression, unspecified: Secondary | ICD-10-CM | POA: Diagnosis not present

## 2021-03-29 DIAGNOSIS — D631 Anemia in chronic kidney disease: Secondary | ICD-10-CM | POA: Diagnosis not present

## 2021-03-29 DIAGNOSIS — E782 Mixed hyperlipidemia: Secondary | ICD-10-CM | POA: Diagnosis not present

## 2021-03-29 DIAGNOSIS — S82852D Displaced trimalleolar fracture of left lower leg, subsequent encounter for closed fracture with routine healing: Secondary | ICD-10-CM | POA: Diagnosis not present

## 2021-03-29 DIAGNOSIS — E1122 Type 2 diabetes mellitus with diabetic chronic kidney disease: Secondary | ICD-10-CM | POA: Diagnosis not present

## 2021-03-30 DIAGNOSIS — E782 Mixed hyperlipidemia: Secondary | ICD-10-CM | POA: Diagnosis not present

## 2021-03-30 DIAGNOSIS — I129 Hypertensive chronic kidney disease with stage 1 through stage 4 chronic kidney disease, or unspecified chronic kidney disease: Secondary | ICD-10-CM | POA: Diagnosis not present

## 2021-03-30 DIAGNOSIS — F32A Depression, unspecified: Secondary | ICD-10-CM | POA: Diagnosis not present

## 2021-03-30 DIAGNOSIS — E1169 Type 2 diabetes mellitus with other specified complication: Secondary | ICD-10-CM | POA: Diagnosis not present

## 2021-03-30 DIAGNOSIS — S82852D Displaced trimalleolar fracture of left lower leg, subsequent encounter for closed fracture with routine healing: Secondary | ICD-10-CM | POA: Diagnosis not present

## 2021-03-30 DIAGNOSIS — N189 Chronic kidney disease, unspecified: Secondary | ICD-10-CM | POA: Diagnosis not present

## 2021-03-30 DIAGNOSIS — E1122 Type 2 diabetes mellitus with diabetic chronic kidney disease: Secondary | ICD-10-CM | POA: Diagnosis not present

## 2021-03-30 DIAGNOSIS — D631 Anemia in chronic kidney disease: Secondary | ICD-10-CM | POA: Diagnosis not present

## 2021-03-30 DIAGNOSIS — E1142 Type 2 diabetes mellitus with diabetic polyneuropathy: Secondary | ICD-10-CM | POA: Diagnosis not present

## 2021-04-01 DIAGNOSIS — I129 Hypertensive chronic kidney disease with stage 1 through stage 4 chronic kidney disease, or unspecified chronic kidney disease: Secondary | ICD-10-CM | POA: Diagnosis not present

## 2021-04-01 DIAGNOSIS — D631 Anemia in chronic kidney disease: Secondary | ICD-10-CM | POA: Diagnosis not present

## 2021-04-01 DIAGNOSIS — E1142 Type 2 diabetes mellitus with diabetic polyneuropathy: Secondary | ICD-10-CM | POA: Diagnosis not present

## 2021-04-01 DIAGNOSIS — E1122 Type 2 diabetes mellitus with diabetic chronic kidney disease: Secondary | ICD-10-CM | POA: Diagnosis not present

## 2021-04-01 DIAGNOSIS — E1169 Type 2 diabetes mellitus with other specified complication: Secondary | ICD-10-CM | POA: Diagnosis not present

## 2021-04-01 DIAGNOSIS — S82852D Displaced trimalleolar fracture of left lower leg, subsequent encounter for closed fracture with routine healing: Secondary | ICD-10-CM | POA: Diagnosis not present

## 2021-04-01 DIAGNOSIS — F32A Depression, unspecified: Secondary | ICD-10-CM | POA: Diagnosis not present

## 2021-04-01 DIAGNOSIS — E782 Mixed hyperlipidemia: Secondary | ICD-10-CM | POA: Diagnosis not present

## 2021-04-01 DIAGNOSIS — N189 Chronic kidney disease, unspecified: Secondary | ICD-10-CM | POA: Diagnosis not present

## 2021-04-05 DIAGNOSIS — E782 Mixed hyperlipidemia: Secondary | ICD-10-CM | POA: Diagnosis not present

## 2021-04-05 DIAGNOSIS — I129 Hypertensive chronic kidney disease with stage 1 through stage 4 chronic kidney disease, or unspecified chronic kidney disease: Secondary | ICD-10-CM | POA: Diagnosis not present

## 2021-04-05 DIAGNOSIS — E1122 Type 2 diabetes mellitus with diabetic chronic kidney disease: Secondary | ICD-10-CM | POA: Diagnosis not present

## 2021-04-05 DIAGNOSIS — N189 Chronic kidney disease, unspecified: Secondary | ICD-10-CM | POA: Diagnosis not present

## 2021-04-05 DIAGNOSIS — E1169 Type 2 diabetes mellitus with other specified complication: Secondary | ICD-10-CM | POA: Diagnosis not present

## 2021-04-05 DIAGNOSIS — D631 Anemia in chronic kidney disease: Secondary | ICD-10-CM | POA: Diagnosis not present

## 2021-04-05 DIAGNOSIS — E1142 Type 2 diabetes mellitus with diabetic polyneuropathy: Secondary | ICD-10-CM | POA: Diagnosis not present

## 2021-04-05 DIAGNOSIS — F32A Depression, unspecified: Secondary | ICD-10-CM | POA: Diagnosis not present

## 2021-04-05 DIAGNOSIS — S82852D Displaced trimalleolar fracture of left lower leg, subsequent encounter for closed fracture with routine healing: Secondary | ICD-10-CM | POA: Diagnosis not present

## 2021-04-06 DIAGNOSIS — N189 Chronic kidney disease, unspecified: Secondary | ICD-10-CM | POA: Diagnosis not present

## 2021-04-06 DIAGNOSIS — E1142 Type 2 diabetes mellitus with diabetic polyneuropathy: Secondary | ICD-10-CM | POA: Diagnosis not present

## 2021-04-06 DIAGNOSIS — E1169 Type 2 diabetes mellitus with other specified complication: Secondary | ICD-10-CM | POA: Diagnosis not present

## 2021-04-06 DIAGNOSIS — I129 Hypertensive chronic kidney disease with stage 1 through stage 4 chronic kidney disease, or unspecified chronic kidney disease: Secondary | ICD-10-CM | POA: Diagnosis not present

## 2021-04-06 DIAGNOSIS — S82852D Displaced trimalleolar fracture of left lower leg, subsequent encounter for closed fracture with routine healing: Secondary | ICD-10-CM | POA: Diagnosis not present

## 2021-04-06 DIAGNOSIS — F32A Depression, unspecified: Secondary | ICD-10-CM | POA: Diagnosis not present

## 2021-04-06 DIAGNOSIS — E1122 Type 2 diabetes mellitus with diabetic chronic kidney disease: Secondary | ICD-10-CM | POA: Diagnosis not present

## 2021-04-06 DIAGNOSIS — D631 Anemia in chronic kidney disease: Secondary | ICD-10-CM | POA: Diagnosis not present

## 2021-04-06 DIAGNOSIS — E782 Mixed hyperlipidemia: Secondary | ICD-10-CM | POA: Diagnosis not present

## 2021-04-07 DIAGNOSIS — S82852D Displaced trimalleolar fracture of left lower leg, subsequent encounter for closed fracture with routine healing: Secondary | ICD-10-CM | POA: Diagnosis not present

## 2021-04-07 DIAGNOSIS — E782 Mixed hyperlipidemia: Secondary | ICD-10-CM | POA: Diagnosis not present

## 2021-04-07 DIAGNOSIS — D631 Anemia in chronic kidney disease: Secondary | ICD-10-CM | POA: Diagnosis not present

## 2021-04-07 DIAGNOSIS — F32A Depression, unspecified: Secondary | ICD-10-CM | POA: Diagnosis not present

## 2021-04-07 DIAGNOSIS — E1122 Type 2 diabetes mellitus with diabetic chronic kidney disease: Secondary | ICD-10-CM | POA: Diagnosis not present

## 2021-04-07 DIAGNOSIS — E1142 Type 2 diabetes mellitus with diabetic polyneuropathy: Secondary | ICD-10-CM | POA: Diagnosis not present

## 2021-04-07 DIAGNOSIS — E1169 Type 2 diabetes mellitus with other specified complication: Secondary | ICD-10-CM | POA: Diagnosis not present

## 2021-04-07 DIAGNOSIS — N189 Chronic kidney disease, unspecified: Secondary | ICD-10-CM | POA: Diagnosis not present

## 2021-04-07 DIAGNOSIS — I129 Hypertensive chronic kidney disease with stage 1 through stage 4 chronic kidney disease, or unspecified chronic kidney disease: Secondary | ICD-10-CM | POA: Diagnosis not present

## 2021-04-09 DIAGNOSIS — S82852D Displaced trimalleolar fracture of left lower leg, subsequent encounter for closed fracture with routine healing: Secondary | ICD-10-CM | POA: Diagnosis not present

## 2021-04-09 DIAGNOSIS — E1122 Type 2 diabetes mellitus with diabetic chronic kidney disease: Secondary | ICD-10-CM | POA: Diagnosis not present

## 2021-04-09 DIAGNOSIS — I129 Hypertensive chronic kidney disease with stage 1 through stage 4 chronic kidney disease, or unspecified chronic kidney disease: Secondary | ICD-10-CM | POA: Diagnosis not present

## 2021-04-09 DIAGNOSIS — E1142 Type 2 diabetes mellitus with diabetic polyneuropathy: Secondary | ICD-10-CM | POA: Diagnosis not present

## 2021-04-09 DIAGNOSIS — E782 Mixed hyperlipidemia: Secondary | ICD-10-CM | POA: Diagnosis not present

## 2021-04-09 DIAGNOSIS — N189 Chronic kidney disease, unspecified: Secondary | ICD-10-CM | POA: Diagnosis not present

## 2021-04-09 DIAGNOSIS — F32A Depression, unspecified: Secondary | ICD-10-CM | POA: Diagnosis not present

## 2021-04-09 DIAGNOSIS — D631 Anemia in chronic kidney disease: Secondary | ICD-10-CM | POA: Diagnosis not present

## 2021-04-09 DIAGNOSIS — E1169 Type 2 diabetes mellitus with other specified complication: Secondary | ICD-10-CM | POA: Diagnosis not present

## 2021-04-12 DIAGNOSIS — D631 Anemia in chronic kidney disease: Secondary | ICD-10-CM | POA: Diagnosis not present

## 2021-04-12 DIAGNOSIS — S82852D Displaced trimalleolar fracture of left lower leg, subsequent encounter for closed fracture with routine healing: Secondary | ICD-10-CM | POA: Diagnosis not present

## 2021-04-12 DIAGNOSIS — E1169 Type 2 diabetes mellitus with other specified complication: Secondary | ICD-10-CM | POA: Diagnosis not present

## 2021-04-12 DIAGNOSIS — E782 Mixed hyperlipidemia: Secondary | ICD-10-CM | POA: Diagnosis not present

## 2021-04-12 DIAGNOSIS — F32A Depression, unspecified: Secondary | ICD-10-CM | POA: Diagnosis not present

## 2021-04-12 DIAGNOSIS — E1122 Type 2 diabetes mellitus with diabetic chronic kidney disease: Secondary | ICD-10-CM | POA: Diagnosis not present

## 2021-04-12 DIAGNOSIS — E1142 Type 2 diabetes mellitus with diabetic polyneuropathy: Secondary | ICD-10-CM | POA: Diagnosis not present

## 2021-04-12 DIAGNOSIS — N189 Chronic kidney disease, unspecified: Secondary | ICD-10-CM | POA: Diagnosis not present

## 2021-04-12 DIAGNOSIS — I129 Hypertensive chronic kidney disease with stage 1 through stage 4 chronic kidney disease, or unspecified chronic kidney disease: Secondary | ICD-10-CM | POA: Diagnosis not present

## 2021-04-15 DIAGNOSIS — E1169 Type 2 diabetes mellitus with other specified complication: Secondary | ICD-10-CM | POA: Diagnosis not present

## 2021-04-15 DIAGNOSIS — N189 Chronic kidney disease, unspecified: Secondary | ICD-10-CM | POA: Diagnosis not present

## 2021-04-15 DIAGNOSIS — E1122 Type 2 diabetes mellitus with diabetic chronic kidney disease: Secondary | ICD-10-CM | POA: Diagnosis not present

## 2021-04-15 DIAGNOSIS — I129 Hypertensive chronic kidney disease with stage 1 through stage 4 chronic kidney disease, or unspecified chronic kidney disease: Secondary | ICD-10-CM | POA: Diagnosis not present

## 2021-04-15 DIAGNOSIS — F32A Depression, unspecified: Secondary | ICD-10-CM | POA: Diagnosis not present

## 2021-04-15 DIAGNOSIS — D631 Anemia in chronic kidney disease: Secondary | ICD-10-CM | POA: Diagnosis not present

## 2021-04-15 DIAGNOSIS — S82852D Displaced trimalleolar fracture of left lower leg, subsequent encounter for closed fracture with routine healing: Secondary | ICD-10-CM | POA: Diagnosis not present

## 2021-04-15 DIAGNOSIS — E782 Mixed hyperlipidemia: Secondary | ICD-10-CM | POA: Diagnosis not present

## 2021-04-15 DIAGNOSIS — E1142 Type 2 diabetes mellitus with diabetic polyneuropathy: Secondary | ICD-10-CM | POA: Diagnosis not present

## 2021-04-19 DIAGNOSIS — N189 Chronic kidney disease, unspecified: Secondary | ICD-10-CM | POA: Diagnosis not present

## 2021-04-19 DIAGNOSIS — E782 Mixed hyperlipidemia: Secondary | ICD-10-CM | POA: Diagnosis not present

## 2021-04-19 DIAGNOSIS — E1169 Type 2 diabetes mellitus with other specified complication: Secondary | ICD-10-CM | POA: Diagnosis not present

## 2021-04-19 DIAGNOSIS — E1122 Type 2 diabetes mellitus with diabetic chronic kidney disease: Secondary | ICD-10-CM | POA: Diagnosis not present

## 2021-04-19 DIAGNOSIS — D631 Anemia in chronic kidney disease: Secondary | ICD-10-CM | POA: Diagnosis not present

## 2021-04-19 DIAGNOSIS — I129 Hypertensive chronic kidney disease with stage 1 through stage 4 chronic kidney disease, or unspecified chronic kidney disease: Secondary | ICD-10-CM | POA: Diagnosis not present

## 2021-04-19 DIAGNOSIS — E1142 Type 2 diabetes mellitus with diabetic polyneuropathy: Secondary | ICD-10-CM | POA: Diagnosis not present

## 2021-04-19 DIAGNOSIS — F32A Depression, unspecified: Secondary | ICD-10-CM | POA: Diagnosis not present

## 2021-04-19 DIAGNOSIS — S82852D Displaced trimalleolar fracture of left lower leg, subsequent encounter for closed fracture with routine healing: Secondary | ICD-10-CM | POA: Diagnosis not present

## 2021-04-21 DIAGNOSIS — I10 Essential (primary) hypertension: Secondary | ICD-10-CM | POA: Diagnosis not present

## 2021-04-21 DIAGNOSIS — E119 Type 2 diabetes mellitus without complications: Secondary | ICD-10-CM | POA: Diagnosis not present

## 2021-04-21 DIAGNOSIS — S82892S Other fracture of left lower leg, sequela: Secondary | ICD-10-CM | POA: Diagnosis not present

## 2021-04-21 DIAGNOSIS — E785 Hyperlipidemia, unspecified: Secondary | ICD-10-CM | POA: Diagnosis not present

## 2021-04-21 DIAGNOSIS — E039 Hypothyroidism, unspecified: Secondary | ICD-10-CM | POA: Diagnosis not present

## 2021-04-22 DIAGNOSIS — M25562 Pain in left knee: Secondary | ICD-10-CM | POA: Diagnosis not present

## 2021-04-26 DIAGNOSIS — D631 Anemia in chronic kidney disease: Secondary | ICD-10-CM | POA: Diagnosis not present

## 2021-04-26 DIAGNOSIS — E1169 Type 2 diabetes mellitus with other specified complication: Secondary | ICD-10-CM | POA: Diagnosis not present

## 2021-04-26 DIAGNOSIS — N189 Chronic kidney disease, unspecified: Secondary | ICD-10-CM | POA: Diagnosis not present

## 2021-04-26 DIAGNOSIS — S82852D Displaced trimalleolar fracture of left lower leg, subsequent encounter for closed fracture with routine healing: Secondary | ICD-10-CM | POA: Diagnosis not present

## 2021-04-26 DIAGNOSIS — E1142 Type 2 diabetes mellitus with diabetic polyneuropathy: Secondary | ICD-10-CM | POA: Diagnosis not present

## 2021-04-26 DIAGNOSIS — I129 Hypertensive chronic kidney disease with stage 1 through stage 4 chronic kidney disease, or unspecified chronic kidney disease: Secondary | ICD-10-CM | POA: Diagnosis not present

## 2021-04-26 DIAGNOSIS — E782 Mixed hyperlipidemia: Secondary | ICD-10-CM | POA: Diagnosis not present

## 2021-04-26 DIAGNOSIS — F32A Depression, unspecified: Secondary | ICD-10-CM | POA: Diagnosis not present

## 2021-04-26 DIAGNOSIS — E1122 Type 2 diabetes mellitus with diabetic chronic kidney disease: Secondary | ICD-10-CM | POA: Diagnosis not present

## 2021-04-29 DIAGNOSIS — E782 Mixed hyperlipidemia: Secondary | ICD-10-CM | POA: Diagnosis not present

## 2021-04-29 DIAGNOSIS — E1142 Type 2 diabetes mellitus with diabetic polyneuropathy: Secondary | ICD-10-CM | POA: Diagnosis not present

## 2021-04-29 DIAGNOSIS — E1122 Type 2 diabetes mellitus with diabetic chronic kidney disease: Secondary | ICD-10-CM | POA: Diagnosis not present

## 2021-04-29 DIAGNOSIS — E1169 Type 2 diabetes mellitus with other specified complication: Secondary | ICD-10-CM | POA: Diagnosis not present

## 2021-04-29 DIAGNOSIS — F32A Depression, unspecified: Secondary | ICD-10-CM | POA: Diagnosis not present

## 2021-04-29 DIAGNOSIS — S82852D Displaced trimalleolar fracture of left lower leg, subsequent encounter for closed fracture with routine healing: Secondary | ICD-10-CM | POA: Diagnosis not present

## 2021-04-29 DIAGNOSIS — I129 Hypertensive chronic kidney disease with stage 1 through stage 4 chronic kidney disease, or unspecified chronic kidney disease: Secondary | ICD-10-CM | POA: Diagnosis not present

## 2021-04-29 DIAGNOSIS — N189 Chronic kidney disease, unspecified: Secondary | ICD-10-CM | POA: Diagnosis not present

## 2021-04-29 DIAGNOSIS — D631 Anemia in chronic kidney disease: Secondary | ICD-10-CM | POA: Diagnosis not present

## 2021-05-03 DIAGNOSIS — F32A Depression, unspecified: Secondary | ICD-10-CM | POA: Diagnosis not present

## 2021-05-03 DIAGNOSIS — E782 Mixed hyperlipidemia: Secondary | ICD-10-CM | POA: Diagnosis not present

## 2021-05-03 DIAGNOSIS — D631 Anemia in chronic kidney disease: Secondary | ICD-10-CM | POA: Diagnosis not present

## 2021-05-03 DIAGNOSIS — I129 Hypertensive chronic kidney disease with stage 1 through stage 4 chronic kidney disease, or unspecified chronic kidney disease: Secondary | ICD-10-CM | POA: Diagnosis not present

## 2021-05-03 DIAGNOSIS — N189 Chronic kidney disease, unspecified: Secondary | ICD-10-CM | POA: Diagnosis not present

## 2021-05-03 DIAGNOSIS — E1169 Type 2 diabetes mellitus with other specified complication: Secondary | ICD-10-CM | POA: Diagnosis not present

## 2021-05-03 DIAGNOSIS — S82852D Displaced trimalleolar fracture of left lower leg, subsequent encounter for closed fracture with routine healing: Secondary | ICD-10-CM | POA: Diagnosis not present

## 2021-05-03 DIAGNOSIS — E1122 Type 2 diabetes mellitus with diabetic chronic kidney disease: Secondary | ICD-10-CM | POA: Diagnosis not present

## 2021-05-03 DIAGNOSIS — E1142 Type 2 diabetes mellitus with diabetic polyneuropathy: Secondary | ICD-10-CM | POA: Diagnosis not present

## 2021-05-10 DIAGNOSIS — E1122 Type 2 diabetes mellitus with diabetic chronic kidney disease: Secondary | ICD-10-CM | POA: Diagnosis not present

## 2021-05-10 DIAGNOSIS — I129 Hypertensive chronic kidney disease with stage 1 through stage 4 chronic kidney disease, or unspecified chronic kidney disease: Secondary | ICD-10-CM | POA: Diagnosis not present

## 2021-05-10 DIAGNOSIS — D631 Anemia in chronic kidney disease: Secondary | ICD-10-CM | POA: Diagnosis not present

## 2021-05-10 DIAGNOSIS — E782 Mixed hyperlipidemia: Secondary | ICD-10-CM | POA: Diagnosis not present

## 2021-05-10 DIAGNOSIS — F32A Depression, unspecified: Secondary | ICD-10-CM | POA: Diagnosis not present

## 2021-05-10 DIAGNOSIS — N189 Chronic kidney disease, unspecified: Secondary | ICD-10-CM | POA: Diagnosis not present

## 2021-05-10 DIAGNOSIS — S82852D Displaced trimalleolar fracture of left lower leg, subsequent encounter for closed fracture with routine healing: Secondary | ICD-10-CM | POA: Diagnosis not present

## 2021-05-10 DIAGNOSIS — E1169 Type 2 diabetes mellitus with other specified complication: Secondary | ICD-10-CM | POA: Diagnosis not present

## 2021-05-10 DIAGNOSIS — E1142 Type 2 diabetes mellitus with diabetic polyneuropathy: Secondary | ICD-10-CM | POA: Diagnosis not present

## 2021-05-11 DIAGNOSIS — E1122 Type 2 diabetes mellitus with diabetic chronic kidney disease: Secondary | ICD-10-CM | POA: Diagnosis not present

## 2021-05-11 DIAGNOSIS — S82852D Displaced trimalleolar fracture of left lower leg, subsequent encounter for closed fracture with routine healing: Secondary | ICD-10-CM | POA: Diagnosis not present

## 2021-05-11 DIAGNOSIS — E1169 Type 2 diabetes mellitus with other specified complication: Secondary | ICD-10-CM | POA: Diagnosis not present

## 2021-05-11 DIAGNOSIS — N189 Chronic kidney disease, unspecified: Secondary | ICD-10-CM | POA: Diagnosis not present

## 2021-05-11 DIAGNOSIS — D631 Anemia in chronic kidney disease: Secondary | ICD-10-CM | POA: Diagnosis not present

## 2021-05-11 DIAGNOSIS — I129 Hypertensive chronic kidney disease with stage 1 through stage 4 chronic kidney disease, or unspecified chronic kidney disease: Secondary | ICD-10-CM | POA: Diagnosis not present

## 2021-05-11 DIAGNOSIS — F32A Depression, unspecified: Secondary | ICD-10-CM | POA: Diagnosis not present

## 2021-05-11 DIAGNOSIS — E1142 Type 2 diabetes mellitus with diabetic polyneuropathy: Secondary | ICD-10-CM | POA: Diagnosis not present

## 2021-05-11 DIAGNOSIS — E782 Mixed hyperlipidemia: Secondary | ICD-10-CM | POA: Diagnosis not present

## 2021-05-13 DIAGNOSIS — N390 Urinary tract infection, site not specified: Secondary | ICD-10-CM | POA: Diagnosis not present

## 2021-05-17 DIAGNOSIS — F32A Depression, unspecified: Secondary | ICD-10-CM | POA: Diagnosis not present

## 2021-05-17 DIAGNOSIS — E1122 Type 2 diabetes mellitus with diabetic chronic kidney disease: Secondary | ICD-10-CM | POA: Diagnosis not present

## 2021-05-17 DIAGNOSIS — N189 Chronic kidney disease, unspecified: Secondary | ICD-10-CM | POA: Diagnosis not present

## 2021-05-17 DIAGNOSIS — E1169 Type 2 diabetes mellitus with other specified complication: Secondary | ICD-10-CM | POA: Diagnosis not present

## 2021-05-17 DIAGNOSIS — D631 Anemia in chronic kidney disease: Secondary | ICD-10-CM | POA: Diagnosis not present

## 2021-05-17 DIAGNOSIS — E1142 Type 2 diabetes mellitus with diabetic polyneuropathy: Secondary | ICD-10-CM | POA: Diagnosis not present

## 2021-05-17 DIAGNOSIS — E782 Mixed hyperlipidemia: Secondary | ICD-10-CM | POA: Diagnosis not present

## 2021-05-17 DIAGNOSIS — S82852D Displaced trimalleolar fracture of left lower leg, subsequent encounter for closed fracture with routine healing: Secondary | ICD-10-CM | POA: Diagnosis not present

## 2021-05-17 DIAGNOSIS — I129 Hypertensive chronic kidney disease with stage 1 through stage 4 chronic kidney disease, or unspecified chronic kidney disease: Secondary | ICD-10-CM | POA: Diagnosis not present

## 2021-05-19 DIAGNOSIS — I129 Hypertensive chronic kidney disease with stage 1 through stage 4 chronic kidney disease, or unspecified chronic kidney disease: Secondary | ICD-10-CM | POA: Diagnosis not present

## 2021-05-19 DIAGNOSIS — N189 Chronic kidney disease, unspecified: Secondary | ICD-10-CM | POA: Diagnosis not present

## 2021-05-19 DIAGNOSIS — E782 Mixed hyperlipidemia: Secondary | ICD-10-CM | POA: Diagnosis not present

## 2021-05-19 DIAGNOSIS — E1169 Type 2 diabetes mellitus with other specified complication: Secondary | ICD-10-CM | POA: Diagnosis not present

## 2021-05-19 DIAGNOSIS — E1142 Type 2 diabetes mellitus with diabetic polyneuropathy: Secondary | ICD-10-CM | POA: Diagnosis not present

## 2021-05-19 DIAGNOSIS — E1122 Type 2 diabetes mellitus with diabetic chronic kidney disease: Secondary | ICD-10-CM | POA: Diagnosis not present

## 2021-05-19 DIAGNOSIS — S82852D Displaced trimalleolar fracture of left lower leg, subsequent encounter for closed fracture with routine healing: Secondary | ICD-10-CM | POA: Diagnosis not present

## 2021-05-19 DIAGNOSIS — F32A Depression, unspecified: Secondary | ICD-10-CM | POA: Diagnosis not present

## 2021-05-19 DIAGNOSIS — D631 Anemia in chronic kidney disease: Secondary | ICD-10-CM | POA: Diagnosis not present

## 2021-05-20 DIAGNOSIS — S82852D Displaced trimalleolar fracture of left lower leg, subsequent encounter for closed fracture with routine healing: Secondary | ICD-10-CM | POA: Diagnosis not present

## 2021-05-20 DIAGNOSIS — E1142 Type 2 diabetes mellitus with diabetic polyneuropathy: Secondary | ICD-10-CM | POA: Diagnosis not present

## 2021-05-20 DIAGNOSIS — F32A Depression, unspecified: Secondary | ICD-10-CM | POA: Diagnosis not present

## 2021-05-20 DIAGNOSIS — E782 Mixed hyperlipidemia: Secondary | ICD-10-CM | POA: Diagnosis not present

## 2021-05-20 DIAGNOSIS — D631 Anemia in chronic kidney disease: Secondary | ICD-10-CM | POA: Diagnosis not present

## 2021-05-20 DIAGNOSIS — E1122 Type 2 diabetes mellitus with diabetic chronic kidney disease: Secondary | ICD-10-CM | POA: Diagnosis not present

## 2021-05-20 DIAGNOSIS — I129 Hypertensive chronic kidney disease with stage 1 through stage 4 chronic kidney disease, or unspecified chronic kidney disease: Secondary | ICD-10-CM | POA: Diagnosis not present

## 2021-05-20 DIAGNOSIS — N189 Chronic kidney disease, unspecified: Secondary | ICD-10-CM | POA: Diagnosis not present

## 2021-05-20 DIAGNOSIS — E1169 Type 2 diabetes mellitus with other specified complication: Secondary | ICD-10-CM | POA: Diagnosis not present

## 2021-05-24 DIAGNOSIS — E782 Mixed hyperlipidemia: Secondary | ICD-10-CM | POA: Diagnosis not present

## 2021-05-24 DIAGNOSIS — S82852D Displaced trimalleolar fracture of left lower leg, subsequent encounter for closed fracture with routine healing: Secondary | ICD-10-CM | POA: Diagnosis not present

## 2021-05-24 DIAGNOSIS — D631 Anemia in chronic kidney disease: Secondary | ICD-10-CM | POA: Diagnosis not present

## 2021-05-24 DIAGNOSIS — N189 Chronic kidney disease, unspecified: Secondary | ICD-10-CM | POA: Diagnosis not present

## 2021-05-24 DIAGNOSIS — E1169 Type 2 diabetes mellitus with other specified complication: Secondary | ICD-10-CM | POA: Diagnosis not present

## 2021-05-24 DIAGNOSIS — I129 Hypertensive chronic kidney disease with stage 1 through stage 4 chronic kidney disease, or unspecified chronic kidney disease: Secondary | ICD-10-CM | POA: Diagnosis not present

## 2021-05-24 DIAGNOSIS — E1142 Type 2 diabetes mellitus with diabetic polyneuropathy: Secondary | ICD-10-CM | POA: Diagnosis not present

## 2021-05-24 DIAGNOSIS — F32A Depression, unspecified: Secondary | ICD-10-CM | POA: Diagnosis not present

## 2021-05-24 DIAGNOSIS — E1122 Type 2 diabetes mellitus with diabetic chronic kidney disease: Secondary | ICD-10-CM | POA: Diagnosis not present

## 2021-05-25 DIAGNOSIS — F32A Depression, unspecified: Secondary | ICD-10-CM | POA: Diagnosis not present

## 2021-05-25 DIAGNOSIS — I129 Hypertensive chronic kidney disease with stage 1 through stage 4 chronic kidney disease, or unspecified chronic kidney disease: Secondary | ICD-10-CM | POA: Diagnosis not present

## 2021-05-25 DIAGNOSIS — E1169 Type 2 diabetes mellitus with other specified complication: Secondary | ICD-10-CM | POA: Diagnosis not present

## 2021-05-25 DIAGNOSIS — D631 Anemia in chronic kidney disease: Secondary | ICD-10-CM | POA: Diagnosis not present

## 2021-05-25 DIAGNOSIS — E1122 Type 2 diabetes mellitus with diabetic chronic kidney disease: Secondary | ICD-10-CM | POA: Diagnosis not present

## 2021-05-25 DIAGNOSIS — E782 Mixed hyperlipidemia: Secondary | ICD-10-CM | POA: Diagnosis not present

## 2021-05-25 DIAGNOSIS — S82852D Displaced trimalleolar fracture of left lower leg, subsequent encounter for closed fracture with routine healing: Secondary | ICD-10-CM | POA: Diagnosis not present

## 2021-05-25 DIAGNOSIS — E1142 Type 2 diabetes mellitus with diabetic polyneuropathy: Secondary | ICD-10-CM | POA: Diagnosis not present

## 2021-05-25 DIAGNOSIS — N189 Chronic kidney disease, unspecified: Secondary | ICD-10-CM | POA: Diagnosis not present

## 2021-05-28 DIAGNOSIS — E1169 Type 2 diabetes mellitus with other specified complication: Secondary | ICD-10-CM | POA: Diagnosis not present

## 2021-05-28 DIAGNOSIS — I129 Hypertensive chronic kidney disease with stage 1 through stage 4 chronic kidney disease, or unspecified chronic kidney disease: Secondary | ICD-10-CM | POA: Diagnosis not present

## 2021-05-28 DIAGNOSIS — E1142 Type 2 diabetes mellitus with diabetic polyneuropathy: Secondary | ICD-10-CM | POA: Diagnosis not present

## 2021-05-28 DIAGNOSIS — E1122 Type 2 diabetes mellitus with diabetic chronic kidney disease: Secondary | ICD-10-CM | POA: Diagnosis not present

## 2021-05-28 DIAGNOSIS — F32A Depression, unspecified: Secondary | ICD-10-CM | POA: Diagnosis not present

## 2021-05-28 DIAGNOSIS — D631 Anemia in chronic kidney disease: Secondary | ICD-10-CM | POA: Diagnosis not present

## 2021-05-28 DIAGNOSIS — N189 Chronic kidney disease, unspecified: Secondary | ICD-10-CM | POA: Diagnosis not present

## 2021-05-31 DIAGNOSIS — I129 Hypertensive chronic kidney disease with stage 1 through stage 4 chronic kidney disease, or unspecified chronic kidney disease: Secondary | ICD-10-CM | POA: Diagnosis not present

## 2021-05-31 DIAGNOSIS — E1169 Type 2 diabetes mellitus with other specified complication: Secondary | ICD-10-CM | POA: Diagnosis not present

## 2021-05-31 DIAGNOSIS — N189 Chronic kidney disease, unspecified: Secondary | ICD-10-CM | POA: Diagnosis not present

## 2021-05-31 DIAGNOSIS — D631 Anemia in chronic kidney disease: Secondary | ICD-10-CM | POA: Diagnosis not present

## 2021-05-31 DIAGNOSIS — E782 Mixed hyperlipidemia: Secondary | ICD-10-CM | POA: Diagnosis not present

## 2021-05-31 DIAGNOSIS — F32A Depression, unspecified: Secondary | ICD-10-CM | POA: Diagnosis not present

## 2021-05-31 DIAGNOSIS — E1122 Type 2 diabetes mellitus with diabetic chronic kidney disease: Secondary | ICD-10-CM | POA: Diagnosis not present

## 2021-05-31 DIAGNOSIS — E1142 Type 2 diabetes mellitus with diabetic polyneuropathy: Secondary | ICD-10-CM | POA: Diagnosis not present

## 2021-05-31 DIAGNOSIS — S82852D Displaced trimalleolar fracture of left lower leg, subsequent encounter for closed fracture with routine healing: Secondary | ICD-10-CM | POA: Diagnosis not present

## 2021-06-01 DIAGNOSIS — E1169 Type 2 diabetes mellitus with other specified complication: Secondary | ICD-10-CM | POA: Diagnosis not present

## 2021-06-01 DIAGNOSIS — D631 Anemia in chronic kidney disease: Secondary | ICD-10-CM | POA: Diagnosis not present

## 2021-06-01 DIAGNOSIS — E782 Mixed hyperlipidemia: Secondary | ICD-10-CM | POA: Diagnosis not present

## 2021-06-01 DIAGNOSIS — S82852D Displaced trimalleolar fracture of left lower leg, subsequent encounter for closed fracture with routine healing: Secondary | ICD-10-CM | POA: Diagnosis not present

## 2021-06-01 DIAGNOSIS — E1122 Type 2 diabetes mellitus with diabetic chronic kidney disease: Secondary | ICD-10-CM | POA: Diagnosis not present

## 2021-06-01 DIAGNOSIS — F32A Depression, unspecified: Secondary | ICD-10-CM | POA: Diagnosis not present

## 2021-06-01 DIAGNOSIS — N189 Chronic kidney disease, unspecified: Secondary | ICD-10-CM | POA: Diagnosis not present

## 2021-06-01 DIAGNOSIS — E1142 Type 2 diabetes mellitus with diabetic polyneuropathy: Secondary | ICD-10-CM | POA: Diagnosis not present

## 2021-06-01 DIAGNOSIS — I129 Hypertensive chronic kidney disease with stage 1 through stage 4 chronic kidney disease, or unspecified chronic kidney disease: Secondary | ICD-10-CM | POA: Diagnosis not present

## 2021-06-07 DIAGNOSIS — N189 Chronic kidney disease, unspecified: Secondary | ICD-10-CM | POA: Diagnosis not present

## 2021-06-07 DIAGNOSIS — I129 Hypertensive chronic kidney disease with stage 1 through stage 4 chronic kidney disease, or unspecified chronic kidney disease: Secondary | ICD-10-CM | POA: Diagnosis not present

## 2021-06-07 DIAGNOSIS — E1142 Type 2 diabetes mellitus with diabetic polyneuropathy: Secondary | ICD-10-CM | POA: Diagnosis not present

## 2021-06-07 DIAGNOSIS — E1169 Type 2 diabetes mellitus with other specified complication: Secondary | ICD-10-CM | POA: Diagnosis not present

## 2021-06-07 DIAGNOSIS — D631 Anemia in chronic kidney disease: Secondary | ICD-10-CM | POA: Diagnosis not present

## 2021-06-07 DIAGNOSIS — S82852D Displaced trimalleolar fracture of left lower leg, subsequent encounter for closed fracture with routine healing: Secondary | ICD-10-CM | POA: Diagnosis not present

## 2021-06-07 DIAGNOSIS — E782 Mixed hyperlipidemia: Secondary | ICD-10-CM | POA: Diagnosis not present

## 2021-06-07 DIAGNOSIS — E1122 Type 2 diabetes mellitus with diabetic chronic kidney disease: Secondary | ICD-10-CM | POA: Diagnosis not present

## 2021-06-07 DIAGNOSIS — F32A Depression, unspecified: Secondary | ICD-10-CM | POA: Diagnosis not present

## 2021-06-15 DIAGNOSIS — N189 Chronic kidney disease, unspecified: Secondary | ICD-10-CM | POA: Diagnosis not present

## 2021-06-15 DIAGNOSIS — E1142 Type 2 diabetes mellitus with diabetic polyneuropathy: Secondary | ICD-10-CM | POA: Diagnosis not present

## 2021-06-15 DIAGNOSIS — E1169 Type 2 diabetes mellitus with other specified complication: Secondary | ICD-10-CM | POA: Diagnosis not present

## 2021-06-15 DIAGNOSIS — S82852D Displaced trimalleolar fracture of left lower leg, subsequent encounter for closed fracture with routine healing: Secondary | ICD-10-CM | POA: Diagnosis not present

## 2021-06-15 DIAGNOSIS — E1122 Type 2 diabetes mellitus with diabetic chronic kidney disease: Secondary | ICD-10-CM | POA: Diagnosis not present

## 2021-06-15 DIAGNOSIS — E782 Mixed hyperlipidemia: Secondary | ICD-10-CM | POA: Diagnosis not present

## 2021-06-15 DIAGNOSIS — D631 Anemia in chronic kidney disease: Secondary | ICD-10-CM | POA: Diagnosis not present

## 2021-06-15 DIAGNOSIS — I129 Hypertensive chronic kidney disease with stage 1 through stage 4 chronic kidney disease, or unspecified chronic kidney disease: Secondary | ICD-10-CM | POA: Diagnosis not present

## 2021-06-15 DIAGNOSIS — F32A Depression, unspecified: Secondary | ICD-10-CM | POA: Diagnosis not present

## 2021-06-16 DIAGNOSIS — E1142 Type 2 diabetes mellitus with diabetic polyneuropathy: Secondary | ICD-10-CM | POA: Diagnosis not present

## 2021-06-16 DIAGNOSIS — E1169 Type 2 diabetes mellitus with other specified complication: Secondary | ICD-10-CM | POA: Diagnosis not present

## 2021-06-16 DIAGNOSIS — E782 Mixed hyperlipidemia: Secondary | ICD-10-CM | POA: Diagnosis not present

## 2021-06-16 DIAGNOSIS — F32A Depression, unspecified: Secondary | ICD-10-CM | POA: Diagnosis not present

## 2021-06-16 DIAGNOSIS — S82852D Displaced trimalleolar fracture of left lower leg, subsequent encounter for closed fracture with routine healing: Secondary | ICD-10-CM | POA: Diagnosis not present

## 2021-06-16 DIAGNOSIS — E1122 Type 2 diabetes mellitus with diabetic chronic kidney disease: Secondary | ICD-10-CM | POA: Diagnosis not present

## 2021-06-16 DIAGNOSIS — D631 Anemia in chronic kidney disease: Secondary | ICD-10-CM | POA: Diagnosis not present

## 2021-06-16 DIAGNOSIS — N189 Chronic kidney disease, unspecified: Secondary | ICD-10-CM | POA: Diagnosis not present

## 2021-06-16 DIAGNOSIS — I129 Hypertensive chronic kidney disease with stage 1 through stage 4 chronic kidney disease, or unspecified chronic kidney disease: Secondary | ICD-10-CM | POA: Diagnosis not present

## 2021-07-01 DIAGNOSIS — Z23 Encounter for immunization: Secondary | ICD-10-CM | POA: Diagnosis not present

## 2021-08-03 DIAGNOSIS — F419 Anxiety disorder, unspecified: Secondary | ICD-10-CM | POA: Diagnosis not present

## 2021-08-03 DIAGNOSIS — E785 Hyperlipidemia, unspecified: Secondary | ICD-10-CM | POA: Diagnosis not present

## 2021-08-03 DIAGNOSIS — E039 Hypothyroidism, unspecified: Secondary | ICD-10-CM | POA: Diagnosis not present

## 2021-08-03 DIAGNOSIS — F32A Depression, unspecified: Secondary | ICD-10-CM | POA: Diagnosis not present

## 2021-08-03 DIAGNOSIS — Z0000 Encounter for general adult medical examination without abnormal findings: Secondary | ICD-10-CM | POA: Diagnosis not present

## 2021-08-03 DIAGNOSIS — E119 Type 2 diabetes mellitus without complications: Secondary | ICD-10-CM | POA: Diagnosis not present

## 2021-08-03 DIAGNOSIS — N289 Disorder of kidney and ureter, unspecified: Secondary | ICD-10-CM | POA: Diagnosis not present

## 2022-02-01 DIAGNOSIS — I10 Essential (primary) hypertension: Secondary | ICD-10-CM | POA: Diagnosis not present

## 2022-02-01 DIAGNOSIS — F419 Anxiety disorder, unspecified: Secondary | ICD-10-CM | POA: Diagnosis not present

## 2022-02-01 DIAGNOSIS — E119 Type 2 diabetes mellitus without complications: Secondary | ICD-10-CM | POA: Diagnosis not present

## 2022-02-01 DIAGNOSIS — E785 Hyperlipidemia, unspecified: Secondary | ICD-10-CM | POA: Diagnosis not present

## 2022-02-01 DIAGNOSIS — N644 Mastodynia: Secondary | ICD-10-CM | POA: Diagnosis not present

## 2022-02-01 DIAGNOSIS — N289 Disorder of kidney and ureter, unspecified: Secondary | ICD-10-CM | POA: Diagnosis not present

## 2022-02-01 DIAGNOSIS — E039 Hypothyroidism, unspecified: Secondary | ICD-10-CM | POA: Diagnosis not present

## 2022-02-03 ENCOUNTER — Other Ambulatory Visit: Payer: Self-pay | Admitting: Family Medicine

## 2022-02-03 DIAGNOSIS — N644 Mastodynia: Secondary | ICD-10-CM

## 2022-02-03 DIAGNOSIS — Z1231 Encounter for screening mammogram for malignant neoplasm of breast: Secondary | ICD-10-CM

## 2022-02-25 ENCOUNTER — Ambulatory Visit
Admission: RE | Admit: 2022-02-25 | Discharge: 2022-02-25 | Disposition: A | Discharge status: Home / Self Care | Payer: Medicare HMO | Source: Ambulatory Visit | Attending: Family Medicine | Admitting: Family Medicine

## 2022-02-25 DIAGNOSIS — N62 Hypertrophy of breast: Secondary | ICD-10-CM | POA: Diagnosis not present

## 2022-02-25 DIAGNOSIS — N644 Mastodynia: Secondary | ICD-10-CM

## 2022-02-25 DIAGNOSIS — Z1231 Encounter for screening mammogram for malignant neoplasm of breast: Secondary | ICD-10-CM | POA: Diagnosis not present

## 2022-06-30 DIAGNOSIS — Z23 Encounter for immunization: Secondary | ICD-10-CM | POA: Diagnosis not present

## 2022-08-02 ENCOUNTER — Ambulatory Visit (LOCAL_COMMUNITY_HEALTH_CENTER): Payer: Medicare HMO

## 2022-08-02 DIAGNOSIS — Z719 Counseling, unspecified: Secondary | ICD-10-CM

## 2022-08-02 DIAGNOSIS — Z23 Encounter for immunization: Secondary | ICD-10-CM

## 2022-08-02 NOTE — Progress Notes (Signed)
  Are you feeling sick today? No   Have you ever received a dose of COVID-19 Vaccine? [employer], [place], [place], [place], Other) Yes  If yes, which vaccine and how many doses?   4 doses Pfizer   Did you bring the vaccination record card or other documentation?  Yes   Do you have a health condition or are undergoing treatment that makes you moderately or severely immunocompromised? This would include, but not be limited to: cancer, HIV, organ transplant, immunosuppressive therapy/high-dose corticosteroids, or moderate/severe primary immunodeficiency.  No  Have you received COVID-19 vaccine before or during hematopoietic cell transplant (HCT) or CAR-T-cell therapies? No  Have you ever had an allergic reaction to: (This would include a severe allergic reaction or a reaction that caused hives, swelling, or respiratory distress, including wheezing.) A component of a COVID-19 vaccine or a previous dose of COVID-19 vaccine? No   Have you ever had an allergic reaction to another vaccine (other thanCOVID-19 vaccine) or an injectable medication? (This would include a severe allergic reaction or a reaction that caused hives, swelling, or respiratory distress, including wheezing.)   No    Do you have a history of any of the following:  Myocarditis or Pericarditis No  Dermal fillers:  No  Multisystem Inflammatory Syndrome (MIS-C or MIS-A)? No  COVID-19 disease within the past 3 months? No  Vaccinated with monkeypox vaccine in the last 4 weeks? No  Given IM left deltoid.  Tolerated well.  COVID card updated.  [facility] updated and copy provided. VIS provided. Waited 15 minutes.

## 2022-08-09 DIAGNOSIS — E119 Type 2 diabetes mellitus without complications: Secondary | ICD-10-CM | POA: Diagnosis not present

## 2022-08-09 DIAGNOSIS — E039 Hypothyroidism, unspecified: Secondary | ICD-10-CM | POA: Diagnosis not present

## 2022-08-09 DIAGNOSIS — Z0000 Encounter for general adult medical examination without abnormal findings: Secondary | ICD-10-CM | POA: Diagnosis not present

## 2022-08-09 DIAGNOSIS — E785 Hyperlipidemia, unspecified: Secondary | ICD-10-CM | POA: Diagnosis not present

## 2022-08-09 DIAGNOSIS — E1122 Type 2 diabetes mellitus with diabetic chronic kidney disease: Secondary | ICD-10-CM | POA: Diagnosis not present

## 2022-08-09 DIAGNOSIS — I129 Hypertensive chronic kidney disease with stage 1 through stage 4 chronic kidney disease, or unspecified chronic kidney disease: Secondary | ICD-10-CM | POA: Diagnosis not present

## 2022-08-09 DIAGNOSIS — I10 Essential (primary) hypertension: Secondary | ICD-10-CM | POA: Diagnosis not present

## 2022-08-09 DIAGNOSIS — Z1331 Encounter for screening for depression: Secondary | ICD-10-CM | POA: Diagnosis not present

## 2022-08-09 DIAGNOSIS — F419 Anxiety disorder, unspecified: Secondary | ICD-10-CM | POA: Diagnosis not present

## 2022-08-10 ENCOUNTER — Telehealth: Payer: Self-pay | Admitting: Family Medicine

## 2022-08-10 NOTE — Telephone Encounter (Signed)
Pt has an appt for IMM as requested by the Doctor, but has couple of questions: 1) can he receive all those vaccines at the same time? RSV, Shingles, Pneumonia. 2) Is there a newer version of the Pneumonia for a booster? He had the prevnar 23 in the past. Please give him a call back. His appt is for 12/20th, thanks.

## 2022-08-24 ENCOUNTER — Ambulatory Visit (LOCAL_COMMUNITY_HEALTH_CENTER): Payer: Medicare HMO

## 2022-08-24 DIAGNOSIS — Z23 Encounter for immunization: Secondary | ICD-10-CM | POA: Diagnosis not present

## 2022-08-24 DIAGNOSIS — Z719 Counseling, unspecified: Secondary | ICD-10-CM

## 2022-08-24 NOTE — Progress Notes (Signed)
Pt was seen for RSV and Prevnar 20. Eligible, administered vaccines, tolerated well. Provided VIS and [facility] copy. M.Welborn Keena, LPN.

## 2022-09-08 ENCOUNTER — Ambulatory Visit: Payer: Medicare HMO

## 2022-09-08 DIAGNOSIS — I10 Essential (primary) hypertension: Secondary | ICD-10-CM | POA: Diagnosis not present

## 2022-09-20 ENCOUNTER — Ambulatory Visit: Payer: Medicare HMO

## 2023-01-25 DIAGNOSIS — H26491 Other secondary cataract, right eye: Secondary | ICD-10-CM | POA: Diagnosis not present

## 2023-01-25 DIAGNOSIS — H26493 Other secondary cataract, bilateral: Secondary | ICD-10-CM | POA: Diagnosis not present

## 2023-01-25 DIAGNOSIS — Z961 Presence of intraocular lens: Secondary | ICD-10-CM | POA: Diagnosis not present

## 2023-01-25 DIAGNOSIS — H26492 Other secondary cataract, left eye: Secondary | ICD-10-CM | POA: Diagnosis not present

## 2023-01-25 DIAGNOSIS — E119 Type 2 diabetes mellitus without complications: Secondary | ICD-10-CM | POA: Diagnosis not present

## 2023-02-03 IMAGING — CT CT HEAD W/O CM
3 series · 15 of 47 positions shown, 18 images · non-contrast
Comparison: None.

CLINICAL DATA: Fall

EXAM:
CT HEAD WITHOUT CONTRAST
CT MAXILLOFACIAL WITHOUT CONTRAST
TECHNIQUE: Multidetector CT imaging of the head and maxillofacial structures
were performed using the standard protocol without intravenous
contrast. Multiplanar CT image reconstructions of the maxillofacial
structures were also generated.

[Series 2: head wo · axial · 0.46mm/px · z∈[+385,+525]mm · 9 of 34 slices shown, 12 images]
[im 3/34  brain]
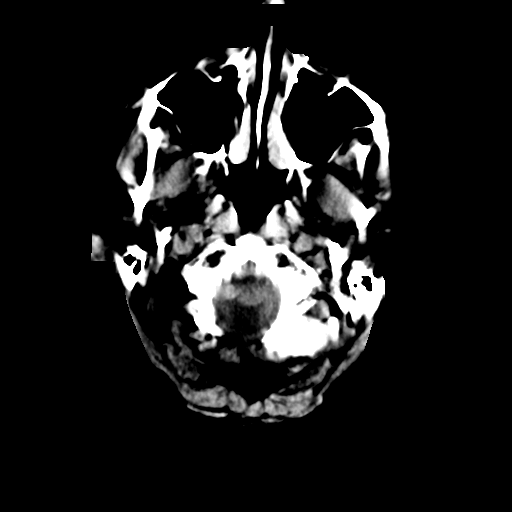
[im 3/34  bone]
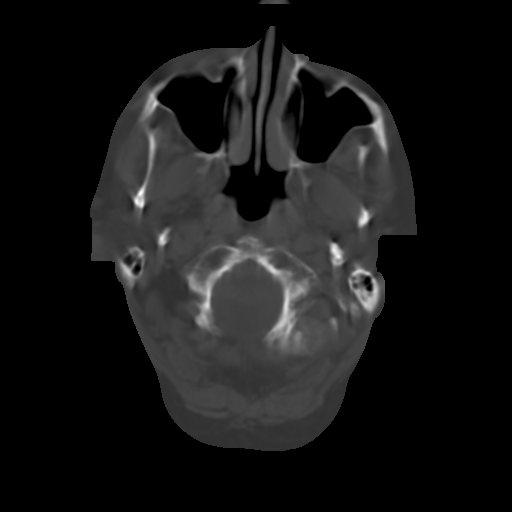
[im 6/34  brain]
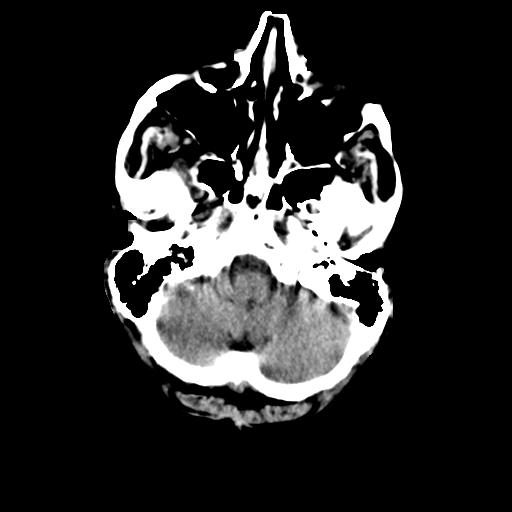
[im 10/34  brain]
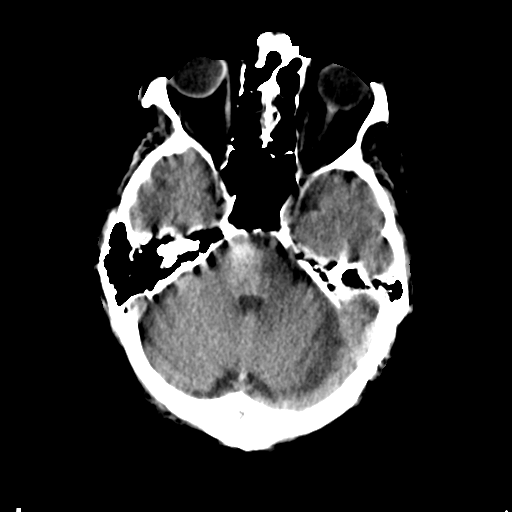
[im 13/34  brain]
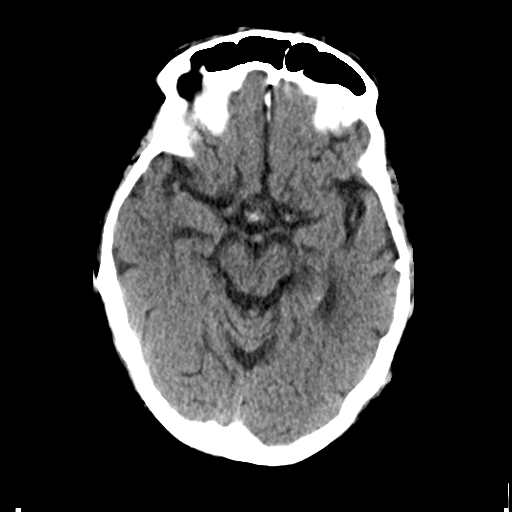
[im 18/34  brain]
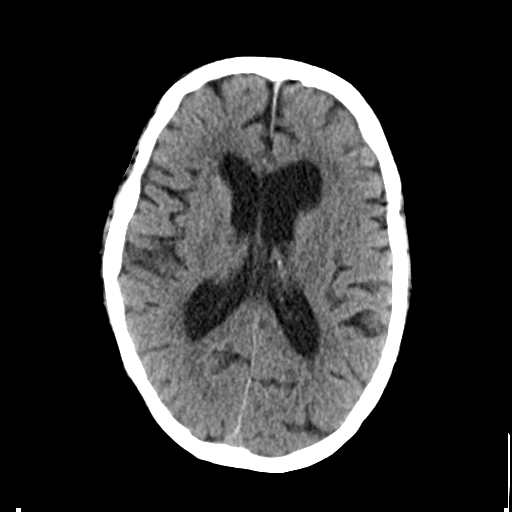
[im 18/34  bone]
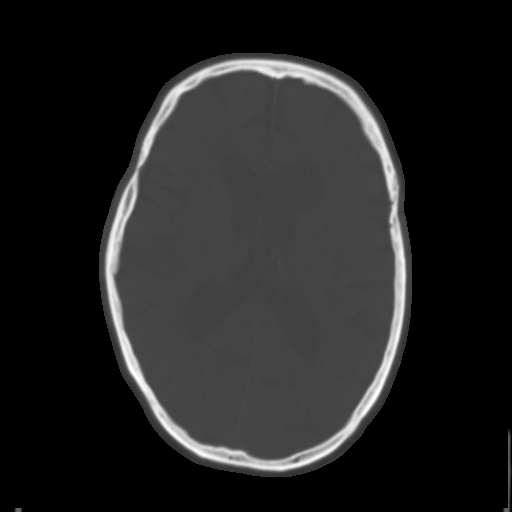
[im 21/34  brain]
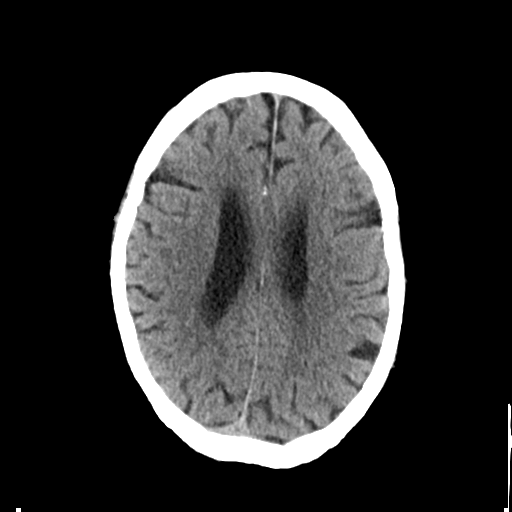
[im 24/34  brain]
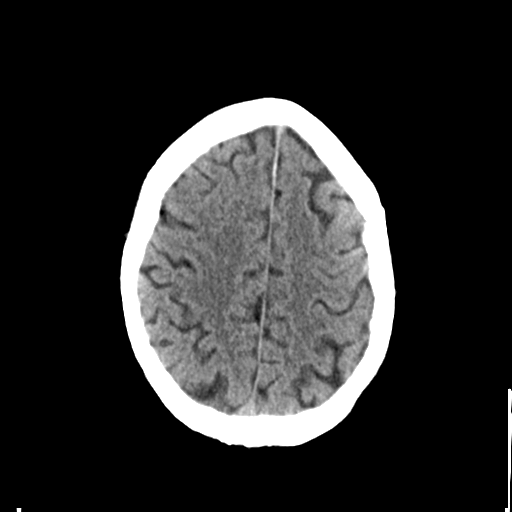
[im 28/34  brain]
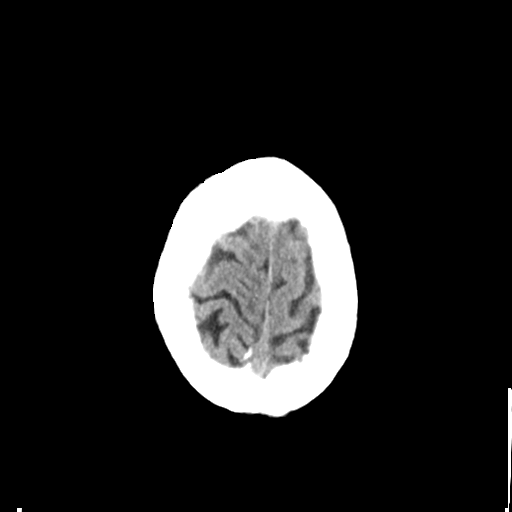
[im 31/34  brain]
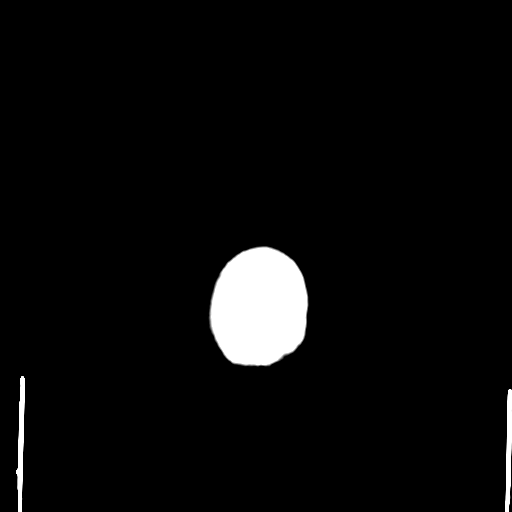
[im 31/34  bone]
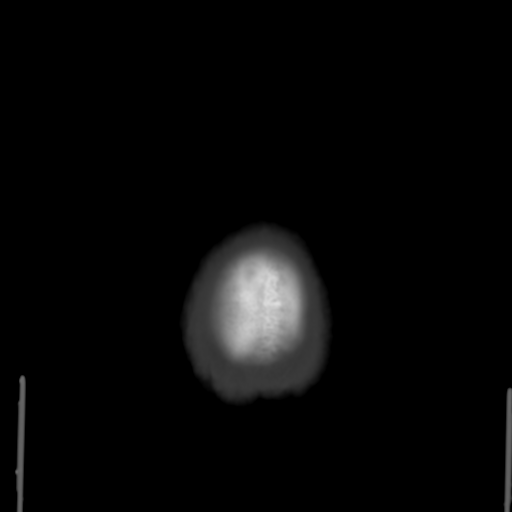

[Series 4: coronal soft tissue · coronal · 0.32mm/px · 3 of 78 slices shown]
[im 26/78  brain]
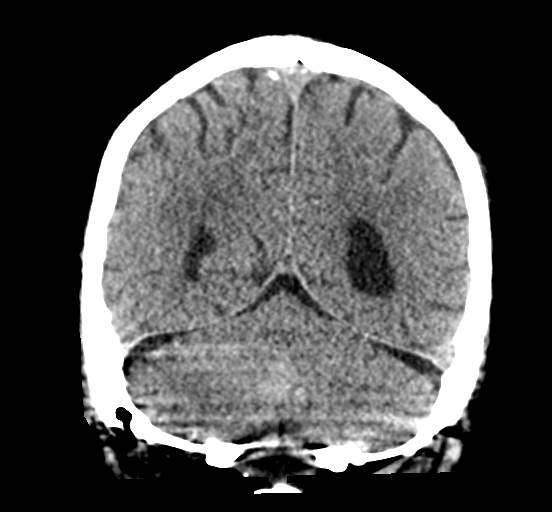
[im 35/78  brain]
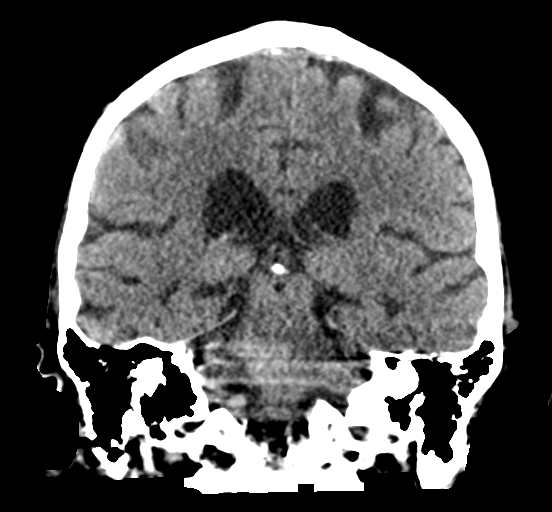
[im 43/78  brain]
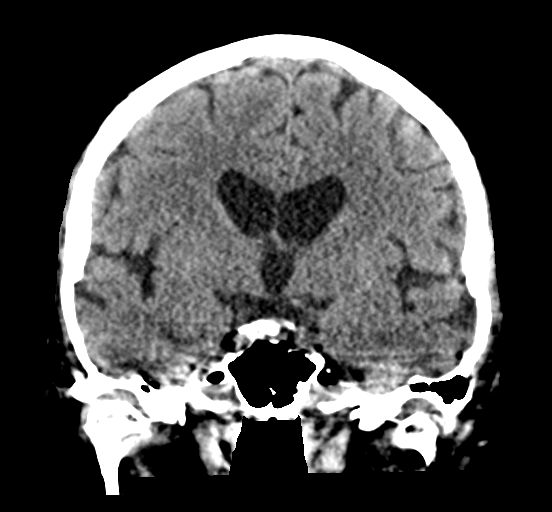

[Series 5: sagittal soft tissue · sagittal · 0.33mm/px · 3 of 58 slices shown]
[im 20/58  brain]
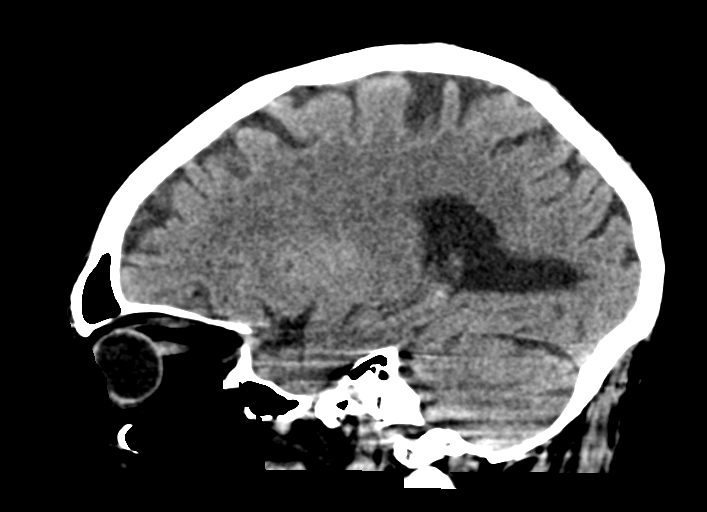
[im 29/58  brain]
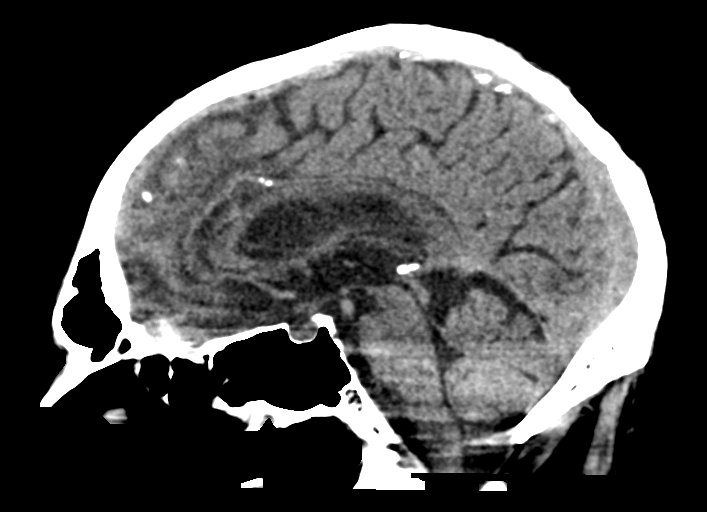
[im 39/58  brain]
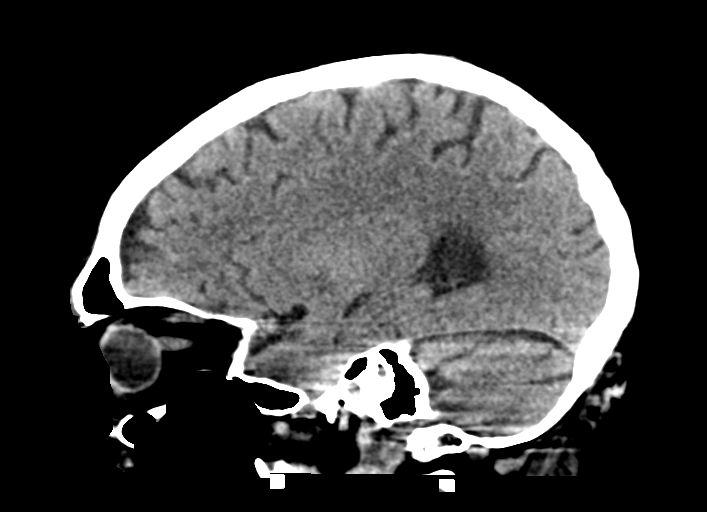

[15 of 47 positions shown; findings below may reference images not displayed]

FINDINGS: CT HEAD FINDINGS

Brain: There is no mass, hemorrhage or extra-axial collection. There
is generalized atrophy without lobar predilection. There is
hypoattenuation of the periventricular white matter, most commonly
indicating chronic ischemic microangiopathy.

Vascular: No hyperdense vessel or unexpected vascular calcification.

Skull: The visualized skull base, calvarium and extracranial soft
tissues are normal.

CT MAXILLOFACIAL FINDINGS

Osseous:

--Complex facial fracture types: No [place], zygomaticomaxillary
complex or nasoorbitoethmoidal fracture.

--Simple fracture types: None.

--Mandible, hard palate and teeth: No acute abnormality. Two areas
of cystic change in the maxilla at the roots of absent incisors.

Orbits: The globes and optic nerves are intact. Normal extraocular
muscles and intraorbital fat.

Sinuses: No acute finding.

Soft tissues: Normal visualized extracranial soft tissues.
IMPRESSION: 1. Chronic ischemic microangiopathy and generalized atrophy without
acute intracranial abnormality.
2. No facial fracture.

## 2023-02-05 IMAGING — RF DG ANKLE 2V *L*
1 series · 4 of 4 positions shown · non-contrast
Comparison: Radiograph 12/27/2020

CLINICAL DATA: Left ankle external fixation.

EXAM:
LEFT ANKLE - 2 VIEW; DG C-ARM 1-60 MIN

[Series 1: unknown protocol · 0.14mm/px · 4 of 4 slices shown]
[im 1/4]
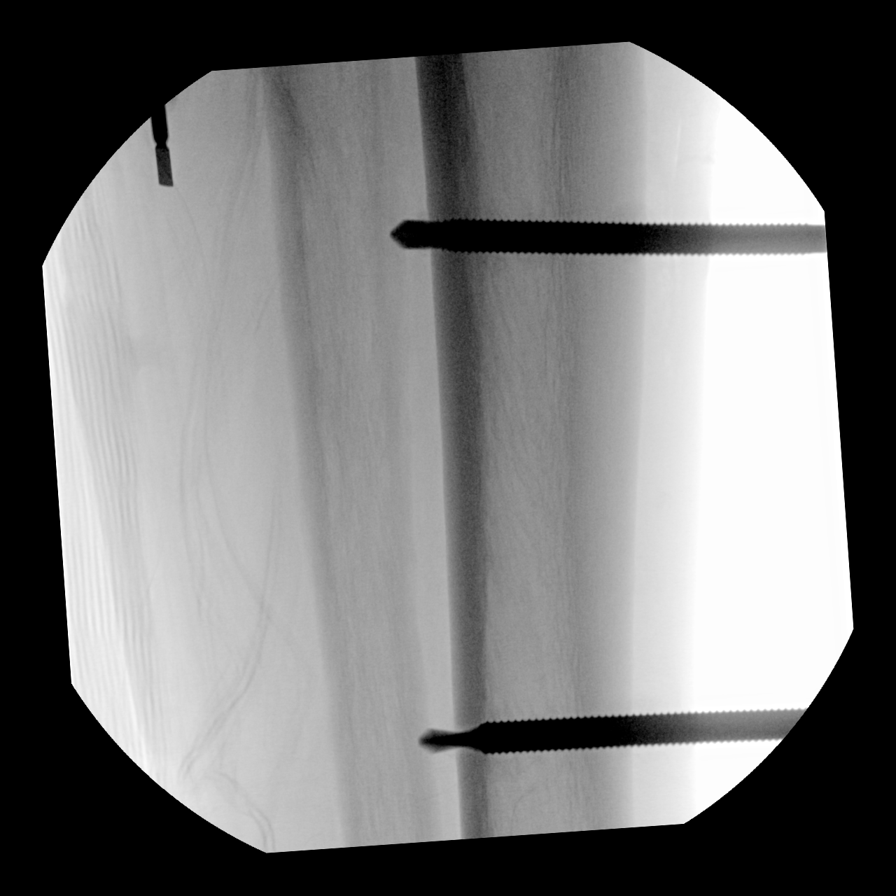
[im 2/4]
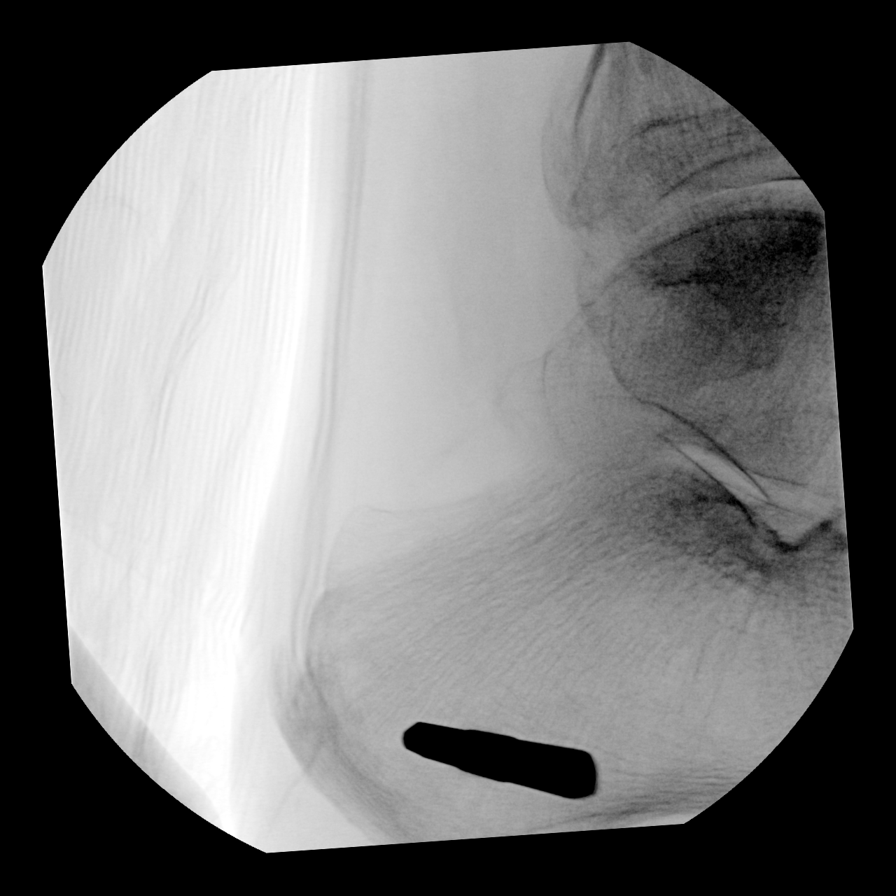
[im 3/4]
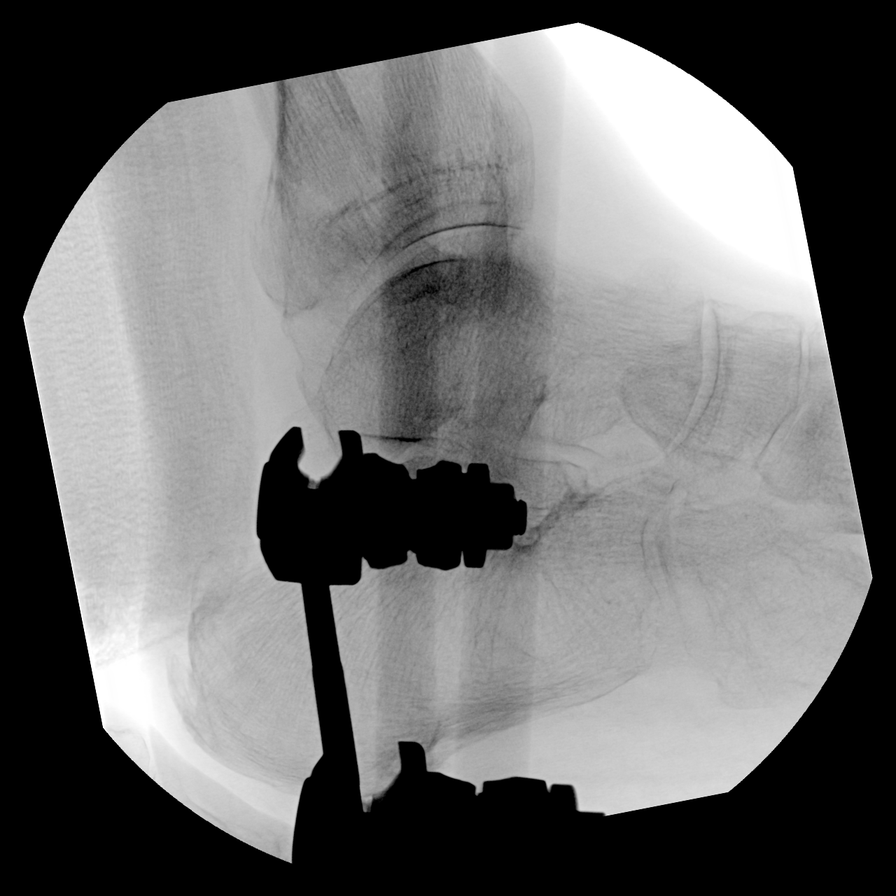
[im 4/4]
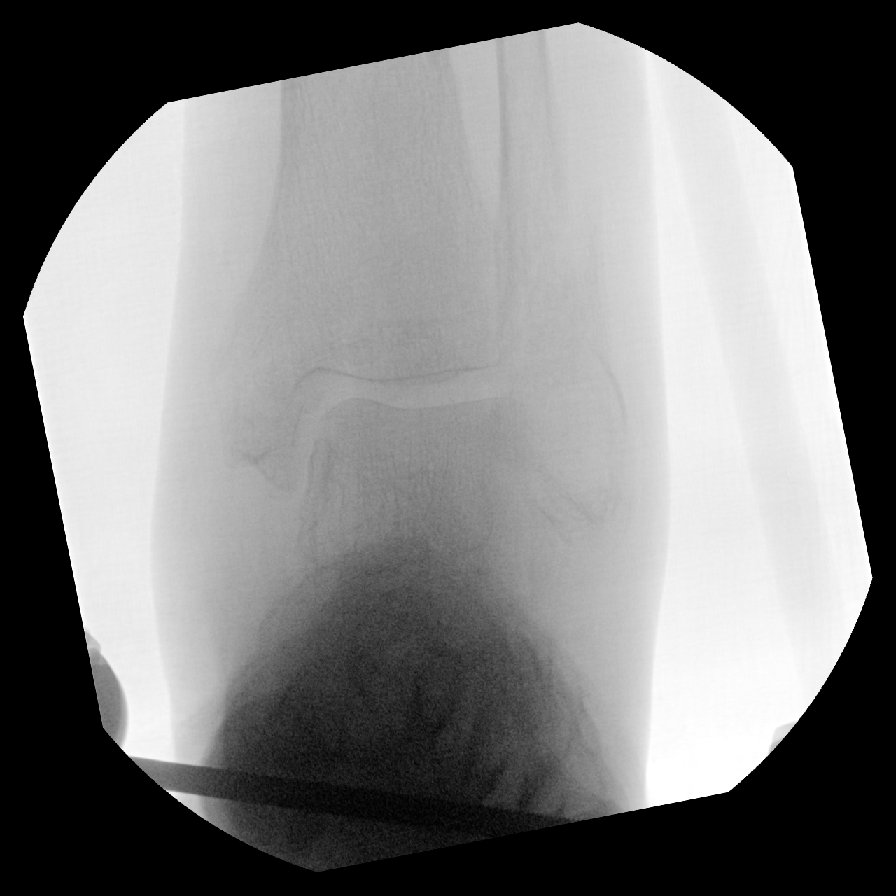

[4 of 4 positions shown; findings below may reference images not displayed]

FINDINGS: Four fluoroscopic spot views obtained in the operating room during
fixator placement. Pins in the tibia and calcaneus. Improved
alignment of distal tibia and fibular fractures. Fluoroscopy time
and dose not provided by technologist.
IMPRESSION: Intraoperative fluoroscopy during external fixator placement.

## 2023-02-14 IMAGING — DX DG ANKLE 2V *L*
2 series · 2 of 2 positions shown · non-contrast
Comparison: 12/29/2020.

CLINICAL DATA: Preoperative evaluation.

EXAM:
LEFT ANKLE - 2 VIEW

[ankle ap]
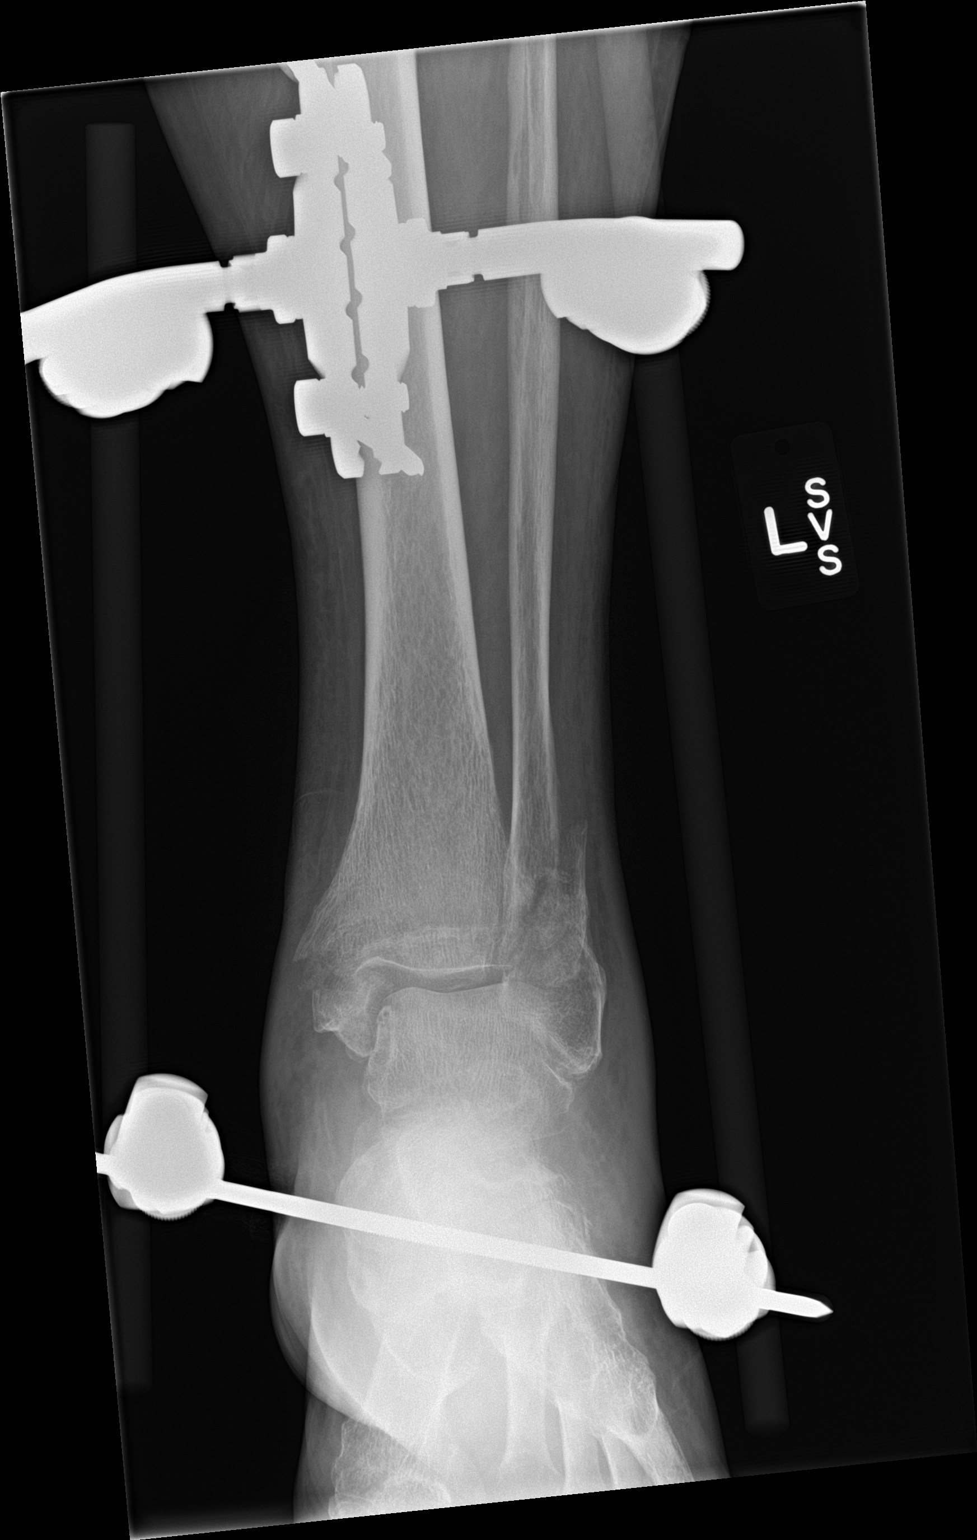

[ankle lat]
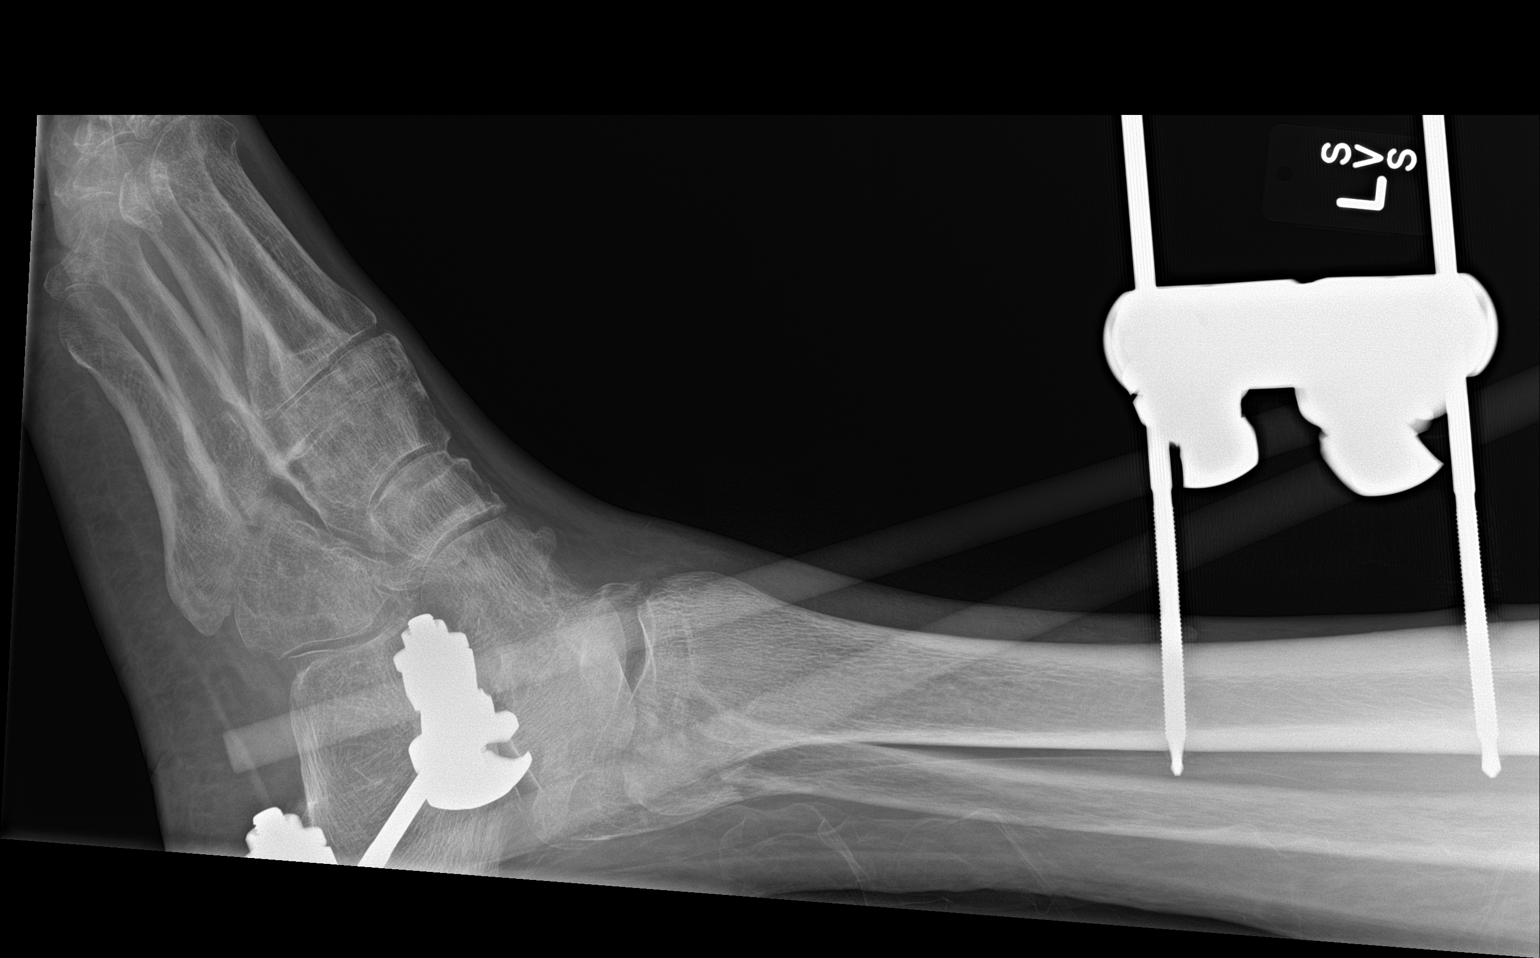

[2 of 2 positions shown; findings below may reference images not displayed]

FINDINGS: External fixation noted with hardware noted in the distal tibia and
calcaneus. Medial malleolar and distal fibular fractures again
noted.
IMPRESSION: External fixation left ankle.

## 2023-02-28 DIAGNOSIS — N289 Disorder of kidney and ureter, unspecified: Secondary | ICD-10-CM | POA: Diagnosis not present

## 2023-02-28 DIAGNOSIS — E119 Type 2 diabetes mellitus without complications: Secondary | ICD-10-CM | POA: Diagnosis not present

## 2023-02-28 DIAGNOSIS — E785 Hyperlipidemia, unspecified: Secondary | ICD-10-CM | POA: Diagnosis not present

## 2023-02-28 DIAGNOSIS — Z0000 Encounter for general adult medical examination without abnormal findings: Secondary | ICD-10-CM | POA: Diagnosis not present

## 2023-02-28 DIAGNOSIS — F419 Anxiety disorder, unspecified: Secondary | ICD-10-CM | POA: Diagnosis not present

## 2023-02-28 DIAGNOSIS — E039 Hypothyroidism, unspecified: Secondary | ICD-10-CM | POA: Diagnosis not present

## 2023-02-28 DIAGNOSIS — I10 Essential (primary) hypertension: Secondary | ICD-10-CM | POA: Diagnosis not present

## 2023-06-15 ENCOUNTER — Telehealth: Payer: Self-pay | Admitting: Family Medicine

## 2023-06-15 NOTE — Telephone Encounter (Signed)
Pt scheduled for COVID & Flu vaccines, he wants to know if it is possible to get the RSV that same visit. If someone from the clinic could please call him back to answer his question. Thanks.

## 2023-06-16 NOTE — Telephone Encounter (Signed)
Returned phone  call to client regarding vaccines.  Client opted to  get RSV at separate appointment.  Appointment scheduled for 07/10/2023.   He will receive FLU and COVID-19 on 07/03/2023.  Wendolyn Raso Sherrilyn Rist, RN

## 2023-07-03 ENCOUNTER — Ambulatory Visit: Payer: Medicare HMO

## 2023-07-03 DIAGNOSIS — Z23 Encounter for immunization: Secondary | ICD-10-CM | POA: Diagnosis not present

## 2023-07-03 DIAGNOSIS — Z719 Counseling, unspecified: Secondary | ICD-10-CM

## 2023-07-03 NOTE — Progress Notes (Signed)
In nurse clinic for immunizations. Patient requesting Covid Comirnaty 12+ and High-Dose Flu. Voices no concerns. VIS reviewed and given to patient. Vaccines tolerated well; no issues noted. Patient monitored for 15 minutes; no problems. [facility] updated and copy given to patient.  Abagail Kitchens, RN

## 2023-07-10 ENCOUNTER — Ambulatory Visit: Payer: Medicare HMO

## 2023-10-03 DIAGNOSIS — F32A Depression, unspecified: Secondary | ICD-10-CM | POA: Diagnosis not present

## 2023-10-03 DIAGNOSIS — E039 Hypothyroidism, unspecified: Secondary | ICD-10-CM | POA: Diagnosis not present

## 2023-10-03 DIAGNOSIS — I10 Essential (primary) hypertension: Secondary | ICD-10-CM | POA: Diagnosis not present

## 2023-10-03 DIAGNOSIS — E119 Type 2 diabetes mellitus without complications: Secondary | ICD-10-CM | POA: Diagnosis not present

## 2023-10-03 DIAGNOSIS — Z0000 Encounter for general adult medical examination without abnormal findings: Secondary | ICD-10-CM | POA: Diagnosis not present

## 2023-10-03 DIAGNOSIS — I129 Hypertensive chronic kidney disease with stage 1 through stage 4 chronic kidney disease, or unspecified chronic kidney disease: Secondary | ICD-10-CM | POA: Diagnosis not present

## 2023-10-03 DIAGNOSIS — E1122 Type 2 diabetes mellitus with diabetic chronic kidney disease: Secondary | ICD-10-CM | POA: Diagnosis not present

## 2023-10-03 DIAGNOSIS — Z87891 Personal history of nicotine dependence: Secondary | ICD-10-CM | POA: Diagnosis not present

## 2023-10-03 DIAGNOSIS — E785 Hyperlipidemia, unspecified: Secondary | ICD-10-CM | POA: Diagnosis not present

## 2023-10-03 DIAGNOSIS — M549 Dorsalgia, unspecified: Secondary | ICD-10-CM | POA: Diagnosis not present

## 2023-10-03 DIAGNOSIS — F419 Anxiety disorder, unspecified: Secondary | ICD-10-CM | POA: Diagnosis not present

## 2023-10-03 DIAGNOSIS — Z1331 Encounter for screening for depression: Secondary | ICD-10-CM | POA: Diagnosis not present

## 2023-10-04 DIAGNOSIS — H26492 Other secondary cataract, left eye: Secondary | ICD-10-CM | POA: Diagnosis not present

## 2023-10-04 DIAGNOSIS — H26491 Other secondary cataract, right eye: Secondary | ICD-10-CM | POA: Diagnosis not present

## 2023-10-04 DIAGNOSIS — E119 Type 2 diabetes mellitus without complications: Secondary | ICD-10-CM | POA: Diagnosis not present

## 2023-10-04 DIAGNOSIS — Z961 Presence of intraocular lens: Secondary | ICD-10-CM | POA: Diagnosis not present

## 2023-10-04 DIAGNOSIS — H26493 Other secondary cataract, bilateral: Secondary | ICD-10-CM | POA: Diagnosis not present

## 2023-10-13 DIAGNOSIS — H26492 Other secondary cataract, left eye: Secondary | ICD-10-CM | POA: Diagnosis not present

## 2023-10-13 DIAGNOSIS — H35373 Puckering of macula, bilateral: Secondary | ICD-10-CM | POA: Diagnosis not present

## 2024-04-01 DIAGNOSIS — I10 Essential (primary) hypertension: Secondary | ICD-10-CM | POA: Diagnosis not present

## 2024-04-01 DIAGNOSIS — F419 Anxiety disorder, unspecified: Secondary | ICD-10-CM | POA: Diagnosis not present

## 2024-04-01 DIAGNOSIS — E039 Hypothyroidism, unspecified: Secondary | ICD-10-CM | POA: Diagnosis not present

## 2024-04-01 DIAGNOSIS — N289 Disorder of kidney and ureter, unspecified: Secondary | ICD-10-CM | POA: Diagnosis not present

## 2024-04-01 DIAGNOSIS — E119 Type 2 diabetes mellitus without complications: Secondary | ICD-10-CM | POA: Diagnosis not present

## 2024-04-01 DIAGNOSIS — E785 Hyperlipidemia, unspecified: Secondary | ICD-10-CM | POA: Diagnosis not present

## 2024-06-27 DIAGNOSIS — Z23 Encounter for immunization: Secondary | ICD-10-CM | POA: Diagnosis not present
